# Patient Record
Sex: Female | Born: 1960 | Race: Black or African American | Hispanic: No | Marital: Single | Smoking: Current every day smoker
Health system: Southern US, Community
[De-identification: ages and names within clinical notes are randomized; demographics above are authoritative.]

## PROBLEM LIST (undated history)

## (undated) DIAGNOSIS — D649 Anemia, unspecified: Secondary | ICD-10-CM

## (undated) DIAGNOSIS — G8929 Other chronic pain: Secondary | ICD-10-CM

## (undated) DIAGNOSIS — F32A Depression, unspecified: Secondary | ICD-10-CM

## (undated) DIAGNOSIS — M25519 Pain in unspecified shoulder: Secondary | ICD-10-CM

## (undated) DIAGNOSIS — F419 Anxiety disorder, unspecified: Secondary | ICD-10-CM

## (undated) DIAGNOSIS — F329 Major depressive disorder, single episode, unspecified: Secondary | ICD-10-CM

## (undated) DIAGNOSIS — I10 Essential (primary) hypertension: Secondary | ICD-10-CM

## (undated) DIAGNOSIS — M549 Dorsalgia, unspecified: Secondary | ICD-10-CM

## (undated) DIAGNOSIS — E785 Hyperlipidemia, unspecified: Secondary | ICD-10-CM

## (undated) HISTORY — DX: Anxiety disorder, unspecified: F41.9

## (undated) HISTORY — DX: Pain in unspecified shoulder: M25.519

## (undated) HISTORY — DX: Essential (primary) hypertension: I10

## (undated) HISTORY — DX: Hyperlipidemia, unspecified: E78.5

## (undated) HISTORY — DX: Major depressive disorder, single episode, unspecified: F32.9

## (undated) HISTORY — DX: Other chronic pain: G89.29

## (undated) HISTORY — DX: Anemia, unspecified: D64.9

## (undated) HISTORY — DX: Depression, unspecified: F32.A

---

## 1989-09-13 HISTORY — PX: TUBAL LIGATION: SHX77

## 1994-09-13 HISTORY — PX: BREAST REDUCTION SURGERY: SHX8

## 1999-02-10 ENCOUNTER — Encounter: Payer: Self-pay | Admitting: Family Medicine

## 1999-02-10 ENCOUNTER — Ambulatory Visit (HOSPITAL_COMMUNITY): Admission: RE | Admit: 1999-02-10 | Discharge: 1999-02-10 | Payer: Self-pay | Admitting: Family Medicine

## 2002-09-13 HISTORY — PX: ROTATOR CUFF REPAIR: SHX139

## 2003-09-14 HISTORY — PX: ROTATOR CUFF REPAIR: SHX139

## 2005-05-12 ENCOUNTER — Encounter: Payer: Self-pay | Admitting: Internal Medicine

## 2005-09-13 HISTORY — PX: ROTATOR CUFF REPAIR: SHX139

## 2009-05-24 ENCOUNTER — Emergency Department (HOSPITAL_COMMUNITY): Admission: EM | Admit: 2009-05-24 | Discharge: 2009-05-25 | Payer: Self-pay | Admitting: Emergency Medicine

## 2009-05-26 ENCOUNTER — Encounter: Payer: Self-pay | Admitting: Internal Medicine

## 2009-05-27 ENCOUNTER — Encounter: Payer: Self-pay | Admitting: Internal Medicine

## 2009-05-27 ENCOUNTER — Observation Stay (HOSPITAL_COMMUNITY): Admission: EM | Admit: 2009-05-27 | Discharge: 2009-05-27 | Payer: Self-pay | Admitting: Emergency Medicine

## 2009-06-11 ENCOUNTER — Ambulatory Visit: Payer: Self-pay | Admitting: Infectious Diseases

## 2009-06-11 ENCOUNTER — Encounter: Payer: Self-pay | Admitting: Internal Medicine

## 2009-06-11 ENCOUNTER — Ambulatory Visit: Payer: Self-pay | Admitting: Internal Medicine

## 2009-06-11 DIAGNOSIS — Z9889 Other specified postprocedural states: Secondary | ICD-10-CM

## 2009-06-11 DIAGNOSIS — E119 Type 2 diabetes mellitus without complications: Secondary | ICD-10-CM | POA: Insufficient documentation

## 2009-06-11 DIAGNOSIS — I10 Essential (primary) hypertension: Secondary | ICD-10-CM | POA: Insufficient documentation

## 2009-06-11 LAB — CONVERTED CEMR LAB

## 2009-06-16 ENCOUNTER — Ambulatory Visit (HOSPITAL_COMMUNITY): Admission: RE | Admit: 2009-06-16 | Discharge: 2009-06-16 | Payer: Self-pay | Admitting: Internal Medicine

## 2009-06-16 LAB — HM MAMMOGRAPHY: HM Mammogram: NEGATIVE

## 2009-06-25 ENCOUNTER — Telehealth (INDEPENDENT_AMBULATORY_CARE_PROVIDER_SITE_OTHER): Payer: Self-pay | Admitting: *Deleted

## 2009-06-25 ENCOUNTER — Ambulatory Visit: Payer: Self-pay | Admitting: Internal Medicine

## 2009-07-01 ENCOUNTER — Ambulatory Visit: Payer: Self-pay | Admitting: Internal Medicine

## 2009-07-01 ENCOUNTER — Encounter: Payer: Self-pay | Admitting: Internal Medicine

## 2009-07-08 LAB — CONVERTED CEMR LAB
AST: 16 units/L (ref 0–37)
Alkaline Phosphatase: 70 units/L (ref 39–117)
BUN: 10 mg/dL (ref 6–23)
Cholesterol: 218 mg/dL — ABNORMAL HIGH (ref 0–200)
Microalb Creat Ratio: 4.1 mg/g (ref 0.0–30.0)
TSH: 0.655 microintl units/mL (ref 0.350–4.5)
Total Protein: 7.3 g/dL (ref 6.0–8.3)

## 2009-07-10 ENCOUNTER — Telehealth: Payer: Self-pay | Admitting: Internal Medicine

## 2009-07-24 ENCOUNTER — Ambulatory Visit: Payer: Self-pay | Admitting: Infectious Disease

## 2009-07-24 DIAGNOSIS — F329 Major depressive disorder, single episode, unspecified: Secondary | ICD-10-CM

## 2009-07-24 LAB — CONVERTED CEMR LAB: Hgb A1c MFr Bld: 6.6 %

## 2009-08-26 ENCOUNTER — Encounter: Payer: Self-pay | Admitting: Internal Medicine

## 2009-08-29 ENCOUNTER — Ambulatory Visit: Payer: Self-pay | Admitting: Infectious Diseases

## 2009-09-30 ENCOUNTER — Ambulatory Visit: Payer: Self-pay | Admitting: Internal Medicine

## 2009-09-30 ENCOUNTER — Encounter: Payer: Self-pay | Admitting: Internal Medicine

## 2009-10-02 ENCOUNTER — Encounter: Payer: Self-pay | Admitting: Internal Medicine

## 2009-10-21 ENCOUNTER — Encounter: Admission: RE | Admit: 2009-10-21 | Discharge: 2009-10-21 | Payer: Self-pay | Admitting: Internal Medicine

## 2009-11-03 ENCOUNTER — Telehealth: Payer: Self-pay | Admitting: Internal Medicine

## 2009-11-10 ENCOUNTER — Encounter: Payer: Self-pay | Admitting: Internal Medicine

## 2009-11-11 ENCOUNTER — Ambulatory Visit: Payer: Self-pay | Admitting: Internal Medicine

## 2009-11-11 LAB — CONVERTED CEMR LAB
Blood Glucose, Fingerstick: 96
Hgb A1c MFr Bld: 6.6 %

## 2009-12-24 ENCOUNTER — Encounter: Payer: Self-pay | Admitting: Internal Medicine

## 2009-12-24 ENCOUNTER — Ambulatory Visit: Payer: Self-pay | Admitting: Internal Medicine

## 2010-01-07 ENCOUNTER — Telehealth: Payer: Self-pay | Admitting: Internal Medicine

## 2010-01-14 ENCOUNTER — Ambulatory Visit: Payer: Self-pay | Admitting: Internal Medicine

## 2010-01-27 ENCOUNTER — Encounter: Payer: Self-pay | Admitting: Internal Medicine

## 2010-02-13 ENCOUNTER — Ambulatory Visit: Payer: Self-pay | Admitting: Internal Medicine

## 2010-02-13 DIAGNOSIS — E785 Hyperlipidemia, unspecified: Secondary | ICD-10-CM | POA: Insufficient documentation

## 2010-03-17 ENCOUNTER — Telehealth: Payer: Self-pay | Admitting: *Deleted

## 2010-03-17 ENCOUNTER — Ambulatory Visit: Payer: Self-pay | Admitting: Internal Medicine

## 2010-03-17 DIAGNOSIS — M25569 Pain in unspecified knee: Secondary | ICD-10-CM

## 2010-03-17 LAB — CONVERTED CEMR LAB
Calcium: 9.3 mg/dL (ref 8.4–10.5)
Cholesterol: 182 mg/dL (ref 0–200)
Creatinine, Ser: 0.65 mg/dL (ref 0.40–1.20)
Glucose, Bld: 159 mg/dL — ABNORMAL HIGH (ref 70–99)
HDL: 35 mg/dL — ABNORMAL LOW (ref >39)
Potassium: 3.8 meq/L (ref 3.5–5.3)
Sodium: 138 meq/L (ref 135–145)
Total CHOL/HDL Ratio: 5.2
Triglycerides: 168 mg/dL — ABNORMAL HIGH (ref <150)

## 2010-04-15 ENCOUNTER — Ambulatory Visit: Payer: Self-pay | Admitting: Internal Medicine

## 2010-04-15 ENCOUNTER — Encounter: Payer: Self-pay | Admitting: Internal Medicine

## 2010-04-20 ENCOUNTER — Telehealth: Payer: Self-pay | Admitting: Licensed Clinical Social Worker

## 2010-04-22 ENCOUNTER — Emergency Department (HOSPITAL_COMMUNITY): Admission: EM | Admit: 2010-04-22 | Discharge: 2010-04-22 | Payer: Self-pay | Admitting: Emergency Medicine

## 2010-04-28 ENCOUNTER — Encounter: Payer: Self-pay | Admitting: Internal Medicine

## 2010-04-30 ENCOUNTER — Emergency Department (HOSPITAL_COMMUNITY): Admission: EM | Admit: 2010-04-30 | Discharge: 2010-04-30 | Payer: Self-pay | Admitting: Emergency Medicine

## 2010-05-20 ENCOUNTER — Telehealth: Payer: Self-pay | Admitting: *Deleted

## 2010-05-21 ENCOUNTER — Encounter: Payer: Self-pay | Admitting: Internal Medicine

## 2010-06-02 ENCOUNTER — Ambulatory Visit: Payer: Self-pay | Admitting: Internal Medicine

## 2010-06-06 ENCOUNTER — Emergency Department (HOSPITAL_COMMUNITY): Admission: EM | Admit: 2010-06-06 | Discharge: 2010-06-06 | Payer: Self-pay | Admitting: Emergency Medicine

## 2010-06-12 ENCOUNTER — Telehealth: Payer: Self-pay | Admitting: *Deleted

## 2010-06-16 ENCOUNTER — Telehealth: Payer: Self-pay | Admitting: Internal Medicine

## 2010-06-18 ENCOUNTER — Encounter: Admission: RE | Admit: 2010-06-18 | Discharge: 2010-06-18 | Payer: Self-pay | Admitting: Unknown Physician Specialty

## 2010-06-18 ENCOUNTER — Encounter: Payer: Self-pay | Admitting: Internal Medicine

## 2010-06-22 ENCOUNTER — Ambulatory Visit: Payer: Self-pay | Admitting: Internal Medicine

## 2010-06-22 ENCOUNTER — Encounter: Payer: Self-pay | Admitting: Internal Medicine

## 2010-06-23 LAB — CONVERTED CEMR LAB
Benzodiazepines.: NEGATIVE
Cocaine Metabolites: NEGATIVE
Creatinine,U: 25.5 mg/dL
Marijuana Metabolite: NEGATIVE
Methadone: NEGATIVE

## 2010-06-30 ENCOUNTER — Encounter: Payer: Self-pay | Admitting: Internal Medicine

## 2010-08-10 ENCOUNTER — Encounter: Payer: Self-pay | Admitting: Internal Medicine

## 2010-08-17 ENCOUNTER — Telehealth: Payer: Self-pay | Admitting: Internal Medicine

## 2010-08-24 ENCOUNTER — Ambulatory Visit: Payer: Self-pay | Admitting: Internal Medicine

## 2010-08-24 LAB — CONVERTED CEMR LAB
ALT: 8 units/L (ref 0–35)
AST: 13 units/L (ref 0–37)
Albumin: 4.2 g/dL (ref 3.5–5.2)
Blood Glucose, Fingerstick: 103
Calcium: 9 mg/dL (ref 8.4–10.5)
Hgb A1c MFr Bld: 7 %

## 2010-09-21 ENCOUNTER — Telehealth: Payer: Self-pay | Admitting: Internal Medicine

## 2010-09-22 ENCOUNTER — Encounter: Payer: Self-pay | Admitting: Internal Medicine

## 2010-10-04 ENCOUNTER — Encounter: Payer: Self-pay | Admitting: Internal Medicine

## 2010-10-08 ENCOUNTER — Encounter: Payer: Self-pay | Admitting: *Deleted

## 2010-10-13 NOTE — Letter (Signed)
Summary: [facility]  [facility]   Imported By: Shon Hough 08/11/2010 11:39:31  _____________________________________________________________________  External Attachment:    Type:   Image     Comment:   External Document

## 2010-10-13 NOTE — Assessment & Plan Note (Signed)
Summary: SHOULDER/SB.   Vital Signs:  Patient profile:   50 year old female Height:      65 inches Temp:     97.6 degrees F oral BP sitting:   135 / 79  (right arm)  Vitals Entered By: Filomena Jungling NT II (June 02, 2010 9:24 AM) CC: NUMBNESS AND TINGLING IN RIGHT HAND  Is Patient Diabetic? Yes Did you bring your meter with you today? No Pain Assessment Patient in pain? yes     Location: SHOULDERS Intensity: 6 Type: aching Onset of pain  Chronic CBG Result 219  Does patient need assistance? Functional Status Self care Ambulation Normal   Primary Care Provider:  Lars Mage MD  CC:  NUMBNESS AND TINGLING IN RIGHT HAND .  History of Present Illness: Ms. 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 It started about one month ago, and she states has been getting increasingly bothersome. Pt is on percocet for pain management. Pt is on 60 tab per month, and states she has to take 3 per day as opposed to 2 per day. Pt is also on neurontin which was not on EMR, however pt states she takes 3 per day and helps the numbness to some degree. Pt also has had hx of steroid injections in the past and states it only helps for a couple of days.  2) Depression - pt was given a prescription for celexa, however she never took it. She states her depression stems from her pain, and does not believe she is actually depressed.     Preventive Screening-Counseling & Management  Alcohol-Tobacco     Alcohol drinks/day: 0     Smoking Status: current     Smoking Cessation Counseling: yes     Packs/Day: 2-3 cigs per day     Year Started: 1990     Pack years: 20  Caffeine-Diet-Exercise     Does Patient Exercise: yes     Type of exercise: walking- dogs  Current Medications (verified): 1)   Lisinopril 10 Mg Tabs (Lisinopril) .... Take 1 Tablet By Mouth Once A Day 2)  Pravachol 40 Mg Tabs (Pravastatin Sodium) .... Take 1 Pill By Mouth Daily. 3)  Metformin Hcl 500 Mg Tabs (Metformin Hcl) .... Take 1 Tablet By Mouth Two Times A Day 4)  Truetrack Test  Strp (Glucose Blood) .... Use To Check Blood Sugar 2 Hours After Meals 5)  Lancets  Misc (Lancets) .... Use To Check Blood Sugars 2 Hours After Meals 6)  Vitamin B-12 500 Mcg Tabs (Cyanocobalamin) .... Take 1 Tablet By Mouth Once A Day 7)  Percocet 7.5-500 Mg Tabs (Oxycodone-Acetaminophen) .... Take 1 Tablet Every 8 Hours As Needed For Pain 8)  Neurontin 100 Mg Caps (Gabapentin) .... Take 1 Tablet By Mouth Three Times A Day  Allergies (verified): 1)  ! Keflex  Past History:  Past Medical History: Last updated: 02/13/2010 Diabetes, well controlled on metformin HTN HLD chronic shoulder pain  Past Surgical History: Last updated: 04/15/2010 rotator cuff repair in 2004, 2005; left 2007  Family History: Last updated: 10-19-09 Mother died of cervical cancer father died of Stroke at age 58.  Social History: Last updated: 06/02/2010 Recently moved to [place] from [place]. 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 Single Current Smoker - 2  cig per day Drug use-no on disability Completed CNA and cosmetology courses after high school  Risk Factors: Alcohol Use: 0 (06/02/2010) Exercise: yes (06/02/2010)  Risk Factors: Smoking Status: current (06/02/2010) Packs/Day: 2-3 cigs per day (06/02/2010)  Social History: Recently moved to [place] from [place]. 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 Single Current Smoker - 2 cig per day Drug use-no on disability Completed CNA and cosmetology courses after high school  Review of Systems      See HPI  Physical Exam  General:  alert and well-developed.   Head:  normocephalic.   Neck:  supple.   Lungs:  normal respiratory effort and normal breath sounds.   Heart:   normal rate, regular rhythm, no murmur, no gallop, and no rub.   Abdomen:  soft, non-tender, and normal bowel sounds.   Msk:  decreased range of motion secondary to pain, cannot lift arms above head  Pulses:  pulses are equal 2+ DP Extremities:  no LE edema noted  Neurologic:  alert & oriented X3, cranial nerves II-XII intact, strength normal in all extremities, and sensation intact to light touch.    Diabetes Management Exam:    Foot Exam (with socks and/or shoes not present):       Sensory-Pinprick/Light touch:          Left medial foot (L-4): normal          Left dorsal foot (L-5): normal          Left lateral foot (S-1): normal          Right medial foot (L-4): normal          Right dorsal foot (L-5): normal          Right lateral foot (S-1): normal       Sensory-Monofilament:          Left foot: normal          Right foot: normal       Inspection:          Left foot: normal          Right foot: normal       Nails:          Left foot: normal          Right foot: normal   Impression & Recommendations:  Problem # 1:  ROTATOR CUFF REPAIR, HX OF (ICD-V45.89) Pt has significant history of bilateral rotator cuff tear s/p repair. Pt is followed by Dr. Ranell Patrick. Per pt, she states that Dr. Ranell Patrick states she needs a major surgery that would require altering bone in shoulder, and pt is not willing to pursue this procedure. She states she would like pain control only, and feels it is not sufficiently controlled and would like referral to pain management clinic. Will refer patient to pain management clinic. With regards to numbness and tingling symptoms, pt reports taking amitriptyline in past without relief, and gabapentin only relieves symptoms to some extent. Will double her dose of gabapentin (renal function is adequate) and start pt on trazodone for sleep aid.  Continue with percocet as well.   Orders: Pain Clinic Referral (Pain)  Problem # 2:  ESSENTIAL HYPERTENSION, BENIGN  (ICD-401.1) Blood pressure is at goal, continue current regimen. Renal function recently checked and at goal.   Her updated medication list for this problem includes:    Lisinopril 10 Mg Tabs (Lisinopril) .Marland Kitchen... Take 1 tablet by mouth once a day  BP today: 135/79 Prior BP: 134/89 (04/15/2010)  Labs Reviewed: K+: 3.8 (03/17/2010) Creat: : 0.65 (03/17/2010)   Chol: 182 (03/17/2010)   HDL: 35 (03/17/2010)   LDL: 113 (03/17/2010)   TG: 168 (03/17/2010)  Problem # 3:  HYPERLIPIDEMIA (ICD-272.4) Improved, continue current regimen.   Her updated medication list for this problem includes:    Pravachol 40 Mg Tabs (Pravastatin sodium) .Marland Kitchen... Take 1 pill by mouth daily.  Labs Reviewed: SGOT: 16 (07/01/2009)   SGPT: 13 (07/01/2009)   HDL:35 (03/17/2010), 35 (07/01/2009)  LDL:113 (03/17/2010), 139 (07/01/2009)  Chol:182 (03/17/2010), 218 (07/01/2009)  Trig:168 (03/17/2010), 220 (07/01/2009)  Problem # 4:  DM (ICD-250.00) No changes with regards to A1C. Continue current regimen. Pt has eye exam next month. Foot exam wnl.   Her updated medication list for this problem includes:    Lisinopril 10 Mg Tabs (Lisinopril) .Marland Kitchen... Take 1 tablet by mouth once a day    Metformin Hcl 500 Mg Tabs (Metformin hcl) .Marland Kitchen... Take 1 tablet by mouth two times a day  Orders: T- Capillary Blood Glucose (81191) T-Hgb A1C (in-house) ([id number])  Labs Reviewed: Creat: 0.65 (03/17/2010)    Reviewed HgBA1c results: 6.9 (02/13/2010)  6.6 (11/11/2009)  Problem # 5:  PREVENTIVE HEALTH CARE (ICD-V70.0) Flu shot GYN referral for PAP.   Problem # 6:  DEPRESSION, MAJOR (ICD-296.20) Pt states she feels more anxious and worried about her level of pain, and does not feel she is depressed. In reviewing her meds, pt has been on amitriptyline, paxil, fluoxetine, and celexa without relief or improvement in symptoms. Recently she does not even take any meds prescribed for depression. I explained to patient that anxiety can be treated  with an anti-depressant such as the ones she has been on in the past. Pt is not interested in anti-depressants currently. Thus will control symptoms, with increased gabapentin dose, percocet, and trazodone for sleep aid.   Complete Medication List: 1)  Lisinopril 10 Mg Tabs (Lisinopril) .... Take 1 tablet by mouth once a day 2)  Pravachol 40 Mg Tabs (Pravastatin sodium) .... Take 1 pill by mouth daily. 3)  Metformin Hcl 500 Mg Tabs (Metformin hcl) .... Take 1 tablet by mouth two times a day 4)  Truetrack Test Strp (Glucose blood) .... Use to check blood sugar 2 hours after meals 5)  Lancets Misc (Lancets) .... Use to check blood sugars 2 hours after meals 6)  Vitamin B-12 500 Mcg Tabs (Cyanocobalamin) .... Take 1 tablet by mouth once a day 7)  Percocet 7.5-500 Mg Tabs (Oxycodone-acetaminophen) .... Take 1 tablet every 8 hours as needed for pain 8)  Gabapentin 300 Mg Caps (Gabapentin) .... Take 1 tablet by mouth two times a day 9)  Trazodone Hcl 50 Mg Tabs (Trazodone hcl) .... Take 1 tab by mouth at bedtime  Other Orders: Gynecologic Referral (Gyn)  Patient Instructions: 1)  Please schedule a follow-up appointment in 3 months. Prescriptions: TRAZODONE HCL 50 MG TABS (TRAZODONE HCL) Take 1 tab by mouth at bedtime  #31 x 0   Entered and Authorized by:   Melida Quitter MD   Signed by:   Melida Quitter MD on 06/02/2010   Method used:   Electronically to        [facility] (retail)       [address]       [place], [place]  [address]       Ph: [phone]       Fax: [phone]   RxID:  [id number] GABAPENTIN 300 MG CAPS (GABAPENTIN) Take 1 tablet by mouth two times a day  #60 x 6   Entered and Authorized by:   Melida Quitter MD   Signed by:   Melida Quitter MD on 06/02/2010   Method used:   Electronically to        [facility] (retail)       [address]       [place], [place]  [address]       Ph: [phone]        Fax: [phone]   RxID:   [id number]   Prevention & Chronic Care Immunizations   Influenza vaccine: Not documented   Influenza vaccine deferral: Not indicated  (12/24/2009)   Influenza vaccine due: 05/14/2010    Tetanus booster: Not documented   Td booster deferral: Not indicated  (06/11/2009)   Tetanus booster due: 04/15/2020    Pneumococcal vaccine: Not documented  Other Screening   Pap smear: Not documented   Pap smear action/deferral: GYN Referral  (06/02/2010)    Mammogram: ASSESSMENT: Negative - BI-RADS 1^MM DIGITAL SCREENING  (06/16/2009)   Mammogram action/deferral: Ordered  (06/11/2009)   Smoking status: current  (06/02/2010)   Smoking cessation counseling: yes  (06/02/2010)  Diabetes Mellitus   HgbA1C: 6.9  (06/02/2010)   Hemoglobin A1C due: 10/16/2010    Eye exam: Not documented   Diabetic eye exam action/deferral: Ophthalmology referral  (04/15/2010)    Foot exam: yes  (06/02/2010)   Foot exam action/deferral: Do today   High risk foot: No  (04/15/2010)   Foot care education: Done  (04/15/2010)    Urine microalbumin/creatinine ratio: 4.1  (07/01/2009)   Urine microalbumin action/deferral: Deferred    Diabetes flowsheet reviewed?: Yes   Progress toward A1C goal: At goal  Lipids   Total Cholesterol: 182  (03/17/2010)   Lipid panel action/deferral: LDL Direct Ordered   LDL: 644  (03/17/2010)   LDL Direct: Not documented   HDL: 35  (03/17/2010)   Triglycerides: 168  (03/17/2010)    SGOT (AST): 16  (07/01/2009)   SGPT (ALT): 13  (07/01/2009)   Alkaline phosphatase: 70  (07/01/2009)   Total bilirubin: 0.3  (07/01/2009)    Lipid flowsheet reviewed?: Yes   Progress toward LDL goal: Improved  Hypertension   Last Blood Pressure: 135 / 79  (06/02/2010)   Serum creatinine: 0.65  (03/17/2010)   BMP action: Deferred   Serum potassium 3.8  (03/17/2010)    Hypertension flowsheet reviewed?: Yes   Progress toward BP goal: At goal  Self-Management  Support :   Personal Goals (by the next clinic visit) :     Personal A1C goal: 7  (06/11/2009)     Personal blood pressure goal: 140/90  (06/25/2009)     Personal LDL goal: 100  (06/11/2009)    Patient will work on the following items until the next clinic visit to reach self-care goals:     Medications and monitoring: take my medicines every day, check my blood sugar, examine my feet every day  (06/02/2010)     Eating: drink diet soda or water instead of juice or soda, eat more vegetables, use fresh or frozen vegetables, eat foods that are low in salt, eat baked foods instead of fried foods, eat fruit for snacks and desserts  (06/02/2010)     Activity: take a 30 minute walk every day  (06/02/2010)    Diabetes self-management support: Education handout,  Resources for patients handout  (06/02/2010)   Diabetes education handout printed   Last diabetes self-management training by diabetes educator: 06/11/2009    Hypertension self-management support: Education handout, Resources for patients handout  (06/02/2010)   Hypertension education handout printed    Lipid self-management support: Education handout, Resources for patients handout  (06/02/2010)     Lipid education handout printed      Resource handout printed.   Nursing Instructions: Give Flu vaccine today Gyn referral for screening Pap (see order)      Laboratory Results   Blood Tests   Date/Time Received: June 02, 2010 10:02 AM  Date/Time Reported: Burke Keels  June 02, 2010 10:02 AM   HGBA1C: 6.9%   (Normal Range: Non-Diabetic - 3-6%   Control Diabetic - 6-8%) CBG Random:: 219mg /dL  Comments: Patient had [employer] Burke Keels  June 02, 2010 10:03 AM      Appended Document: SHOULDER/SB.   Influenza Vaccine    Vaccine Type: Fluvax MCR    Site: right deltoid    Mfr: GlaxoSmithKline    Dose: 0.5 ml    Route: IM    Given by: Chinita Pester RN    Exp. Date: 03/13/2011    Lot #:  [id number]    VIS given: 04/07/10 version given June 02, 2010.  Flu Vaccine Consent Questions    Do you have a history of severe allergic reactions to this vaccine? no    Any prior history of allergic reactions to egg and/or gelatin? no    Do you have a sensitivity to the preservative Thimersol? no    Do you have a past history of Guillan-Barre Syndrome? no    Do you currently have an acute febrile illness? no    Have you ever had a severe reaction to latex? no    Vaccine information given and explained to patient? yes    Are you currently pregnant? no

## 2010-10-13 NOTE — Progress Notes (Signed)
Summary: refill, due 10/8/ hla  Phone Note Refill Request Message from:  Patient on June 12, 2010 11:36 AM  Refills Requested: Medication #1:  PERCOCET 7.5-500 MG TABS take 1 tablet every 8 hours as needed for pain   Dosage confirmed as above?Dosage Confirmed   Brand Name Necessary? No   Supply Requested: 1 month   Last Refilled: 9/8 last visit 9/20, no uds that i see, no pain contract  Initial call taken by: Marin Roberts RN,  June 12, 2010 11:36 AM  Follow-up for Phone Call        Please schedule an appointment with office doctor or me to get pain contract and UDS documented before next refill.  Thanks Follow-up by: Lars Mage MD,  June 15, 2010 12:04 PM

## 2010-10-13 NOTE — Assessment & Plan Note (Signed)
Summary: severe pain bil shoulders/pcp-garg/hla   Vital Signs:  Patient profile:   50 year old female Height:      65 inches Weight:      160.0 pounds BMI:     26.72 Temp:     98.1 degrees F oral Pulse rate:   92 / minute BP sitting:   135 / 88  (right arm)  Vitals Entered By: Filomena Jungling NT II (Jan 14, 2010 3:30 PM) CC: SHOULDER PAIN, Depression Is Patient Diabetic? Yes Did you bring your meter with you today? Yes Pain Assessment Patient in pain? yes     Location: BILATERAL SHOULDER Intensity: 6 Type: aching Onset of pain  2004 Nutritional Status BMI of 25 - 29 = overweight  Have you ever been in a relationship where you felt threatened, hurt or afraid?No   Does patient need assistance? Functional Status Self care Ambulation Normal   Primary Care Provider:  Lars Mage MD  CC:  SHOULDER PAIN and Depression.  History of Present Illness: 50 yo female with PMH outlined below presents to [facility] for regular follow up appointment. Her main concern today is continuous bilateral shoulder pain, 10/10 in severity, and relieved only by percocet. She has tried other meds in the past like Ibuprofen, tramadol, and diclofenac but none were helping. She is scheduled for PT next week and has seen ortho surgeon for the problem. She was told that she needs surgery for this but she is not ready for it yet. She has no other concerns at the time. No recent sicknesses or hospitalizaitons. No episodes of chest pain, SOB, palpitations. No specific abdominal or urinary concerns. No recent changes in appetite, weight, sleep patterns, mood.    Depression History:      The patient denies a depressed mood most of the day and a diminished interest in her usual daily activities.  The patient denies significant weight loss, significant weight gain, insomnia, hypersomnia, psychomotor agitation, psychomotor retardation, fatigue (loss of energy), feelings of worthlessness (guilt), impaired concentration  (indecisiveness), and recurrent thoughts of death or suicide.        The patient denies that she feels like life is not worth living, denies that she wishes that she were dead, and denies that she has thought about ending her life.         Preventive Screening-Counseling & Management  Alcohol-Tobacco     Smoking Status: current     Smoking Cessation Counseling: yes     Packs/Day: 3-4 cigs per day     Year Started: 1990     Pack years: 20  Problems Prior to Update: 1)  Preventive Health Care  (ICD-V70.0) 2)  Depression, Major  (ICD-296.20) 3)  Tobacco Abuse  (ICD-305.1) 4)  Rotator Cuff Repair, Hx of  (ICD-V45.89) 5)  Essential Hypertension, Benign  (ICD-401.1) 6)  Dm  (ICD-250.00)  Medications Prior to Update: 1)  Lisinopril 10 Mg Tabs (Lisinopril) .... Take 1 Tablet By Mouth Once A Day 2)  Pravachol 40 Mg Tabs (Pravastatin Sodium) .... Take 1 Pill By Mouth Daily. 3)  Metformin Hcl 500 Mg Tabs (Metformin Hcl) .... Take 1 Tablet By Mouth Once A Day 4)  Truetrack Test  Strp (Glucose Blood) .... Use To Check Blood Sugar 2 Hours After Meals 5)  Vicodin 5-500 Mg Tabs (Hydrocodone-Acetaminophen) .... Take 1 Tablet By Mouth As Needed For Pain At Bed Time. 6)  Diclofenac Sodium 75 Mg Tbec (Diclofenac Sodium) .... Take 1 Tablet By Mouth Two Times  A Day 7)  Lancets  Misc (Lancets) .... Use To Check Blood Sugars 2 Hours After Meals 8)  Paroxetine Hcl 10 Mg Tabs (Paroxetine Hcl) .... At Bedtime 9)  Gabapentin 300 Mg Caps (Gabapentin) .... Take 1 Tablet By Mouth Three Times A Day 10)  Vitamin B-12 500 Mcg Tabs (Cyanocobalamin) .... Take 1 Tablet By Mouth Once A Day 11)  Percocet 5-325 Mg Tabs (Oxycodone-Acetaminophen) .... Take 1 Tablet Up To 4 Times Per Day As Needed For Pain  Current Medications (verified): 1)  Lisinopril 10 Mg Tabs (Lisinopril) .... Take 1 Tablet By Mouth Once A Day 2)  Pravachol 40 Mg Tabs (Pravastatin Sodium) .... Take 1 Pill By Mouth Daily. 3)  Metformin Hcl 500 Mg Tabs  (Metformin Hcl) .... Take 1 Tablet By Mouth Once A Day 4)  Truetrack Test  Strp (Glucose Blood) .... Use To Check Blood Sugar 2 Hours After Meals 5)  Lancets  Misc (Lancets) .... Use To Check Blood Sugars 2 Hours After Meals 6)  Paroxetine Hcl 10 Mg Tabs (Paroxetine Hcl) .... At Bedtime 7)  Gabapentin 300 Mg Caps (Gabapentin) .... Take 1 Tablet By Mouth Three Times A Day 8)  Vitamin B-12 500 Mcg Tabs (Cyanocobalamin) .... Take 1 Tablet By Mouth Once A Day 9)  Percocet 5-325 Mg Tabs (Oxycodone-Acetaminophen) .... Take 1 Tablet Up To 4 Times Per Day As Needed For Pain  Allergies (verified): 1)  ! Keflex  Past History:  Past Surgical History: Last updated: 06/11/2009 rotator cuff repair in 2004.  Family History: Last updated: 10/27/09 Mother died of cervical cancer father died of Stroke at age 28.  Social History: Last updated: 10-27-09 Recently moved to [place] from [place]. 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 Mother died of cervical cancer father died of Stroke at age 48.  Social History: Reviewed history from 27-Oct-2009 and no changes required. Recently moved to [place] from [place]. 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 Lungs are clear to auscultation, no crackles or wheezes. Heart:  Normal rate and regular rhythm. S1 and S2 normal without gallop, murmur, click, rub or other extra sounds. Msk:  no joint swelling, no joint warmth, no redness over joints, and no joint deformities.  shoulder  pain on palpation.    Impression & Recommendations:  Problem # 1:  TOBACCO ABUSE (ICD-305.1)  Encouraged smoking cessation and discussed different methods for smoking cessation.   Problem # 2:  ROTATOR CUFF REPAIR, HX OF (ICD-V45.89) Pt has chronic shoulder pain and has tried different meds in NSAIDs class, ibuprofen, tramadol, diclofenac with no relief. She has pathology per MRI report, numerous tendinopathies bilaterally and rotator cuff tear. She will need surgery at one point but if she is not ready for it, I agree with medical management. I have discussed this case with Dr. Daiva Eves and we OK'd percocet 7.5 mg tablets #60 tabs per month. Will have pt sign pain contract.   Problem # 3:  ESSENTIAL HYPERTENSION, BENIGN (ICD-401.1) Well controlled, will cont the same regimen and will reassess on her next appointment and will readjust the regimen if indiacted.  Her updated medication list for this problem includes:    Lisinopril 10 Mg Tabs (Lisinopril) .Marland Kitchen... Take 1 tablet by mouth once a day  BP today: 135/88 Prior BP: 111/79 (12/24/2009)  Labs Reviewed: K+: 4.0 (07/01/2009) Creat: : 0.75 (07/01/2009)   Chol: 218 (07/01/2009)   HDL: 35 (07/01/2009)   LDL: 139 (07/01/2009)   TG: 220 (07/01/2009)  Problem # 4:  DM (ICD-250.00) At goal, will cont the same regimen.  Her updated medication list for this problem includes:    Lisinopril 10 Mg Tabs (Lisinopril) .Marland Kitchen... Take 1 tablet by mouth once a day    Metformin Hcl 500 Mg Tabs (Metformin hcl) .Marland Kitchen... Take 1 tablet by mouth once a day  Labs Reviewed: Creat: 0.75 (07/01/2009)    Reviewed HgBA1c results: 6.6 (11/11/2009)  6.6 (07/24/2009)  Complete Medication List: 1)  Lisinopril 10 Mg Tabs (Lisinopril) .... Take 1 tablet by mouth once a day 2)  Pravachol 40 Mg Tabs (Pravastatin sodium) .... Take 1 pill by mouth daily. 3)  Metformin Hcl 500 Mg Tabs (Metformin hcl) .... Take 1 tablet by mouth once a day 4)  Truetrack Test Strp (Glucose  blood) .... Use to check blood sugar 2 hours after meals 5)  Lancets Misc (Lancets) .... Use to check blood sugars 2 hours after meals 6)  Paroxetine Hcl 10 Mg Tabs (Paroxetine hcl) .... At bedtime 7)  Gabapentin 300 Mg Caps (Gabapentin) .... Take 1 tablet by mouth three times a day 8)  Vitamin B-12 500 Mcg Tabs (Cyanocobalamin) .... Take 1 tablet by mouth once a day 9)  Percocet 7.5-500 Mg Tabs (Oxycodone-acetaminophen) .... Take 1 tablet every 8 hours as needed for pain  Patient Instructions: 1)  Please schedule a follow-up appointment in 3 months. 2)  Please check your blood pressure regularly, if it is >170 please call clinic at [phone] Prescriptions: PERCOCET 7.5-500 MG TABS (OXYCODONE-ACETAMINOPHEN) take 1 tablet every 8 hours as needed for pain  #60 x 0   Entered by:   Mliss Sax MD   Authorized by:   Laren Everts MD   Signed by:   Mliss Sax MD on 01/14/2010   Method used:   Print then Give to Patient   RxID:   [id number]

## 2010-10-13 NOTE — Progress Notes (Signed)
Summary: Smoking Cessation  Phone Note Outgoing Call   Summary of Call: 8/8  Left message for patient to call me.    9/9  Called home and person there said she would be home late afternoon.  I will send her a letter in the mail to call me.

## 2010-10-13 NOTE — Letter (Signed)
[facility]   Imported By: Margie Billet 01/15/2010 12:37:08  _____________________________________________________________________  External Attachment:    Type:   Image     Comment:   External Document

## 2010-10-13 NOTE — Consult Note (Signed)
Summary: [facility]  [facility]   Imported By: Florinda Marker 10/02/2009 15:30:26  _____________________________________________________________________  External Attachment:    Type:   Image     Comment:   External Document

## 2010-10-13 NOTE — Assessment & Plan Note (Signed)
Summary: CHECKUP/ TRANSPORTATION /SB.   Vital Signs:  Patient profile:   50 year old female Height:      65 inches (165.10 cm) Weight:      159.8 pounds (72.64 kg) BMI:     26.69 Temp:     97.2 degrees F (36.22 degrees C) oral Pulse rate:   85 / minute BP sitting:   144 / 88  (right arm)  Vitals Entered By: Stanton Kidney Ditzler RN (November 11, 2009 10:14 AM) Is Patient Diabetic? Yes Did you bring your meter with you today? No Pain Assessment Patient in pain? yes     Location: neck and shoulders Intensity: 6 Type: sharp Onset of pain  years Nutritional Status BMI of 25 - 29 = overweight Nutritional Status Detail appetite good CBG Result 96  Have you ever been in a relationship where you felt threatened, hurt or afraid?denies   Does patient need assistance? Functional Status Self care Ambulation Normal Comments Ck-up and refills on meds.   Primary Care Provider:  Lars Mage MD   History of Present Illness: Patient is a 50 year old lady with PMH as described in EMR. She comes in today for regular follow up and medication refill. She says that the pain is still 6/10 in her bilateralshoulder joints. We discussed about the MRI results and she has an appointment to see an orthopedic on Thursday of this week.  Her blood pressure is again high today and she sasy that she usually checks it at home and it is 130/80's range. I checked it manually and it was 148 systolic.  Gynecological referral pending for April 6th.  Not ready for quitting smoking at this time.  No other complaints.  Depression History:      The patient denies a depressed mood most of the day and a diminished interest in her usual daily activities.         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 Take 1 Tablet By Mouth Once A Day 2)  Gabapentin 300 Mg Caps (Gabapentin) .... Take 1 Tablet By Mouth Three Times A Day 3)  Truetrack Test  Strp (Glucose Blood) .... Use To Check Blood Sugar 2 Hours After Meals 4)  Lancets  Misc (Lancets) .... Use To Check Blood Sugars 2 Hours After Meals 5)  Pravachol 40 Mg Tabs (Pravastatin Sodium) .... Take 1 Pill By Mouth Daily. 6)  Metformin Hcl 500 Mg Tabs (Metformin Hcl) .... Take 1 Tablet By Mouth Once A Day 7)  Paroxetine Hcl 10 Mg Tabs (Paroxetine Hcl) .... At Bedtime 8)  Vicodin 5-500 Mg Tabs (Hydrocodone-Acetaminophen) .... Take 1 Tablet By Mouth As Needed For Pain At Bed Time.  Current Medications (verified): 1)  Vitamin B-12 500 Mcg Tabs (Cyanocobalamin) .... Take 1 Tablet By Mouth Once A Day 2)  Gabapentin 300 Mg Caps (Gabapentin) .... Take 1 Tablet By Mouth Three Times A Day 3)  Truetrack Test  Strp (Glucose Blood) .... Use To Check Blood Sugar 2 Hours After Meals 4)  Lancets  Misc (Lancets) .... Use To Check Blood Sugars 2 Hours After Meals 5)  Pravachol 40 Mg Tabs (Pravastatin Sodium) .... Take 1 Pill By Mouth Daily. 6)  Metformin Hcl 500 Mg Tabs (Metformin Hcl) .... Take 1 Tablet By Mouth Once A Day 7)  Paroxetine Hcl 10 Mg Tabs (Paroxetine Hcl) .... At Bedtime 8)  Vicodin 5-500 Mg Tabs (Hydrocodone-Acetaminophen) .... Take 1 Tablet By Mouth As Needed For Pain At Bed Time. 9)  Diclofenac Sodium 75 Mg Tbec (Diclofenac Sodium) .... Take 1 Tablet By Mouth Two Times A Day 10)  Lisinopril 10 Mg Tabs (Lisinopril) .... Take 1 Tablet By Mouth Once A Day  Allergies: 1)  ! Keflex  Past History:  Past Surgical History: Last updated: 06/11/2009 rotator cuff repair  in 2004.  Family History: Last updated: Oct 25, 2009 Mother died of cervical cancer father died of Stroke at age 40.  Social History: Last updated: 2009/10/25 Recently moved to [place] from [place]. 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 Single Current Smoker Drug use-no  Risk Factors: Smoking Status: current (11/11/2009) Packs/Day: 3-4 cigs per day (11/11/2009)  Social History: Packs/Day:  3-4 cigs per day  Review of Systems      See HPI  Physical Exam  Additional Exam:  Gen: AOx3, in no acute distress Eyes: PERRL, EOMI ENT:MMM, No erythema noted in posterior pharynx Neck: No JVD, No LAP Chest: CTAB with  good respiratory effort CVS: regular rhythmic rate, NO M/R/G, S1 S2 normal Abdo: soft,ND, BS+x4, Non tender and No hepatosplenomegaly EXT: No odema noted Neuro: Non focal, gait is normal Skin: no rashes noted.    Impression & Recommendations:  Problem # 1:  ROTATOR CUFF REPAIR, HX OF (ICD-V45.89) Assessment Unchanged Patient has an appointmnet with orthopedic on Thursday. 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 We discussed in detail the pain issue with her and Dr Josem Kaufmann and decided not toprescribe her any more narcotics from next timeonwards and try out all the classes of NSAIDS for pain control. She has already failed Ibuprofen 800 mg. I will start her Diclofenac 75mg  two times a day for pain control and Vicodin 5 at night for break through pain if she is unable to sleep. We will follow up in 2 weeks with orthopedics referal review.  Problem # 2:  ESSENTIAL HYPERTENSION, BENIGN (ICD-401.1) Assessment: Unchanged Will start her on Lisinopril 10 mg once daily and check K and Crt in 2 weeks time. The following medications were removed from the medication list:    Hydrochlorothiazide 25 Mg Tabs (Hydrochlorothiazide) .Marland Kitchen... Take 1/2 tab once a day. Her updated medication list for this problem  includes:    Lisinopril 10 Mg Tabs (Lisinopril) .Marland Kitchen... Take 1 tablet by mouth once a day  Future Orders: T-Basic Metabolic Panel [id number]) ... 11/25/2009  BP today: 144/88 Prior BP: 145/87 (Oct 25, 2009)  Labs Reviewed: K+: 4.0 (07/01/2009) Creat: : 0.75 (07/01/2009)   Chol: 218 (07/01/2009)   HDL: 35 (07/01/2009)   LDL: 139 (07/01/2009)   TG: 220 (07/01/2009)  Problem # 3:  TOBACCO ABUSE (ICD-305.1) Not interested in quitting.  Problem # 4:  DM (ICD-250.00) HBA1c 6.6 today. wellcontrolled on current meds. Her updated medication list for this problem includes:    Metformin Hcl 500 Mg Tabs (Metformin hcl) .Marland Kitchen... Take 1 tablet by mouth once a day    Lisinopril 10  Mg Tabs (Lisinopril) .Marland Kitchen... Take 1 tablet by mouth once a day  Orders: T- Capillary Blood Glucose ([id number]) T-Hgb A1C (in-house) ([id number])  Labs Reviewed: Creat: 0.75 (07/01/2009)    Reviewed HgBA1c results: 6.6 (11/11/2009)  6.6 (07/24/2009)  Problem # 5:  DEPRESSION, MAJOR (ICD-296.20) Patient was advised to start taking Paxil as advised last visit. She already has prescriptions for that.  Complete Medication List: 1)  Vitamin B-12 500 Mcg Tabs (Cyanocobalamin) .... Take 1 tablet by mouth once a day 2)  Gabapentin 300 Mg Caps (Gabapentin) .... Take 1 tablet by mouth three times a day 3)  Truetrack Test Strp (Glucose blood) .... Use to check blood sugar 2 hours after meals 4)  Lancets Misc (Lancets) .... Use to check blood sugars 2 hours after meals 5)  Pravachol 40 Mg Tabs (Pravastatin sodium) .... Take 1 pill by mouth daily. 6)  Metformin Hcl 500 Mg Tabs (Metformin hcl) .... Take 1 tablet by mouth once a day 7)  Paroxetine Hcl 10 Mg Tabs (Paroxetine hcl) .... At bedtime 8)  Vicodin 5-500 Mg Tabs (Hydrocodone-acetaminophen) .... Take 1 tablet by mouth as needed for pain at bed time. 9)  Diclofenac Sodium 75 Mg Tbec (Diclofenac sodium) .... Take 1 tablet by mouth two times a day 10)  Lisinopril 10 Mg Tabs (Lisinopril)  .... Take 1 tablet by mouth once a day  Patient Instructions: 1)  Tobacco is very bad for your health and your loved ones! You Should stop smoking!. 2)  Stop Smoking Tips: Choose a Quit date. Cut down before the Quit date. decide what you will do as a substitute when you feel the urge to smoke(gum,toothpick,exercise). 3)  It is important that you exercise regularly at least 20 minutes 5 times a week. If you develop chest pain, have severe difficulty breathing, or feel very tired , stop exercising immediately and seek medical attention. 4)  You need to lose weight. Consider a lower calorie diet and regular exercise.  5)  You need to have a Pap Smear to prevent cervical cancer. 6)  Check your blood sugars regularly. If your readings are usually above :200 or below 70 you should contact our office. 7)  It is important that your Diabetic A1c level is checked every 3 months. 8)  See your eye doctor yearly to check for diabetic eye damage. 9)  Check your feet each night for sore areas, calluses or signs of infection. 10)  Check your Blood Pressure regularly. If it is above: 130/80 you should make an appointment. Prescriptions: VICODIN 5-500 MG TABS (HYDROCODONE-ACETAMINOPHEN) Take 1 tablet by mouth as needed for pain at bed time.  #20 x 0   Entered and Authorized by:   Lars Mage MD   Signed by:   Lars Mage MD on 11/11/2009   Method used:   Print then Give to Patient   RxID:   [id number] LISINOPRIL 10 MG TABS (LISINOPRIL) Take 1 tablet by mouth once a day  #30 x 11   Entered and Authorized by:   Lars Mage MD   Signed by:   Lars Mage MD on 11/11/2009   Method used:   Electronically to        [facility] (retail)       [address]       [place], [place]  [address]       Ph: [phone]       Fax: [phone]  RxID:   [id number] HYDROCHLOROTHIAZIDE 25 MG TABS (HYDROCHLOROTHIAZIDE) take 1/2 tab once a day.  #30 x 11   Entered and Authorized by:    Lars Mage MD   Signed by:   Lars Mage MD on 11/11/2009   Method used:   Electronically to        [facility] (retail)       [address]       [place], [place]  [address]       Ph: [phone]       Fax: [phone]   RxID:   [id number] DICLOFENAC SODIUM 75 MG TBEC (DICLOFENAC SODIUM) Take 1 tablet by mouth two times a day  #60 x 1   Entered and Authorized by:   Lars Mage MD   Signed by:   Lars Mage MD on 11/11/2009   Method used:   Electronically to        [facility] (retail)       [address]       [place], [place]  [address]       Ph: [phone]       Fax: [phone]   RxID:   [id number] VICODIN 5-500 MG TABS (HYDROCODONE-ACETAMINOPHEN) Take 1 tablet by mouth as needed for pain at bed time.  #20 x 0   Entered and Authorized by:   Lars Mage MD   Signed by:   Lars Mage MD on 11/11/2009   Method used:   Print then Give to Patient   RxID:   [id number] DICLOFENAC SODIUM 75 MG TBEC (DICLOFENAC SODIUM) Take 1 tablet by mouth two times a day  #60 x 1   Entered and Authorized by:   Lars Mage MD   Signed by:   Lars Mage MD on 11/11/2009   Method used:   Electronically to        [facility] (retail)       [address]       [place], [place]  [address]       Ph: [phone]       Fax: [phone]   RxID:   [id number] HYDROCHLOROTHIAZIDE 25 MG TABS (HYDROCHLOROTHIAZIDE) take 1/2 tab once a day.  #30 x 11   Entered and Authorized by:   Lars Mage MD   Signed by:   Lars Mage MD on 11/11/2009   Method used:   Electronically to        [facility] (retail)       [address]       [place], [place]  [address]       Ph: [phone]       Fax: [phone]   RxID:   [id number]  Process Orders Check Orders Results:     [facility]: Check successful Tests Sent for requisitioning (November 11, 2009 11:26  AM):     11/25/2009: [facility] -- T-Basic Metabolic Panel [id number] (signed)    Prevention & Chronic Care Immunizations   Influenza vaccine: Not documented   Influenza vaccine deferral: Deferred  (11/11/2009)    Tetanus booster: Not documented   Td booster deferral: Not indicated  (06/11/2009)    Pneumococcal vaccine: Not documented  Other Screening   Pap smear: Not documented   Pap  smear action/deferral: GYN referral  (09/30/2009)    Mammogram: ASSESSMENT: Negative - BI-RADS 1^MM DIGITAL SCREENING  (06/16/2009)   Mammogram action/deferral: Ordered  (06/11/2009)   Smoking status: current  (11/11/2009)   Smoking cessation counseling: yes  (11/11/2009)  Diabetes Mellitus   HgbA1C: 6.6  (11/11/2009)    Eye exam: Not documented   Diabetic eye exam action/deferral: Ophthalmology referral  (06/11/2009)    Foot exam: Not documented   Foot exam action/deferral: Do today   High risk foot: Not documented   Foot care education: Not documented    Urine microalbumin/creatinine ratio: 4.1  (07/01/2009)   Urine microalbumin action/deferral: Deferred    Diabetes flowsheet reviewed?: Yes   Progress toward A1C goal: At goal  Lipids   Total Cholesterol: 218  (07/01/2009)   Lipid panel action/deferral: Not indicated   LDL: 139  (07/01/2009)   LDL Direct: Not documented   HDL: 35  (07/01/2009)   Triglycerides: 220  (07/01/2009)  Hypertension   Last Blood Pressure: 144 / 88  (11/11/2009)   Serum creatinine: 0.75  (07/01/2009)   BMP action: Deferred   Serum potassium 4.0  (07/01/2009)    Hypertension flowsheet reviewed?: Yes   Progress toward BP goal: Unchanged  Self-Management Support :   Personal Goals (by the next clinic visit) :     Personal A1C goal: 7  (06/11/2009)     Personal blood pressure goal: 140/90  (06/25/2009)     Personal LDL goal: 100  (06/11/2009)    Patient will work on the following items until the next clinic visit to reach  self-care goals:     Medications and monitoring: take my medicines every day, check my blood sugar, check my blood pressure, bring all of my medications to every visit, examine my feet every day  (11/11/2009)     Eating: eat more vegetables, use fresh or frozen vegetables, eat foods that are low in salt, eat baked foods instead of fried foods, eat fruit for snacks and desserts, limit or avoid alcohol  (11/11/2009)     Activity: take a 30 minute walk every day, take the stairs instead of the elevator, park at the far end of the parking lot  (11/11/2009)    Diabetes self-management support: Education handout, Written self-care plan, Resources for patients handout  (11/11/2009)   Diabetes care plan printed   Diabetes education handout printed   Last diabetes self-management training by diabetes educator: 06/11/2009    Hypertension self-management support: Written self-care plan, Education handout, Resources for patients handout  (11/11/2009)   Hypertension self-care plan printed.   Hypertension education handout printed      Resource handout printed.  Laboratory Results   Blood Tests   Date/Time Received: November 11, 2009 10:35 AM Date/Time Reported: Alric Quan  November 11, 2009 10:35 AM   HGBA1C: 6.6%   (Normal Range: Non-Diabetic - 3-6%   Control Diabetic - 6-8%) CBG Random:: 96mg /dL

## 2010-10-13 NOTE — Progress Notes (Signed)
Summary: Refill/gh  Phone Note Refill Request Message from:  Fax from Pharmacy on August 17, 2010 10:41 AM  Refills Requested: Medication #1:  AMITRIPTYLINE HCL 50 MG TABS at bedtime.   Dosage confirmed as above?Dosage Confirmed   Brand Name Necessary? No   Supply Requested: 1 year   Last Refilled: 07/20/2010  Method Requested: Electronic Initial call taken by: Angelina Ok RN,  August 17, 2010 10:47 AM  Follow-up for Phone Call        Rx faxed to pharmacy Follow-up by: Lars Mage MD,  August 17, 2010 12:47 PM    Prescriptions: AMITRIPTYLINE HCL 50 MG TABS (AMITRIPTYLINE HCL) at bedtime  #30 x 12   Entered and Authorized by:   Lars Mage MD   Signed by:   Lars Mage MD on 08/17/2010   Method used:   Electronically to        [facility] (retail)       [address]       [place], [place]  [address]       Ph: [phone]       Fax: [phone]   RxID:   [id number]

## 2010-10-13 NOTE — Assessment & Plan Note (Signed)
Summary: ACUTE-SHOULDER PAIN/CFB/(GARG)   Vital Signs:  Patient profile:   50 year old female Height:      65 inches (165.10 cm) Weight:      160.2 pounds (72.82 kg) BMI:     26.76 Temp:     96.9 degrees F oral Pulse rate:   89 / minute BP sitting:   132 / 92  (right arm)  Vitals Entered By: Chinita Pester RN (February 13, 2010 10:22 AM) CC: Bilateral shoulders pain. Pt. states she does not take Metformin but her CBG's are never high. Is Patient Diabetic? Yes Did you bring your meter with you today? No Pain Assessment Patient in pain? yes     Location: shoulders Intensity: 6 Type: sharp Onset of pain  Chronic Nutritional Status BMI of 25 - 29 = overweight CBG Result 200  Have you ever been in a relationship where you felt threatened, hurt or afraid?No   Does patient need assistance? Functional Status Self care Ambulation Normal   Primary Care Provider:  Lars Mage MD  CC:  Bilateral shoulders pain. Pt. states she does not take Metformin but her CBG's are never high..  History of Present Illness: Kaylee Murphy is a 50 yo woman with PMH as outlined below.  She is here for pain med refill.  She was evaluated here for shoulder pain.  Has h/o rotator cuff problems and prior surgery.  She had an MRI done here and referred to Dr. Ranell Patrick from ortho.  He evaluated pt and recommended non-opiate pain management and PT.  However, this did not work and per Dr. Clabe Seal note, she discussed issue with Dr. Daiva Eves and agreed to start percocet and is doing better.  Otherwise no complaints.   Depression History:      The patient denies a depressed mood most of the day and a diminished interest in her usual daily activities.        Comments:  "The pain causes me to be depressed.".   Preventive Screening-Counseling & Management  Alcohol-Tobacco     Alcohol drinks/day: 0     Smoking Status: current     Smoking Cessation Counseling: yes     Packs/Day: 2-3 cigs per day     Year Started: 1990    Pack years: 20  Caffeine-Diet-Exercise     Does Patient Exercise: yes     Type of exercise: walking- dogs  Current Medications (verified): 1)  Lisinopril 10 Mg Tabs (Lisinopril) .... Take 1 Tablet By Mouth Once A Day 2)  Pravachol 40 Mg Tabs (Pravastatin Sodium) .... Take 1 Pill By Mouth Daily. 3)  Metformin Hcl 500 Mg Tabs (Metformin Hcl) .... Take 1 Tablet By Mouth Once A Day 4)  Truetrack Test  Strp (Glucose Blood) .... Use To Check Blood Sugar 2 Hours After Meals 5)  Lancets  Misc (Lancets) .... Use To Check Blood Sugars 2 Hours After Meals 6)  Vitamin B-12 500 Mcg Tabs (Cyanocobalamin) .... Take 1 Tablet By Mouth Once A Day 7)  Percocet 7.5-500 Mg Tabs (Oxycodone-Acetaminophen) .... Take 1 Tablet Every 8 Hours As Needed For Pain  Allergies (verified): 1)  ! Keflex  Past History:  Past Surgical History: Last updated: 06/11/2009 rotator cuff repair in 2004.  Family History: Last updated: 2009-10-05 Mother died of cervical cancer father died of Stroke at age 64.  Social History: Last updated: 10/05/2009 Recently moved to [place] from [place]. 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 Single Current Smoker Drug use-no  Risk Factors:  Smoking Status: current (02/13/2010) Packs/Day: 2-3 cigs per day (02/13/2010)  Past Medical History: Diabetes, well controlled on metformin HTN HLD chronic shoulder pain  Social History: Packs/Day:  2-3 cigs per day Does Patient Exercise:  yes  Review of Systems      See HPI  Physical Exam  General:  alert, normal appearance, and cooperative to examination.   Eyes:  pupils equal, pupils round, and pupils reactive to light.  anicteric Neck:  supple, no JVD, and no carotid bruits.   Lungs:  normal respiratory effort, no accessory muscle use, normal breath sounds, no crackles, and no wheezes.   Heart:  normal rate, regular rhythm, no murmur, no gallop, no rub, and no JVD.   Abdomen:  soft, non-tender, and normal bowel sounds.     Extremities:  no edema Neurologic:  alert & oriented X3 and gait normal.   Psych:  Oriented X3, memory intact for recent and remote, and normally interactive.     Impression & Recommendations:  Problem # 1:  ESSENTIAL HYPERTENSION, BENIGN (ICD-401.1) not at goal not taking lisinopril but 3-4/week advised on regular use and importance of good BP control in setting of DM  Her updated medication list for this problem includes:    Lisinopril 10 Mg Tabs (Lisinopril) .Marland Kitchen... Take 1 tablet by mouth once a day  BP today: 132/92 Prior BP: 135/88 (01/14/2010)  Labs Reviewed: K+: 4.0 (07/01/2009) Creat: : 0.75 (07/01/2009)   Chol: 218 (07/01/2009)   HDL: 35 (07/01/2009)   LDL: 139 (07/01/2009)   TG: 220 (07/01/2009)  Problem # 2:  DEPRESSION, MAJOR (ICD-296.20) not taking paroxetine due to feeling nervous and unable to sleep was taking at night offerred trial of taking it in the morning vs other medication....reports mood is not too bad and prefers to hold off no SI/HI and agrees to call hotline, clinic or ED if any changes.   Problem # 3:  DM (ICD-250.00)  A1c slightly higher but at goal pt reports elevated sugars after intraarticular steroid injection....may have contributed will recheck in 3 months, if elevated, increase metformin to 1000mg  two times a day. refer for eye exam  Her updated medication list for this problem includes:    Lisinopril 10 Mg Tabs (Lisinopril) .Marland Kitchen... Take 1 tablet by mouth once a day    Metformin Hcl 500 Mg Tabs (Metformin hcl) .Marland Kitchen... Take 1 tablet by mouth once a day  Orders: T- Capillary Blood Glucose (29562) T-Hgb A1C (in-house) (13086VH) Ophthalmology Referral (Ophthalmology) T-Lipoprotein (LDL cholesterol)  [id number])  Labs Reviewed: Creat: 0.75 (07/01/2009)    Reviewed HgBA1c results: 6.9 (02/13/2010)  6.6 (11/11/2009)  Problem # 4:  ROTATOR CUFF REPAIR, HX OF (ICD-V45.89) will refill percocet per prior notes....pain contract in  place  Problem # 5:  HYPERLIPIDEMIA (ICD-272.4)  no at goal will check direct LDL since pt is not fasting if not at goal, will recheck lipid panel while fasting and if elevated, increase pravachol (also confirm with pharmacy prior to)  Her updated medication list for this problem includes:    Pravachol 40 Mg Tabs (Pravastatin sodium) .Marland Kitchen... Take 1 pill by mouth daily.  Labs Reviewed: SGOT: 16 (07/01/2009)   SGPT: 13 (07/01/2009)   HDL:35 (07/01/2009)  LDL:139 (07/01/2009)  Chol:218 (07/01/2009)  Trig:220 (07/01/2009)  Orders: T-Lipoprotein (LDL cholesterol)  ([id number]) T-Comprehensive Metabolic Panel ([id number])  Complete Medication List: 1)  Lisinopril 10 Mg Tabs (Lisinopril) .... Take 1 tablet by mouth once a day 2)  Pravachol 40 Mg Tabs (Pravastatin sodium) .... Take  1 pill by mouth daily. 3)  Metformin Hcl 500 Mg Tabs (Metformin hcl) .... Take 1 tablet by mouth once a day 4)  Truetrack Test Strp (Glucose blood) .... Use to check blood sugar 2 hours after meals 5)  Lancets Misc (Lancets) .... Use to check blood sugars 2 hours after meals 6)  Vitamin B-12 500 Mcg Tabs (Cyanocobalamin) .... Take 1 tablet by mouth once a day 7)  Percocet 7.5-500 Mg Tabs (Oxycodone-acetaminophen) .... Take 1 tablet every 8 hours as needed for pain  Patient Instructions: 1)  Please schedule a follow-up appointment in 3 months. 2)  make sure to take lisinopril everyday 3)  continue mediations below. 4)  have given refills as discussed 5)  remember if there is any change in your mood or thoughts of hurting yourself to call clinic, hotline or go to ED. 6)  If you have any problems before your next visit, call clinic.  Prescriptions: PERCOCET 7.5-500 MG TABS (OXYCODONE-ACETAMINOPHEN) take 1 tablet every 8 hours as needed for pain  #60 x 0   Entered and Authorized by:   Mariea Stable MD   Signed by:   Mariea Stable MD on 02/13/2010   Method used:   Print then Give to Patient   RxID:    [id number] METFORMIN HCL 500 MG TABS (METFORMIN HCL) Take 1 tablet by mouth once a day  #30 x 3   Entered and Authorized by:   Mariea Stable MD   Signed by:   Mariea Stable MD on 02/13/2010   Method used:   Print then Give to Patient   RxID:   [id number]   Prevention & Chronic Care Immunizations   Influenza vaccine: Not documented   Influenza vaccine deferral: Not indicated  (12/24/2009)    Tetanus booster: Not documented   Td booster deferral: Not indicated  (06/11/2009)    Pneumococcal vaccine: Not documented  Other Screening   Pap smear: Not documented   Pap smear action/deferral: Deferred-3 yr interval  (12/24/2009)    Mammogram: ASSESSMENT: Negative - BI-RADS 1^MM DIGITAL SCREENING  (06/16/2009)   Mammogram action/deferral: Ordered  (06/11/2009)   Smoking status: current  (02/13/2010)   Smoking cessation counseling: yes  (02/13/2010)  Diabetes Mellitus   HgbA1C: 6.9  (02/13/2010)    Eye exam: Not documented   Diabetic eye exam action/deferral: Ophthalmology referral  (02/13/2010)    Foot exam: Not documented   Foot exam action/deferral: Do today   High risk foot: Not documented   Foot care education: Not documented    Urine microalbumin/creatinine ratio: 4.1  (07/01/2009)   Urine microalbumin action/deferral: Deferred    Diabetes flowsheet reviewed?: Yes   Progress toward A1C goal: At goal  Lipids   Total Cholesterol: 218  (07/01/2009)   Lipid panel action/deferral: LDL Direct Ordered   LDL: 595  (07/01/2009)   LDL Direct: Not documented   HDL: 35  (07/01/2009)   Triglycerides: 220  (07/01/2009)    SGOT (AST): 16  (07/01/2009)   SGPT (ALT): 13  (07/01/2009) CMP ordered    Alkaline phosphatase: 70  (07/01/2009)   Total bilirubin: 0.3  (07/01/2009)  Hypertension   Last Blood Pressure: 132 / 92  (02/13/2010)   Serum creatinine: 0.75  (07/01/2009)   BMP action: Deferred   Serum potassium 4.0  (07/01/2009) CMP ordered      Hypertension flowsheet reviewed?: Yes   Progress toward BP goal: Unchanged   Hypertension comments: pt reports she does not take her lisinopril every day....... 4/week  Self-Management Support :   Personal Goals (by the next clinic visit) :     Personal A1C goal: 7  (06/11/2009)     Personal blood pressure goal: 140/90  (06/25/2009)     Personal LDL goal: 100  (06/11/2009)    Patient will work on the following items until the next clinic visit to reach self-care goals:     Medications and monitoring: take my medicines every day, bring all of my medications to every visit, examine my feet every day  (02/13/2010)     Eating: eat more vegetables, use fresh or frozen vegetables, eat foods that are low in salt, eat baked foods instead of fried foods, eat fruit for snacks and desserts  (02/13/2010)     Activity: take a 30 minute walk every day, take the stairs instead of the elevator, park at the far end of the parking lot  (12/24/2009)    Diabetes self-management support: Written self-care plan  (02/13/2010)   Diabetes care plan printed   Last diabetes self-management training by diabetes educator: 06/11/2009    Hypertension self-management support: Written self-care plan  (02/13/2010)   Hypertension self-care plan printed.    Lipid self-management support: Written self-care plan  (02/13/2010)   Lipid self-care plan printed.   Nursing Instructions: Refer for screening diabetic eye exam (see order)   Process Orders Check Orders Results:     [facility]: Check successful Order queued for requisitioning for [facility]: February 13, 2010 11:02 AM  Tests Sent for requisitioning (February 13, 2010 11:02 AM):     02/13/2010: [facility] -- T-Lipoprotein (LDL cholesterol)  [phone] (signed)     02/13/2010: [facility] -- T-Comprehensive Metabolic Panel [phone] (signed)    Laboratory Results   Blood Tests   Date/Time Received: February 13, 2010 10:38 AM  Date/Time Reported: Kaylee Murphy  February 13, 2010 10:38 AM   HGBA1C: 6.9%   (Normal Range: Non-Diabetic - 3-6%   Control Diabetic - 6-8%) CBG Random:: 200mg /dL     Appended Document: ACUTE-SHOULDER PAIN/CFB/(GARG)    Diabetic Foot Exam Last Podiatry Exam Date: 02/13/2010  Foot Inspection Is there a history of a foot ulcer?              No Is there a foot ulcer now?              No Can the patient see the bottom of their feet?          Yes Are the shoes appropriate in style and fit?          Yes Is there swelling or an abnormal foot shape?          No Are the toenails long?                No Are the toenails thick?                No Are the toenails ingrown?              No Is there heavy callous build-up?              No  Diabetic Foot Care Education Patient educated on appropriate care of diabetic feet.  Pulse Check          Right Foot          Left Foot Dorsalis Pedis:        normal  normal Comments: Corns and callouses noted on both feet.   10-g (5.07) Semmes-Weinstein Monofilament Test Performed by: Chinita Pester RN          Right Foot          Left Foot Visual Inspection     normal         normal Test Control      normal         normal Site 1         normal         normal Site 2         normal         normal Site 3         normal         normal Site 4         normal         normal Site 5         normal         normal Site 6         normal         normal Site 7         normal         normal Site 8         normal         normal Site 9         normal         normal   Prevention & Chronic Care Immunizations   Influenza vaccine: Not documented   Influenza vaccine deferral: Not indicated  (12/24/2009)    Tetanus booster: Not documented   Td booster deferral: Not indicated  (06/11/2009)    Pneumococcal vaccine: Not documented  Other Screening   Pap smear: Not documented   Pap smear action/deferral: Deferred-3 yr interval   (12/24/2009)    Mammogram: ASSESSMENT: Negative - BI-RADS 1^MM DIGITAL SCREENING  (06/16/2009)   Mammogram action/deferral: Ordered  (06/11/2009)   Smoking status: current  (02/13/2010)   Smoking cessation counseling: yes  (02/13/2010)  Diabetes Mellitus   HgbA1C: 6.9  (02/13/2010)    Eye exam: Not documented   Diabetic eye exam action/deferral: Ophthalmology referral  (02/13/2010)    Foot exam: Not documented   Foot exam action/deferral: Do today   High risk foot: Not documented   Foot care education: Done  (02/13/2010)    Urine microalbumin/creatinine ratio: 4.1  (07/01/2009)   Urine microalbumin action/deferral: Deferred  Lipids   Total Cholesterol: 218  (07/01/2009)   Lipid panel action/deferral: LDL Direct Ordered   LDL: 098  (07/01/2009)   LDL Direct: Not documented   HDL: 35  (07/01/2009)   Triglycerides: 220  (07/01/2009)    SGOT (AST): 16  (07/01/2009)   SGPT (ALT): 13  (07/01/2009)   Alkaline phosphatase: 70  (07/01/2009)   Total bilirubin: 0.3  (07/01/2009)  Hypertension   Last Blood Pressure: 132 / 92  (02/13/2010)   Serum creatinine: 0.75  (07/01/2009)   BMP action: Deferred   Serum potassium 4.0  (07/01/2009)  Self-Management Support :   Personal Goals (by the next clinic visit) :     Personal A1C goal: 7  (06/11/2009)     Personal blood pressure goal: 140/90  (06/25/2009)     Personal LDL goal: 100  (06/11/2009)    Patient will work on the following items until the next clinic visit to reach self-care goals:     Medications and monitoring: take my  medicines every day, bring all of my medications to every visit, examine my feet every day  (02/13/2010)     Eating: eat more vegetables, use fresh or frozen vegetables, eat foods that are low in salt, eat baked foods instead of fried foods, eat fruit for snacks and desserts  (02/13/2010)     Activity: take a 30 minute walk every day, take the stairs instead of the elevator, park at the far end of the  parking lot  (12/24/2009)    Diabetes self-management support: Written self-care plan  (02/13/2010)   Diabetes care plan printed   Last diabetes self-management training by diabetes educator: 06/11/2009    Hypertension self-management support: Written self-care plan  (02/13/2010)   Hypertension self-care plan printed.    Lipid self-management support: Written self-care plan  (02/13/2010)   Lipid self-care plan printed.   Nursing Instructions: Diabetic foot exam today

## 2010-10-13 NOTE — Progress Notes (Signed)
Summary: med rfill/gp  Phone Note Refill Request Message from:  Patient on November 03, 2009 2:06 PM  Refills Requested: Medication #1:  PRAVACHOL 40 MG TABS take 1 pill by mouth daily.   Dosage confirmed as above?Dosage Confirmed   Supply Requested: 3 months  Medication #2:  VICODIN 5-500 MG TABS Take 1 tablet by mouth as needed for pain at bed time..   Dosage confirmed as above?Dosage Confirmed   Supply Requested: 1 month   Last Refilled: 10/01/2009 Last CMP/Lipid 07/01/09.  Missed her appt. this morning; stated she thought it was 2:30 this afternoon.   Rescheduled 11/11/09 w/Dr. Eben Burow   Method Requested: Telephone to Pharmacy Initial call taken by: Chinita Pester RN,  November 03, 2009 2:11 PM  Follow-up for Phone Call        Indication for long term vicodin is unclear at this point.  Ms. Lansky to see Dr. Eben Burow on 11/11/2009.  Will give enough vicodin to get her to the first of March. Follow-up by: Doneen Poisson MD,  November 03, 2009 3:09 PM  Additional Follow-up for Phone Call Additional follow up Details #1::        Rx called to pharmacy - [facility]. Pt. was called and made awared to keep her appt. Additional Follow-up by: Chinita Pester RN,  November 03, 2009 5:03 PM    Prescriptions: VICODIN 5-500 MG TABS (HYDROCODONE-ACETAMINOPHEN) Take 1 tablet by mouth as needed for pain at bed time.  #10 x 0   Entered and Authorized by:   Doneen Poisson MD   Signed by:   Doneen Poisson MD on 11/03/2009   Method used:   Telephoned to ...       [facility]       [address]       [place], [place]  [address]       Ph: [phone]       Fax: [phone]   RxID:   [id number] PRAVACHOL 40 MG TABS (PRAVASTATIN SODIUM) take 1 pill by mouth daily.  #30 x 3   Entered and Authorized by:   Doneen Poisson MD   Signed by:   Doneen Poisson MD on 11/03/2009   Method used:   Electronically to        [facility] (retail)       [address]       [place], [place]  [address]       Ph: [phone]       Fax: [phone]   RxID:   [id number]

## 2010-10-13 NOTE — Letter (Signed)
[facility]   Imported By: Shon Hough 06/01/2010 15:33:49  _____________________________________________________________________  External Attachment:    Type:   Image     Comment:   External Document

## 2010-10-13 NOTE — Consult Note (Signed)
Summary: [facility]  [facility]ERRED [facility]-[place]   Imported By: Louretta Parma 07/07/2010 16:50:02  _____________________________________________________________________  External Attachment:    Type:   Image     Comment:   External Document

## 2010-10-13 NOTE — Assessment & Plan Note (Signed)
Summary: EST-1 MONTH RECHECK PER [facility]   Vital Signs:  Patient profile:   50 year old female Height:      65 inches Weight:      154.1 pounds BMI:     25.74 Temp:     98.0 degrees F oral Pulse rate:   83 / minute BP sitting:   145 / 87  (right arm)  Vitals Entered By: Filomena Jungling NT II (September 30, 2009 3:17 PM) CC: foloow-up visit, Depression Is Patient Diabetic? Yes Did you bring your meter with you today? No Pain Assessment Patient in pain? yes     Location: head, neck and shoulder Type: aching Onset of pain  Chronic Nutritional Status BMI of 25 - 29 = overweight  Does patient need assistance? Functional Status Self care Ambulation Normal    Primary Care Provider:  Lars Mage MD  CC:  foloow-up visit and Depression.  History of Present Illness: 50 yo patient comes in today with c/o b/l shoulder pain 10/10 at its worst, agg by movement and relieved by pain meds only. She also c/o headache which started this morning and present over the forehead. It is not reproducible on pressing and aggravated by nothing. The antidepressent started last month by Dr Sunnie Nielsen has not helped her with her depression and she requests something which could help her sleep. No other complaints at this time.  Depression History:      The patient denies a depressed mood most of the day and a diminished interest in her usual daily activities.         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 Take 1 Tablet By Mouth Once A Day 2)  Gabapentin 300  Mg Caps (Gabapentin) .... Take 1 Tablet By Mouth Three Times A Day 3)  Fluoxetine Hcl 20 Mg Caps (Fluoxetine Hcl) .... Take 1 Tablet By Mouth Daily. 4)  Tramadol Hcl 50 Mg Tabs (Tramadol Hcl) .... Take 1 Tablet By Mouth Every 8 Hour For Pain As Needed. 5)  Truetrack Test  Strp (Glucose Blood) .... Use To Check Blood Sugar 2 Hours After Meals 6)  Lancets  Misc (Lancets) .... Use To Check Blood Sugars 2 Hours After Meals 7)  Pravachol 40 Mg Tabs (Pravastatin Sodium) .... Take 1 Pill By Mouth Daily.  Current Medications (verified): 1)  Vitamin B-12 500 Mcg Tabs (Cyanocobalamin) .... Take 1 Tablet By Mouth Once A Day 2)  Gabapentin 300 Mg Caps (Gabapentin) .... Take 1 Tablet By Mouth Three Times A Day 3)  Truetrack Test  Strp (Glucose Blood) .... Use To Check Blood Sugar 2 Hours After Meals 4)  Lancets  Misc (Lancets) .... Use To Check Blood Sugars 2 Hours After Meals 5)  Pravachol 40 Mg Tabs (Pravastatin Sodium) .... Take 1 Pill By Mouth Daily. 6)  Metformin Hcl 500 Mg Tabs (Metformin Hcl) .... Take 1 Tablet By Mouth Once A Day 7)  Paroxetine Hcl 10 Mg Tabs (Paroxetine Hcl) .... At Bedtime 8)  Vicodin 5-500  Mg Tabs (Hydrocodone-Acetaminophen) .... Take 1 Tablet By Mouth As Needed For Pain At Bed Time.  Allergies (verified): 1)  ! Keflex  Past History:  Past Surgical History: Last updated: 06/11/2009 rotator cuff repair in 2004.  Family History: Last updated: 10-25-09 Mother died of cervical cancer father died of Stroke at age 51.  Social History: Last updated: 25-Oct-2009 Recently moved to [place] from [place]. 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 Single Current Smoker Drug use-no  Risk Factors: Smoking Status: current (Oct 25, 2009) Packs/Day: 0.5 (10/25/2009)  Family History: Mother died of cervical cancer father died of Stroke at age 66.  Social History: Recently moved to [place] from [place]. 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 Single Current Smoker Drug  use-no  Review of Systems      See HPI  Physical Exam  Additional Exam:  Gen: AOx3, in no acute distress Eyes: PERRL, EOMI ENT:MMM, No erythema noted in posterior pharynx Neck: No JVD, No LAP Chest: CTAB with  good respiratory effort CVS: regular rhythmic rate, NO M/R/G, S1 S2 normal Abdo: soft,ND, BS+x4, Non tender and No hepatosplenomegaly EXT: unable to lift both arms beyond 90degree, unable to take the arm back b/l, flexion and extension in sholuder joint against resistence was painful. Neuro: Non focal, gait is normal Skin: no rashes noted.    Impression & Recommendations:  Problem # 1:  ROTATOR CUFF REPAIR, HX OF (ICD-V45.89) She bought her previous records from [place] today which were s/o 2 operation done in right shoulder and 1 done in rt shoulder for rotator cuff tear. I view of the clinical exam findings c/w secere pain against resistence it seems that her rotator cuff tear needs attenton by a specialist. We will do an MRI for evaluaton of her b/l shoulder joint. I will also prescribe her some Vicodin to help her with the pain while she starts the process of referrel and gets an MRI for evaluaton.  In view of chronic narcotic use in the past and CNA being at high risk for clinica suspicion of drug abuse, I obtained and reviewed pain contract with her, including doing a UDS. We will also check background by running her name in [facility] prescription list. Review after 1 month. Orders: MRI (MRI) Orthopedic Surgeon Referral (Ortho Surgeon)  Problem # 2:  DEPRESSION, MAJOR (ICD-296.20) Started her on paxil as it has somnolence as a side effect in 15-20% which is highly desirable in this patient. No SI/HI at thsi time.  Problem # 3:  PREVENTIVE HEALTH CARE (ICD-V70.0) Reviewed  prevention screening protocols and immunization status. Orders: Gynecologic Referral (Gyn)  Problem # 4:  ESSENTIAL HYPERTENSION, BENIGN (ICD-401.1) Still mild hypertension is present. I assume that lack  of sleep, pain, anxiety and continued smoking is contributing to high BP at this time. Will likely increse the dose of her current meds if this persists. Discussed smoking cessaton and diet modification. BP today: 145/87 Prior BP: 149/85 (08/29/2009)  Labs Reviewed: K+: 4.0 (07/01/2009) Creat: : 0.75 (07/01/2009)   Chol: 218 (07/01/2009)   HDL: 35 (07/01/2009)   LDL: 139 (07/01/2009)   TG: 220 (07/01/2009)  Problem # 5:  DM (ICD-250.00) Added metformin today for maintenance of the current Hba1c as she meets the criteria for DMtpe 2. Will obtain LFT's during next visist, Optha referrel pending, foot exam done today, urine microalbumin/crt ratio well under 30. BP managemnt stressed upon. Her updated medication list for this problem includes:    Metformin Hcl 500 Mg Tabs (Metformin hcl) .Marland Kitchen... Take 1  tablet by mouth once a day  Problem # 6:  TOBACCO ABUSE (ICD-305.1) discussed smoking cessation and not ready at this time.  Complete Medication List: 1)  Vitamin B-12 500 Mcg Tabs (Cyanocobalamin) .... Take 1 tablet by mouth once a day 2)  Gabapentin 300 Mg Caps (Gabapentin) .... Take 1 tablet by mouth three times a day 3)  Truetrack Test Strp (Glucose blood) .... Use to check blood sugar 2 hours after meals 4)  Lancets Misc (Lancets) .... Use to check blood sugars 2 hours after meals 5)  Pravachol 40 Mg Tabs (Pravastatin sodium) .... Take 1 pill by mouth daily. 6)  Metformin Hcl 500 Mg Tabs (Metformin hcl) .... Take 1 tablet by mouth once a day 7)  Paroxetine Hcl 10 Mg Tabs (Paroxetine hcl) .... At bedtime 8)  Vicodin 5-500 Mg Tabs (Hydrocodone-acetaminophen) .... Take 1 tablet by mouth as needed for pain at bed time.  Patient Instructions: 1)  Please schedule a follow-up appointment in 1 month. 2)  Tobacco is very bad for your health and your loved ones! You Should stop smoking!. 3)  Stop Smoking Tips: Choose a Quit date. Cut down before the Quit date. decide what you will do as a substitute  when you feel the urge to smoke(gum,toothpick,exercise). 4)  It is important that you exercise regularly at least 20 minutes 5 times a week. If you develop chest pain, have severe difficulty breathing, or feel very tired , stop exercising immediately and seek medical attention. 5)  You need to lose weight. Consider a lower calorie diet and regular exercise.  6)  You need to have a Pap Smear to prevent cervical cancer. 7)  Check your blood sugars regularly. If your readings are usually above : 200 or below 70 you should contact our office. 8)  It is important that your Diabetic A1c level is checked every 3 months. 9)  See your eye doctor yearly to check for diabetic eye damage. 10)  Check your feet each night for sore areas, calluses or signs of infection. 11)  Check your Blood Pressure regularly. If it is above: 140/90 you should make an appointment. Prescriptions: PAROXETINE HCL 10 MG TABS (PAROXETINE HCL) at bedtime  #30 x 0   Entered and Authorized by:   Lars Mage MD   Signed by:   Lars Mage MD on 09/30/2009   Method used:   Electronically to        [facility] [address] (retail)       [phone]       [place], [place]  [address]       Ph: [phone]       Fax: [phone]   RxID:   [id number] METFORMIN HCL 500 MG TABS (METFORMIN HCL) Take 1 tablet by mouth once a day  #30 x 1   Entered and Authorized by:   Lars Mage MD   Signed by:   Lars Mage MD on 09/30/2009   Method used:   Electronically to        [facility] [address] [place] (retail)       [phone]       [place], [place]  [address]       Ph: [phone]       Fax: [phone]   RxID:   [id number] VICODIN 5-500 MG TABS (HYDROCODONE-ACETAMINOPHEN) Take 1 tablet by mouth as needed for pain  at bed time.  #60 x 0   Entered and Authorized by:   Lars Mage MD   Signed by:   Lars Mage MD on 09/30/2009   Method used:   Handwritten   RxID:    [id number] PAROXETINE HCL 10 MG TABS (PAROXETINE HCL) at bedtime  #30 x 0   Entered and Authorized by:   Lars Mage MD   Signed by:   Lars Mage MD on 09/30/2009   Method used:   Print then Give to Patient   RxID:   [id number] METFORMIN HCL 500 MG TABS (METFORMIN HCL) Take 1 tablet by mouth once a day  #30 x 1   Entered and Authorized by:   Lars Mage MD   Signed by:   Lars Mage MD on 09/30/2009   Method used:   Print then Give to Patient   RxID:   [id number]   Prevention & Chronic Care Immunizations   Influenza vaccine: Not documented   Influenza vaccine deferral: Deferred  (09/30/2009)    Tetanus booster: Not documented   Td booster deferral: Not indicated  (06/11/2009)    Pneumococcal vaccine: Not documented  Other Screening   Pap smear: Not documented   Pap smear action/deferral: GYN referral  (09/30/2009)    Mammogram: ASSESSMENT: Negative - BI-RADS 1^MM DIGITAL SCREENING  (06/16/2009)   Mammogram action/deferral: Ordered  (06/11/2009)   Smoking status: current  (09/30/2009)   Smoking cessation counseling: yes  (09/30/2009)  Diabetes Mellitus   HgbA1C: 6.6  (07/24/2009)    Eye exam: Not documented   Diabetic eye exam action/deferral: Ophthalmology referral  (06/11/2009)    Foot exam: Not documented   Foot exam action/deferral: Do today   High risk foot: Not documented   Foot care education: Not documented    Urine microalbumin/creatinine ratio: 4.1  (07/01/2009)   Urine microalbumin action/deferral: Deferred    Diabetes flowsheet reviewed?: Yes   Progress toward A1C goal: At goal  Lipids   Total Cholesterol: 218  (07/01/2009)   Lipid panel action/deferral: Not indicated   LDL: 139  (07/01/2009)   LDL Direct: Not documented   HDL: 35  (07/01/2009)   Triglycerides: 220  (07/01/2009)  Hypertension   Last Blood Pressure: 145 / 87  (09/30/2009)   Serum creatinine: 0.75  (07/01/2009)   BMP action: Deferred   Serum potassium 4.0   (07/01/2009)    Hypertension flowsheet reviewed?: Yes   Progress toward BP goal: At goal  Self-Management Support :   Personal Goals (by the next clinic visit) :     Personal A1C goal: 7  (06/11/2009)     Personal blood pressure goal: 140/90  (06/25/2009)     Personal LDL goal: 100  (06/11/2009)    Patient will work on the following items until the next clinic visit to reach self-care goals:     Medications and monitoring: take my medicines every day  (09/30/2009)     Eating: eat more vegetables, use fresh or frozen vegetables, eat foods that are low in salt, eat baked foods instead of fried foods, eat fruit for snacks and desserts  (09/30/2009)    Diabetes self-management support: Education handout  (09/30/2009)   Diabetes education handout printed   Last diabetes self-management training by diabetes educator: 06/11/2009    Hypertension self-management support: Education handout  (09/30/2009)   Hypertension education handout printed   Nursing Instructions: Diabetic foot exam today

## 2010-10-13 NOTE — Letter (Signed)
Summary: Winifred Olive  [facility]   Imported By: Margie Billet 12/25/2009 14:00:40  _____________________________________________________________________  External Attachment:    Type:   Image     Comment:   External Document

## 2010-10-13 NOTE — Medication Information (Signed)
Summary: CONTROLLED MEDICATION TREATMENT AGREEMENT  CONTROLLED MEDICATION TREATMENT AGREEMENT   Imported By: Margie Billet 06/25/2010 14:19:58  _____________________________________________________________________  External Attachment:    Type:   Image     Comment:   External Document

## 2010-10-13 NOTE — Progress Notes (Signed)
Summary: refill/ hla  Phone Note Refill Request Message from:  Patient on January 07, 2010 11:37 AM  Refills Requested: Medication #1:  PERCOCET 5-325 MG TABS take 1 tablet up to 4 times per day as needed for pain.   Dosage confirmed as above?Dosage Confirmed   Last Refilled: 4/13 Initial call taken by: Marin Roberts RN,  January 07, 2010 11:37 AM  Follow-up for Phone Call        Rx denied because during her previous visit with me and Dr Josem Kaufmann we decided to try out all the classes of NASIDS before prescribing any narcotics. Please refer to My note from March 1st 2011. Please schedule a OPC visit if there is any concern regarding this. Follow-up by: Lars Mage MD,  January 08, 2010 10:47 PM  Additional Follow-up for Phone Call Additional follow up Details #1::        spoke w/ pt, she has rehab tomorrow and will come for appt after rehab Additional Follow-up by: Marin Roberts RN,  Jan 13, 2010 4:24 PM    Additional Follow-up for Phone Call Additional follow up Details #2::    please see my note [place] Follow-up by: Mliss Sax MD,  Jan 14, 2010 3:50 PM   Appended Document: refill/ hla Thank you for seeing the patient. Ankit

## 2010-10-13 NOTE — Miscellaneous (Signed)
Summary: [facility]: Diabetes Testing Supplies  [facility]: Diabetes Testing Supp[facility]   Imported By: Florinda Marker 11/14/2009 16:00:54  _____________________________________________________________________  External Attachment:    Type:   Image     Comment:   External Document

## 2010-10-13 NOTE — Progress Notes (Signed)
  Phone Note Outgoing Call   Call placed by: Theotis Barrio NT II,  March 17, 2010 2:24 PM Call placed to: Patient Details for Reason: APPT WITH [facility] GYN Summary of Call: CALLED AND SPOKE WITH SISTER CAROL Shockley/ GAVE HER THE APPT INFORMATION, ALSO TO LET PATIENT KNOW REMINDER APPT CARD MAILED OUT TO HER/ Lela Sturdivant NT II  March 17, 2010 2:26 PM

## 2010-10-13 NOTE — Assessment & Plan Note (Signed)
Summary: ACUTE-SHOULDER PAIN/CFB   Vital Signs:  Patient profile:   50 year old female Height:      65 inches (165.10 cm) Weight:      158.0 pounds (71.82 kg) BMI:     26.39 Temp:     98.0 degrees F (36.67 degrees C) oral Pulse rate:   107 / minute BP sitting:   111 / 79  (left arm)  Vitals Entered By: Stanton Kidney Ditzler RN (December 24, 2009 3:01 PM) Is Patient Diabetic? Yes Did you bring your meter with you today? No Pain Assessment Patient in pain? yes     Location: both shoulders Intensity: 5 Type: sharp Onset of pain  long time Nutritional Status BMI of 25 - 29 = overweight Nutritional Status Detail appetite good  Have you ever been in a relationship where you felt threatened, hurt or afraid?denies   Does patient need assistance? Functional Status Self care Ambulation Normal Comments CBG done this AM - 109. FU - no help with cortisone injection - what to do next. Does not want surgery.   Primary Care Provider:  Lars Mage MD   History of Present Illness: 50 yo female with PMH outlined below presents to [facility] for regular follow up appointment. She has no major concerns at the time except her chronic bilateral shoulder pain. She has seen orthopedic doctor for this same problem and they gave her steroid shot. This has not helped her and she is not sure what to do at this point. No recent sicknesses or hospitalizaitons. No episodes of chest pain, SOB, palpitations. No specific abdominal or urinary concerns. No recent changes in appetite, weight, sleep patterns, mood.    Depression History:      The patient denies a depressed mood most of the day and a diminished interest in her usual daily activities.  The patient denies significant weight loss, significant weight gain, insomnia, hypersomnia, psychomotor agitation, psychomotor retardation, fatigue (loss of energy), feelings of worthlessness (guilt), impaired concentration (indecisiveness), and recurrent thoughts of death or  suicide.        The patient denies that she feels like life is not worth living, denies that she wishes that she were dead, and denies that she has thought about ending her life.         Preventive Screening-Counseling & Management  Alcohol-Tobacco     Smoking Status: current     Smoking Cessation Counseling: yes     Packs/Day: 3-4 cigs per day     Year Started: 1990     Pack years: 20  Problems Prior to Update: 1)  Preventive Health Care  (ICD-V70.0) 2)  Depression, Major  (ICD-296.20) 3)  Tobacco Abuse  (ICD-305.1) 4)  Rotator Cuff Repair, Hx of  (ICD-V45.89) 5)  Essential Hypertension, Benign  (ICD-401.1) 6)  Dm  (ICD-250.00)  Medications Prior to Update: 1)  Lisinopril 10 Mg Tabs (Lisinopril) .... Take 1 Tablet By Mouth Once A Day 2)  Pravachol 40 Mg Tabs (Pravastatin Sodium) .... Take 1 Pill By Mouth Daily. 3)  Metformin Hcl 500 Mg Tabs (Metformin Hcl) .... Take 1 Tablet By Mouth Once A Day 4)  Paroxetine Hcl 10 Mg Tabs (Paroxetine Hcl) .... At Bedtime 5)  Vicodin 5-500 Mg Tabs (Hydrocodone-Acetaminophen) .... Take 1 Tablet By Mouth As Needed For Pain At Bed Time. 6)  Diclofenac Sodium 75 Mg Tbec (Diclofenac Sodium) .... Take 1 Tablet By Mouth Two Times A Day 7)  Gabapentin 300 Mg Caps (Gabapentin) .... Take 1 Tablet  By Mouth Three Times A Day 8)  Vitamin B-12 500 Mcg Tabs (Cyanocobalamin) .... Take 1 Tablet By Mouth Once A Day 9)  Truetrack Test  Strp (Glucose Blood) .... Use To Check Blood Sugar 2 Hours After Meals 10)  Lancets  Misc (Lancets) .... Use To Check Blood Sugars 2 Hours After Meals  Current Medications (verified): 1)  Lisinopril 10 Mg Tabs (Lisinopril) .... Take 1 Tablet By Mouth Once A Day 2)  Pravachol 40 Mg Tabs (Pravastatin Sodium) .... Take 1 Pill By Mouth Daily. 3)  Metformin Hcl 500 Mg Tabs (Metformin Hcl) .... Take 1 Tablet By Mouth Once A Day 4)  Paroxetine Hcl 10 Mg Tabs (Paroxetine Hcl) .... At Bedtime 5)  Vicodin 5-500 Mg Tabs  (Hydrocodone-Acetaminophen) .... Take 1 Tablet By Mouth As Needed For Pain At Bed Time. 6)  Diclofenac Sodium 75 Mg Tbec (Diclofenac Sodium) .... Take 1 Tablet By Mouth Two Times A Day 7)  Gabapentin 300 Mg Caps (Gabapentin) .... Take 1 Tablet By Mouth Three Times A Day 8)  Vitamin B-12 500 Mcg Tabs (Cyanocobalamin) .... Take 1 Tablet By Mouth Once A Day 9)  Truetrack Test  Strp (Glucose Blood) .... Use To Check Blood Sugar 2 Hours After Meals 10)  Lancets  Misc (Lancets) .... Use To Check Blood Sugars 2 Hours After Meals  Allergies: 1)  ! Keflex  Past History:  Past Surgical History: Last updated: 06/11/2009 rotator cuff repair in 2004.  Family History: Last updated: 27-Oct-2009 Mother died of cervical cancer father died of Stroke at age 29.  Social History: Last updated: 10/27/2009 Recently moved to [place] from [place]. 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 Single Current Smoker Drug use-no  Risk Factors: Smoking Status: current (12/24/2009) Packs/Day: 3-4 cigs per day (12/24/2009)  Family History: Reviewed history from 10/27/2009 and no changes required. Mother died of cervical cancer father died of Stroke at age 32.  Social History: Reviewed history from 10-27-09 and no changes required. Recently moved to [place] from [place]. 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 Single Current Smoker Drug use-no  Review of Systems       per HPI   Physical Exam  General:  Well-developed,well-nourished,in no acute distress; alert,appropriate and cooperative throughout examination Lungs:  Normal respiratory effort, chest expands symmetrically. Lungs are clear to auscultation, no crackles or wheezes. Heart:  Normal rate and regular rhythm. S1 and S2 normal without gallop, murmur, click, rub or other extra sounds. Abdomen:  Bowel sounds positive,abdomen soft and non-tender without masses, organomegaly or hernias noted.   Shoulder/Elbow Exam  Shoulder Exam:     Right:    Inspection:  Normal       Location:  right bicipital groove    Stability:  stable    Tenderness:  right bicipital groove    Swelling:  no    Erythema:  no    Left:    Inspection:  Normal       Location:  right infrascapular    Tenderness:  right infrascapular    Swelling:  no    Erythema:  no   Impression & Recommendations:  Problem # 1:  TOBACCO ABUSE (ICD-305.1)  Encouraged smoking cessation and discussed different methods for smoking cessation.   Problem # 2:  ESSENTIAL HYPERTENSION, BENIGN (ICD-401.1) At goal, will continue the same regimen.  Her updated medication list for this problem includes:    Lisinopril 10 Mg Tabs (Lisinopril) .Marland Kitchen... Take 1 tablet by mouth once a day  BP today: 111/79  Prior BP: 144/88 (11/11/2009)  Labs Reviewed: K+: 4.0 (07/01/2009) Creat: : 0.75 (07/01/2009)   Chol: 218 (07/01/2009)   HDL: 35 (07/01/2009)   LDL: 139 (07/01/2009)   TG: 220 (07/01/2009)  Problem # 3:  ROTATOR CUFF REPAIR, HX OF (ICD-V45.89)  Unsure if PT will help but we can try this since it appears as everything else has been tried and th einjections she has been getting by ortho doc have not been helping.   Orders: Physical Therapy Referral (PT)  Problem # 4:  DM (ICD-250.00) Stable, will continue the same regimen.  Her updated medication list for this problem includes:    Lisinopril 10 Mg Tabs (Lisinopril) .Marland Kitchen... Take 1 tablet by mouth once a day    Metformin Hcl 500 Mg Tabs (Metformin hcl) .Marland Kitchen... Take 1 tablet by mouth once a day  Labs Reviewed: Creat: 0.75 (07/01/2009)    Reviewed HgBA1c results: 6.6 (11/11/2009)  6.6 (07/24/2009)  Complete Medication List: 1)  Lisinopril 10 Mg Tabs (Lisinopril) .... Take 1 tablet by mouth once a day 2)  Pravachol 40 Mg Tabs (Pravastatin sodium) .... Take 1 pill by mouth daily. 3)  Metformin Hcl 500 Mg Tabs (Metformin hcl) .... Take 1 tablet by mouth once a day 4)  Paroxetine Hcl 10 Mg Tabs (Paroxetine hcl) .... At  bedtime 5)  Vicodin 5-500 Mg Tabs (Hydrocodone-acetaminophen) .... Take 1 tablet by mouth as needed for pain at bed time. 6)  Diclofenac Sodium 75 Mg Tbec (Diclofenac sodium) .... Take 1 tablet by mouth two times a day 7)  Gabapentin 300 Mg Caps (Gabapentin) .... Take 1 tablet by mouth three times a day 8)  Vitamin B-12 500 Mcg Tabs (Cyanocobalamin) .... Take 1 tablet by mouth once a day 9)  Truetrack Test Strp (Glucose blood) .... Use to check blood sugar 2 hours after meals 10)  Lancets Misc (Lancets) .... Use to check blood sugars 2 hours after meals 11)  Percocet 5-325 Mg Tabs (Oxycodone-acetaminophen) .... Take 1 tablet up to 4 times per day as needed for pain  Patient Instructions: 1)  Please schedule a follow-up appointment in 3 months. 2)  Please check your blood pressure regularly, if it is >170 please call clinic at [phone] 3)  Cigarette smoking is estimated to be responsible for over five million premature deaths worldwide, making it the leading preventable cause of death. The most important causes of smoking-related mortality include atherosclerotic cardiovascular disease, lung cancer, and chronic obstructive pulmonary disease (COPD).  4)  Please check your sugar levels regularly and remember to bring the meter with you to the next clinic appointment, if the sugars are > 350 or < 60 please call us at [phone] Prescriptions: PERCOCET 5-325 MG TABS (OXYCODONE-ACETAMINOPHEN) take 1 tablet up to 4 times per day as needed for pain  #60 x 0   Entered and Authorized by:   Mliss Sax MD   Signed by:   Mliss Sax MD on 12/24/2009   Method used:   Print then Give to Patient   RxID:   [id number]    Prevention & Chronic Care Immunizations   Influenza vaccine: Not documented   Influenza vaccine deferral: Not indicated  (12/24/2009)    Tetanus booster: Not documented   Td booster deferral: Not indicated  (06/11/2009)    Pneumococcal vaccine: Not documented  Other Screening    Pap smear: Not documented   Pap smear action/deferral: Deferred-3 yr interval  (12/24/2009)    Mammogram: ASSESSMENT: Negative - BI-RADS 1^MM DIGITAL  SCREENING  (06/16/2009)   Mammogram action/deferral: Ordered  (06/11/2009)   Smoking status: current  (12/24/2009)   Smoking cessation counseling: yes  (12/24/2009)  Diabetes Mellitus   HgbA1C: 6.6  (11/11/2009)    Eye exam: Not documented   Diabetic eye exam action/deferral: Deferred  (12/24/2009)    Foot exam: Not documented   Foot exam action/deferral: Do today   High risk foot: Not documented   Foot care education: Not documented    Urine microalbumin/creatinine ratio: 4.1  (07/01/2009)   Urine microalbumin action/deferral: Deferred    Diabetes flowsheet reviewed?: Yes   Progress toward A1C goal: Unchanged  Lipids   Total Cholesterol: 218  (07/01/2009)   Lipid panel action/deferral: Not indicated   LDL: 139  (07/01/2009)   LDL Direct: Not documented   HDL: 35  (07/01/2009)   Triglycerides: 220  (07/01/2009)  Hypertension   Last Blood Pressure: 111 / 79  (12/24/2009)   Serum creatinine: 0.75  (07/01/2009)   BMP action: Deferred   Serum potassium 4.0  (07/01/2009)    Hypertension flowsheet reviewed?: Yes   Progress toward BP goal: At goal  Self-Management Support :   Personal Goals (by the next clinic visit) :     Personal A1C goal: 7  (06/11/2009)     Personal blood pressure goal: 140/90  (06/25/2009)     Personal LDL goal: 100  (06/11/2009)    Patient will work on the following items until the next clinic visit to reach self-care goals:     Medications and monitoring: take my medicines every day, check my blood sugar, check my blood pressure, bring all of my medications to every visit, weigh myself weekly, examine my feet every day  (12/24/2009)     Eating: eat more vegetables, use fresh or frozen vegetables, eat foods that are low in salt, eat baked foods instead of fried foods, eat fruit for snacks and desserts   (12/24/2009)     Activity: take a 30 minute walk every day, take the stairs instead of the elevator, park at the far end of the parking lot  (12/24/2009)    Diabetes self-management support: Written self-care plan, Education handout, Resources for patients handout  (12/24/2009)   Diabetes care plan printed   Diabetes education handout printed   Last diabetes self-management training by diabetes educator: 06/11/2009    Hypertension self-management support: Written self-care plan, Education handout, Resources for patients handout  (12/24/2009)   Hypertension self-care plan printed.   Hypertension education handout printed      Resource handout printed.   Nursing Instructions: Diabetic foot exam today

## 2010-10-13 NOTE — Assessment & Plan Note (Signed)
Summary: pain contract per dr garg/ pcp-garg/hla   Vital Signs:  Patient profile:   50 year old female Height:      65 inches (165.10 cm) Weight:      160.9 pounds (73.14 kg) BMI:     26.87 Temp:     98.0 degrees F (36.67 degrees C) oral Pulse rate:   85 / minute BP sitting:   140 / 88  (left arm)  Vitals Entered By: Cynda Familia Duncan Dull) (June 22, 2010 1:32 PM) CC: discuss pain meds, Depression Is Patient Diabetic? Yes Pain Assessment Patient in pain? no      Nutritional Status BMI of 25 - 29 = overweight  Have you ever been in a relationship where you felt threatened, hurt or afraid?No   Does patient need assistance? Functional Status Self care Ambulation Normal   Primary Care Provider:  Lars Mage MD  CC:  discuss pain meds and Depression.  History of Present Illness: [name] is here today for a follow up appointment and to sign pain contract.  1. B/L rotator cuff tear: Has been present since "years". 4/10 at its worse. Pain in shoulder is worse with movement and is associated with tingling and numbness in her right hand. She is unable to sleep due to painsome times and is asking for some thing to help for nerve pain. Pain contract today.  2. Diabetes: HBa1c 6.9 recently. Optha exam on 18th.   3. BP is well controlled.  4. Depression: "I have been depressed all my life and can deal with it". She was prescribed SSRI's which she did not start as she thinks that she does not need help. Having trouble sleeping these days. Wants something to calm her down.   She denies any new sicknesses or hospitalizations, no chest pain episodes, no fevers, no chills, no abdominal or urinary concerns. No recent changes in appetite, weight.  Depression History:      The patient is having a depressed mood most of the day but denies diminished interest in her usual daily activities.        The patient denies that she wishes that she were dead and denies that she has thought about  ending her life.         Preventive Screening-Counseling & Management  Alcohol-Tobacco     Alcohol drinks/day: 0     Smoking Status: current     Smoking Cessation Counseling: yes     Packs/Day: 2-3 cigs per day     Year Started: 1990     Pack years: 20  Problems Prior to Update: 1)  Knee Pain, Bilateral  (ICD-719.46) 2)  Hyperlipidemia  (ICD-272.4) 3)  Preventive Health Care  (ICD-V70.0) 4)  Depression, Major  (ICD-296.20) 5)  Tobacco Abuse  (ICD-305.1) 6)  Rotator Cuff Repair, Hx of  (ICD-V45.89) 7)  Essential Hypertension, Benign  (ICD-401.1) 8)  Dm  (ICD-250.00)  Medications Prior to Update: 1)  Lisinopril 10 Mg Tabs (Lisinopril) .... Take 1 Tablet By Mouth Once A Day 2)  Pravachol 40 Mg Tabs (Pravastatin Sodium) .... Take 1 Pill By Mouth Daily. 3)  Metformin Hcl 500 Mg Tabs (Metformin Hcl) .... Take 1 Tablet By Mouth Two Times A Day 4)  Truetrack Test  Strp (Glucose Blood) .... Use To Check Blood Sugar 2 Hours After Meals 5)  Lancets  Misc (Lancets) .... Use To Check Blood Sugars 2 Hours After Meals 6)  Vitamin B-12 500 Mcg Tabs (Cyanocobalamin) .... Take 1 Tablet By Mouth  Once A Day 7)  Percocet 7.5-500 Mg Tabs (Oxycodone-Acetaminophen) .... Take 1 Tablet Every 8 Hours As Needed For Pain 8)  Gabapentin 300 Mg Caps (Gabapentin) .... Take 1 Tablet By Mouth Two Times A Day 9)  Trazodone Hcl 50 Mg Tabs (Trazodone Hcl) .... Take 1 Tab By Mouth At Bedtime  Current Medications (verified): 1)  Lisinopril 10 Mg Tabs (Lisinopril) .... Take 1 Tablet By Mouth Once A Day 2)  Pravachol 40 Mg Tabs (Pravastatin Sodium) .... Take 1 Pill By Mouth Daily. 3)  Metformin Hcl 500 Mg Tabs (Metformin Hcl) .... Take 1 Tablet By Mouth Two Times A Day 4)  Truetrack Test  Strp (Glucose Blood) .... Use To Check Blood Sugar 2 Hours After Meals 5)  Lancets  Misc (Lancets) .... Use To Check Blood Sugars 2 Hours After Meals 6)  Vitamin B-12 500 Mcg Tabs (Cyanocobalamin) .... Take 1 Tablet By Mouth Once A  Day 7)  Percocet 7.5-500 Mg Tabs (Oxycodone-Acetaminophen) .... Take 1 Tablet Every 8 Hours As Needed For Pain 8)  Gabapentin 300 Mg Caps (Gabapentin) .... Take 1 Tablet By Mouth Three Times A Day 9)  Amitriptyline Hcl 50 Mg Tabs (Amitriptyline Hcl) .... At Bedtime  Allergies: 1)  ! Keflex  Directives: 1)  Full Code   Past History:  Past Medical History: Last updated: 02/13/2010 Diabetes, well controlled on metformin HTN HLD chronic shoulder pain  Past Surgical History: Last updated: 04/15/2010 rotator cuff repair in 2004, 2005; left 2007  Family History: Last updated: 10-25-09 Mother died of cervical cancer father died of Stroke at age 26.  Social History: Last updated: 06/02/2010 Recently moved to [place] from [place]. 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 Single Current Smoker - 2 cig per day Drug use-no on disability Completed CNA and cosmetology courses after high school  Risk Factors: Alcohol Use: 0 (06/22/2010) Exercise: yes (06/02/2010)  Risk Factors: Smoking Status: current (06/22/2010) Packs/Day: 2-3 cigs per day (06/22/2010)  Family History: Reviewed history from 10/25/2009 and no changes required. Mother died of cervical cancer father died of Stroke at age 51.  Social History: Reviewed history from 06/02/2010 and no changes required. Recently moved to [place] from [place]. 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 Single Current Smoker - 2 cig per day Drug use-no on disability Completed CNA and cosmetology courses after high school  Review of Systems      See HPI  Physical Exam  Additional Exam:  Gen: AOx3, in no acute distress Eyes: PERRL, EOMI ENT:MMM, No erythema noted in posterior pharynx Neck: No JVD, No LAP Chest: CTAB with  good respiratory effort CVS: regular rhythmic rate, NO M/R/G, S1 S2 normal Abdo: soft,ND, BS+x4, Non tender and No hepatosplenomegaly EXT: No odema noted Neuro: Non focal, gait is normal Skin: no  rashes noted.  Shoulder exam: no motor weakness noted, no sensory loss noted, patient could take both her arms to the lower back and was able to take it atleast 90degrees without pain. TTP at shoulder joint bilaterally.   Impression & Recommendations:  Problem # 1:  ROTATOR CUFF REPAIR, HX OF (ICD-V45.89) Assessment Unchanged Denies PT and says that she does exercise herself. Denies local steroid injection as it didnt help in the past. Continue on percocet and add amitryptaline for night pain. Go up on gabapentin 300 three times a day.  UDS and medication refill today.  Orders: T-Drug Screen-Urine, (single) [phone])  Problem # 2:  DM (ICD-250.00) Assessment: Comment Only Last Hba1c 6.9. Will leave her on  metformin 500 two times a day for now. Her updated medication list for this problem includes:    Lisinopril 10 Mg Tabs (Lisinopril) .Marland Kitchen... Take 1 tablet by mouth once a day    Metformin Hcl 500 Mg Tabs (Metformin hcl) .Marland Kitchen... Take 1 tablet by mouth two times a day  Problem # 3:  DEPRESSION, MAJOR (ICD-296.20) Assessment: Comment Only Denies SI and HI at this time. Is not interested in any help. Wants something for anxiety but not SSRI's as she is specifically looking for something quick when she has anxiety. I explained about the the benefits and mechanism of SSRI's and she says that she doesnt have depression and does not want those meds. Discussion ended at this point. To consider SSRI's if this comes up in future. She may benefit from referral to pysch, will discuss in future.  Problem # 4:  TOBACCO ABUSE (ICD-305.1) Assessment: Comment Only Patient was counseled on smoking cessation strategies including medications and behavior modification options. Does not want to quit and says that she smokes about 2-3 cigs a day and sometimes does not smoke at all.  Problem # 5:  ESSENTIAL HYPERTENSION, BENIGN (ICD-401.1) Assessment: Comment Only Well controlled. Will not add  anything at this time. Her updated medication list for this problem includes:    Lisinopril 10 Mg Tabs (Lisinopril) .Marland Kitchen... Take 1 tablet by mouth once a day  BP today: 140/88 Prior BP: 135/79 (06/02/2010)  Labs Reviewed: K+: 3.8 (03/17/2010) Creat: : 0.65 (03/17/2010)   Chol: 182 (03/17/2010)   HDL: 35 (03/17/2010)   LDL: 113 (03/17/2010)   TG: 168 (03/17/2010)  Problem # 6:  PREVENTIVE HEALTH CARE (ICD-V70.0) Assessment: Comment Only Pap smear with GYN next week. Mammogram due and is aware, wants to schedule it herself.  Complete Medication List: 1)  Lisinopril 10 Mg Tabs (Lisinopril) .... Take 1 tablet by mouth once a day 2)  Pravachol 40 Mg Tabs (Pravastatin sodium) .... Take 1 pill by mouth daily. 3)  Metformin Hcl 500 Mg Tabs (Metformin hcl) .... Take 1 tablet by mouth two times a day 4)  Truetrack Test Strp (Glucose blood) .... Use to check blood sugar 2 hours after meals 5)  Lancets Misc (Lancets) .... Use to check blood sugars 2 hours after meals 6)  Vitamin B-12 500 Mcg Tabs (Cyanocobalamin) .... Take 1 tablet by mouth once a day 7)  Percocet 7.5-500 Mg Tabs (Oxycodone-acetaminophen) .... Take 1 tablet every 8 hours as needed for pain 8)  Gabapentin 300 Mg Caps (Gabapentin) .... Take 1 tablet by mouth three times a day 9)  Amitriptyline Hcl 50 Mg Tabs (Amitriptyline hcl) .... At bedtime  Patient Instructions: 1)  Please schedule a follow-up appointment in 2 months. 2)  Please schedule a follow-up appointment as needed. 3)  Tobacco is very bad for your health and your loved ones! You Should stop smoking!. 4)  Stop Smoking Tips: Choose a Quit date. Cut down before the Quit date. decide what you will do as a substitute when you feel the urge to smoke(gum,toothpick,exercise). 5)  It is important that you exercise regularly at least 20 minutes 5 times a week. If you develop chest pain, have severe difficulty breathing, or feel very tired , stop exercising immediately and seek  medical attention. 6)  You need to lose weight. Consider a lower calorie diet and regular exercise.  Prescriptions: AMITRIPTYLINE HCL 50 MG TABS (AMITRIPTYLINE HCL) at bedtime  #30 x 1   Entered and Authorized by:  Lars Mage MD   Signed by:   Lars Mage MD on 06/22/2010   Method used:   Print then Give to Patient   RxID:   [id number] GABAPENTIN 300 MG CAPS (GABAPENTIN) Take 1 tablet by mouth three times a day  #90 x 1   Entered and Authorized by:   Lars Mage MD   Signed by:   Lars Mage MD on 06/22/2010   Method used:   Print then Give to Patient   RxID:   [id number] PERCOCET 7.5-500 MG TABS (OXYCODONE-ACETAMINOPHEN) take 1 tablet every 8 hours as needed for pain  #60 x 0   Entered and Authorized by:   Lars Mage MD   Signed by:   Lars Mage MD on 06/22/2010   Method used:   Print then Give to Patient   RxID:   [id number] PERCOCET 7.5-500 MG TABS (OXYCODONE-ACETAMINOPHEN) take 1 tablet every 8 hours as needed for pain  #60 x 0   Entered and Authorized by:   Lars Mage MD   Signed by:   Lars Mage MD on 06/22/2010   Method used:   Print then Give to Patient   RxID:   [id number] AMITRIPTYLINE HCL 50 MG TABS (AMITRIPTYLINE HCL) at bedtime  #30 x 1   Entered and Authorized by:   Lars Mage MD   Signed by:   Lars Mage MD on 06/22/2010   Method used:   Print then Give to Patient   RxID:   [id number] GABAPENTIN 300 MG CAPS (GABAPENTIN) Take 1 tablet by mouth three times a day  #90 x 1   Entered and Authorized by:   Lars Mage MD   Signed by:   Lars Mage MD on 06/22/2010   Method used:   Print then Give to Patient   RxID:   [id number]  Process Orders Check Orders Results:     [facility]: Check successful Tests Sent for requisitioning (June 23, 2010 3:44 PM):     06/22/2010: [facility] -- T-Drug Screen-Urine, (single) [phone] (signed)     Prevention & Chronic Care Immunizations   Influenza vaccine:  Fluvax MCR  (06/02/2010)   Influenza vaccine deferral: Not indicated  (12/24/2009)   Influenza vaccine due: 05/14/2010    Tetanus booster: Not documented   Td booster deferral: Not indicated  (06/11/2009)   Tetanus booster due: 04/15/2020    Pneumococcal vaccine: Not documented  Other Screening   Pap smear: Not documented   Pap smear action/deferral: Deferred  (06/22/2010)    Mammogram: ASSESSMENT: Negative - BI-RADS 1^MM DIGITAL SCREENING  (06/16/2009)   Mammogram action/deferral: Deferred  (06/22/2010)   Smoking status: current  (06/22/2010)   Smoking cessation counseling: yes  (06/22/2010)  Diabetes Mellitus   HgbA1C: 6.9  (06/02/2010)   Hemoglobin A1C due: 10/16/2010    Eye exam: Not documented   Diabetic eye exam action/deferral: Ophthalmology referral  (04/15/2010)    Foot exam: yes  (06/02/2010)   Foot exam action/deferral: Do today   High risk foot: No  (04/15/2010)   Foot care education: Done  (04/15/2010)    Urine microalbumin/creatinine ratio: 4.1  (07/01/2009)   Urine microalbumin action/deferral: Deferred    Diabetes flowsheet reviewed?: Yes   Progress toward A1C goal: At goal  Lipids   Total Cholesterol: 182  (03/17/2010)   Lipid panel action/deferral: LDL Direct Ordered   LDL: 258  (03/17/2010)   LDL Direct: Not documented   HDL: 35  (03/17/2010)   Triglycerides: 168  (03/17/2010)  SGOT (AST): 16  (07/01/2009)   SGPT (ALT): 13  (07/01/2009)   Alkaline phosphatase: 70  (07/01/2009)   Total bilirubin: 0.3  (07/01/2009)    Lipid flowsheet reviewed?: Yes   Progress toward LDL goal: Improved  Hypertension   Last Blood Pressure: 140 / 88  (06/22/2010)   Serum creatinine: 0.65  (03/17/2010)   BMP action: Deferred   Serum potassium 3.8  (03/17/2010)    Hypertension flowsheet reviewed?: Yes   Progress toward BP goal: At goal  Self-Management Support :   Personal Goals (by the next clinic visit) :     Personal A1C goal: 7  (06/11/2009)      Personal blood pressure goal: 140/90  (06/25/2009)     Personal LDL goal: 100  (06/11/2009)    Diabetes self-management support: Written self-care plan  (06/22/2010)   Diabetes care plan printed   Last diabetes self-management training by diabetes educator: 06/11/2009    Hypertension self-management support: Written self-care plan  (06/22/2010)   Hypertension self-care plan printed.    Lipid self-management support: Written self-care plan  (06/22/2010)   Lipid self-care plan printed.   Nursing Instructions: Schedule screening mammogram (see order)    Process Orders Check Orders Results:     [facility]: Check successful Tests Sent for requisitioning (June 23, 2010 3:44 PM):     06/22/2010: [facility] -- T-Drug Screen-Urine, (single) [[id number]] (signed)

## 2010-10-13 NOTE — Letter (Signed)
Summary: DIABETES SUPPLIES  DIABETES SUPPLIES   Imported By: Margie Billet 01/27/2010 14:50:24  _____________________________________________________________________  External Attachment:    Type:   Image     Comment:   External Document

## 2010-10-13 NOTE — Assessment & Plan Note (Signed)
Summary: ACUTE-REQUESTING PAIN MEDICATIONS/CFB([facility])/CFB   Vital Signs:  Patient profile:   50 year old female Height:      65 inches Weight:      160.8 pounds BMI:     26.86 Temp:     97.1 degrees F oral Pulse rate:   84 / minute BP sitting:   134 / 89  (right arm)  Vitals Entered By: Filomena Jungling NT II (April 15, 2010 10:27 AM) CC: NEED REFILLS, Depression Is Patient Diabetic? Yes Did you bring your meter with you today? No Pain Assessment Patient in pain? yes     Location: SHOULDERS, LEGS Intensity: 4 Type: aching Onset of pain  Chronic  Does patient need assistance? Functional Status Self care Ambulation Normal   Diabetic Foot Exam Last Podiatry Exam Date: 02/13/2010 Foot Inspection Is there a history of a foot ulcer?              No Is there a foot ulcer now?              No Is there swelling or an abnormal foot shape?          No Are the toenails long?                No Are the toenails thick?                No Are the toenails ingrown?              No Is there heavy callous build-up?              No Is there pain in the calf muscle (Intermittent claudication) when walking?    NoIs there a claw toe deformity?              No Is there elevated skin temperature?            No Is there limited ankle dorsiflexion?            No Is there foot or ankle muscle weakness?            No  Diabetic Foot Care Education Patient educated on appropriate care of diabetic feet.  Pulse Check          Right Foot          Left Foot Posterior Tibial:        normal            normal Dorsalis Pedis:        normal            normal  High Risk Feet? No   10-g (5.07) Semmes-Weinstein Monofilament Test           Right Foot          Left Foot Visual Inspection               Test Control      normal         normal Site 1         normal         normal Site 2         normal         normal Site 3         normal         normal Site 4         normal         normal Site 5  normal         normal Site 6         normal         normal Site 7         normal         normal Site 8         normal         normal Site 9         normal         normal Site 10         normal         normal  Impression      normal         normal  Legend:  Site 1 = Plantar aspect of first toe (center of pad) Site 2 = Plantar aspect of third toe (center of pad) Site 3 = Plantar aspect of fifth toe (center of pad) Site 4 = Plantar aspect of first metatarsal head Site 5 = Plantar aspect of third metatarsal head Site 6 = Plantar aspect of fifth metatarsal head Site 7 = Plantar aspect of medial midfoot Site 8 = Plantar aspect of lateral midfoot Site 9 = Plantar aspect of heel Site 10 = dorsal aspect of foot between the base of the first and second toes   Result is Abnormal if patient was unable to perceive the monofilament at site indicated.    Primary Care Provider:  Lars Mage MD  CC:  NEED REFILLS and Depression.  History of Present Illness: 1. RF on Percocet for left shoulder pain. 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 Bonnetta Barry rris on both shoulders: Right:2004.2005; Left: 2007. Patient refused a f/u surgery on left shoulder. Underwent PT for several years. Pain is charp and chronic, today is 4/10 in intensity; and gets "above 10 when wakes up in am."  Depression History:      The patient denies a depressed mood most of the day and a diminished interest in her usual daily activities.         Preventive Screening-Counseling & Management  Alcohol-Tobacco     Alcohol drinks/day: 0     Smoking Status: current     Smoking Cessation Counseling: yes     Packs/Day: 2-3 cigs per day     Year Started: 1990     Pack years: 70  Caffeine-Diet-Exercise     Does Patient Exercise: yes     Type of exercise: walking- dogs  Problems Prior to Update: 1)  Knee Pain, Bilateral  (ICD-719.46) 2)  Hyperlipidemia  (ICD-272.4) 3)  Preventive Health Care   (ICD-V70.0) 4)  Depression, Major  (ICD-296.20) 5)  Tobacco Abuse  (ICD-305.1) 6)  Rotator Cuff Repair, Hx of  (ICD-V45.89) 7)  Essential Hypertension, Benign  (ICD-401.1) 8)  Dm  (ICD-250.00)  Current Problems (verified): 1)  Knee Pain, Bilateral  (ICD-719.46) 2)  Hyperlipidemia  (ICD-272.4) 3)  Preventive Health Care  (ICD-V70.0) 4)  Depression, Major  (ICD-296.20) 5)  Tobacco Abuse  (ICD-305.1) 6)  Rotator Cuff Repair, Hx of  (ICD-V45.89) 7)  Essential Hypertension, Benign  (ICD-401.1) 8)  Dm  (ICD-250.00)  Medications Prior to Update: 1)  Lisinopril 10 Mg Tabs (Lisinopril) .... Take 1 Tablet By Mouth Once A Day 2)  Pravachol 40 Mg Tabs (Pravastatin Sodium) .... Take 1 Pill By Mouth Daily. 3)  Metformin Hcl 500 Mg Tabs (Metformin Hcl) .... Take 1 Tablet By Mouth Two  Times A Day 4)  Truetrack Test  Strp (Glucose Blood) .... Use To Check Blood Sugar 2 Hours After Meals 5)  Lancets  Misc (Lancets) .... Use To Check Blood Sugars 2 Hours After Meals 6)  Vitamin B-12 500 Mcg Tabs (Cyanocobalamin) .... Take 1 Tablet By Mouth Once A Day 7)  Percocet 7.5-500 Mg Tabs (Oxycodone-Acetaminophen) .... Take 1 Tablet Every 8 Hours As Needed For Pain  Current Medications (verified): 1)  Lisinopril 10 Mg Tabs (Lisinopril) .... Take 1 Tablet By Mouth Once A Day 2)  Pravachol 40 Mg Tabs (Pravastatin Sodium) .... Take 1 Pill By Mouth Daily. 3)  Metformin Hcl 500 Mg Tabs (Metformin Hcl) .... Take 1 Tablet By Mouth Two Times A Day 4)  Truetrack Test  Strp (Glucose Blood) .... Use To Check Blood Sugar 2 Hours After Meals 5)  Lancets  Misc (Lancets) .... Use To Check Blood Sugars 2 Hours After Meals 6)  Vitamin B-12 500 Mcg Tabs (Cyanocobalamin) .... Take 1 Tablet By Mouth Once A Day 7)  Percocet 7.5-500 Mg Tabs (Oxycodone-Acetaminophen) .... Take 1 Tablet Every 8 Hours As Needed For Pain  Allergies (verified): 1)  ! Keflex  Directives (verified): 1)  Full Code   Past History:  Past Medical  History: Last updated: 02/13/2010 Diabetes, well controlled on metformin HTN HLD chronic shoulder pain  Family History: Last updated: 2009/10/20 Mother died of cervical cancer father died of Stroke at age 30.  Social History: Last updated: 04/15/2010 Recently moved to [place] from [place]. 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 Single Current Smoker Drug use-no on disability Completed CNA and cosmetology vourses after high school  Risk Factors: Alcohol Use: 0 (04/15/2010) Exercise: yes (04/15/2010)  Risk Factors: Smoking Status: current (04/15/2010) Packs/Day: 2-3 cigs per day (04/15/2010)  Past Surgical History: rotator cuff repair in 2004, 2005; left 2007  Family History: Reviewed history from 10-20-09 and no changes required. Mother died of cervical cancer father died of Stroke at age 26.  Social History: Reviewed history from 2009/10/20 and no changes required. Recently moved to [place] from [place]. 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 Single Current Smoker Drug use-no on disability Completed CNA and cosmetology vourses after high school  Review of Systems       per HPI  Physical Exam  General:  alert, normal appearance, and cooperative to examination.   Head:  normocephalic and atraumatic.   Eyes:  pupils equal, pupils round, and pupils reactive to light.  anicteric Ears:  no external deformities.  no external deformities.   Nose:  External nasal examination shows no deformity or inflammation. Nasal mucosa are pink and moist without lesions or exudates. Mouth:  Oral mucosa and oropharynx without lesions or exudates.  Teeth in good repair. Neck:  supple, no JVD, and no carotid bruits.   Chest Wall:  No deformities, masses, or tenderness noted. Lungs:  Normal respiratory effort, chest expands symmetrically. Lungs are clear to auscultation, no crackles or wheezes. Heart:  Normal rate and regular rhythm. S1 and S2 normal without gallop, murmur,  click, rub or other extra sounds. Abdomen:  Bowel sounds positive,abdomen soft and non-tender without masses, organomegaly or hernias noted. Msk:  No deformity or scoliosis noted of thoracic or lumbar spine.  Generalized TTP to both shouders, worse at the infraspinatus muscle on the left. Pulses:  R and L carotid,radial,femoral,dorsalis pedis and posterior tibial pulses are full and equal bilaterally Extremities:  no edema Neurologic:  No cranial nerve deficits noted. Station and gait are  normal. Plantar reflexes are down-going bilaterally. DTRs are symmetrical throughout. Sensory, motor and coordinative functions appear intact. Skin:  Intact without suspicious lesions or rashes Cervical Nodes:  No lymphadenopathy noted Axillary Nodes:  No palpable lymphadenopathy Psych:  Cognition and judgment appear intact. Alert and cooperative with normal attention span and concentration. No apparent delusions, illusions, hallucinations  Diabetes Management Exam:    Foot Exam (with socks and/or shoes not present):       Sensory-Pinprick/Light touch:          Left medial foot (L-4): normal          Left dorsal foot (L-5): normal          Left lateral foot (S-1): normal          Right medial foot (L-4): normal          Right dorsal foot (L-5): normal          Right lateral foot (S-1): normal       Sensory-Monofilament:          Left foot: normal          Right foot: normal       Inspection:          Left foot: normal          Right foot: normal       Nails:          Left foot: normal          Right foot: normal   Impression & Recommendations:  Problem # 1:  PREVENTIVE HEALTH CARE (ICD-V70.0) will come back for a WWE; TDAP today; mamogram is due in 06/2010.  Problem # 2:  TOBACCO ABUSE (ICD-305.1)  Encouraged smoking cessation and discussed different methods for smoking cessation.   Orders: Social Work Referral (Social )  Problem # 3:  ROTATOR CUFF REPAIR, HX OF (ICD-V45.89) Patient is on  chronic Tx with a percocet. Discussed with the patient risk of addiction and sedation.Patient verbalized understanidng.  Refuses to go bck to PT.   Problem # 4:  DEPRESSION, MAJOR (ICD-296.20) No SI/HI or mania. Will start on Celexa and follow up in 4-6 weeks. Side-effects reviewed with the patient.  Problem # 5:  ESSENTIAL HYPERTENSION, BENIGN (ICD-401.1)  Improved control. Her updated medication list for this problem includes:    Lisinopril 10 Mg Tabs (Lisinopril) .Marland Kitchen... Take 1 tablet by mouth once a day  BP today: 134/89 Prior BP: 158/92 (03/17/2010)  Labs Reviewed: K+: 3.8 (03/17/2010) Creat: : 0.65 (03/17/2010)   Chol: 182 (03/17/2010)   HDL: 35 (03/17/2010)   LDL: 113 (03/17/2010)   TG: 168 (03/17/2010)  Her updated medication list for this problem includes:    Lisinopril 10 Mg Tabs (Lisinopril) .Marland Kitchen... Take 1 tablet by mouth once a day  Complete Medication List: 1)  Lisinopril 10 Mg Tabs (Lisinopril) .... Take 1 tablet by mouth once a day 2)  Pravachol 40 Mg Tabs (Pravastatin sodium) .... Take 1 pill by mouth daily. 3)  Metformin Hcl 500 Mg Tabs (Metformin hcl) .... Take 1 tablet by mouth two times a day 4)  Truetrack Test Strp (Glucose blood) .... Use to check blood sugar 2 hours after meals 5)  Lancets Misc (Lancets) .... Use to check blood sugars 2 hours after meals 6)  Vitamin B-12 500 Mcg Tabs (Cyanocobalamin) .... Take 1 tablet by mouth once a day 7)  Percocet 7.5-500 Mg Tabs (Oxycodone-acetaminophen) .... Take 1 tablet every 8 hours as needed for pain  8)  Citalopram Hydrobromide 10 Mg Tabs (Citalopram hydrobromide) .... Take 1 tablet by mouth once a day  Patient Instructions: 1)  Please, start taking Celexa as prescribed. 2)  Call with any concerns. 3)  Please, follow up in 4-6 weeks or sooner if needed. 4)  Please, make a separte appointment with me for a PAP. Prescriptions: CITALOPRAM HYDROBROMIDE 10 MG TABS (CITALOPRAM HYDROBROMIDE) Take 1 tablet by mouth once a day   #30 x 6   Entered and Authorized by:   Deatra Robinson MD   Signed by:   Deatra Robinson MD on 04/15/2010   Method used:   Print then Give to Patient   RxID:   [id number] PERCOCET 7.5-500 MG TABS (OXYCODONE-ACETAMINOPHEN) take 1 tablet every 8 hours as needed for pain  #60 x 0   Entered and Authorized by:   Deatra Robinson MD   Signed by:   Deatra Robinson MD on 04/15/2010   Method used:   Print then Give to Patient   RxID:   [id number]     Prevention & Chronic Care Immunizations   Influenza vaccine: Not documented   Influenza vaccine deferral: Not indicated  (12/24/2009)   Influenza vaccine due: 05/14/2010    Tetanus booster: Not documented   Td booster deferral: Not indicated  (06/11/2009)   Tetanus booster due: 04/15/2020    Pneumococcal vaccine: Not documented  Other Screening   Pap smear: Not documented   Pap smear action/deferral: Ordered  (04/15/2010)    Mammogram: ASSESSMENT: Negative - BI-RADS 1^MM DIGITAL SCREENING  (06/16/2009)   Mammogram action/deferral: Ordered  (06/11/2009)   Smoking status: current  (04/15/2010)   Smoking cessation counseling: yes  (04/15/2010)  Diabetes Mellitus   HgbA1C: 6.9  (02/13/2010)   Hemoglobin A1C due: 10/16/2010    Eye exam: Not documented   Diabetic eye exam action/deferral: Ophthalmology referral  (04/15/2010)    Foot exam: yes  (04/15/2010)   Foot exam action/deferral: Do today   High risk foot: No  (04/15/2010)   Foot care education: Done  (04/15/2010)    Urine microalbumin/creatinine ratio: 4.1  (07/01/2009)   Urine microalbumin action/deferral: Deferred  Lipids   Total Cholesterol: 182  (03/17/2010)   Lipid panel action/deferral: LDL Direct Ordered   LDL: 841  (03/17/2010)   LDL Direct: Not documented   HDL: 35  (03/17/2010)   Triglycerides: 168  (03/17/2010)    SGOT (AST): 16  (07/01/2009)   SGPT (ALT): 13  (07/01/2009)   Alkaline phosphatase: 70  (07/01/2009)   Total bilirubin: 0.3   (07/01/2009)  Hypertension   Last Blood Pressure: 134 / 89  (04/15/2010)   Serum creatinine: 0.65  (03/17/2010)   BMP action: Deferred   Serum potassium 3.8  (03/17/2010)  Self-Management Support :   Personal Goals (by the next clinic visit) :     Personal A1C goal: 7  (06/11/2009)     Personal blood pressure goal: 140/90  (06/25/2009)     Personal LDL goal: 100  (06/11/2009)    Diabetes self-management support: Written self-care plan  (02/13/2010)   Last diabetes self-management training by diabetes educator: 06/11/2009    Hypertension self-management support: Written self-care plan  (02/13/2010)    Lipid self-management support: Written self-care plan  (02/13/2010)    Nursing Instructions: Give tetanus booster today Pap smear today Refer for screening diabetic eye exam (see order) Diabetic foot exam today

## 2010-10-13 NOTE — Assessment & Plan Note (Signed)
Summary: est-ck/fu/meds/cfb   Vital Signs:  Patient profile:   50 year old female Height:      65 inches (165.10 cm) Weight:      162.1 pounds (73.68 kg) BMI:     27.07 Temp:     97.0 degrees F (36.11 degrees C) oral Pulse rate:   94 / minute BP sitting:   158 / 92  (right arm) Cuff size:   regular  Vitals Entered By: Theotis Barrio NT II (March 17, 2010 10:19 AM) CC: ROUTINE OFFICE VISIT /  BILATERAL LEG / ARM PAIN, Is Patient Diabetic? Yes Did you bring your meter with you today? No Pain Assessment Patient in pain? yes     Location: LEGS/ARMS Intensity:      4  /  5  Have you ever been in a relationship where you felt threatened, hurt or afraid?No   Does patient need assistance? Functional Status Self care Ambulation Normal Comments BIALTERAL LAG AND ARM PAIN   Primary Care Provider:  Lars Mage MD  CC:  ROUTINE OFFICE VISIT /  BILATERAL LEG / ARM PAIN and .  History of Present Illness: patient is 50 yo women with PMH as described in EMR is here today for a follow up appointment.  Her Hba1c was rising from 6.6 last year to 6.9 last visit. She has started taking her metformin now but still on 500mg  daily. I asked her to change the dose to 500 two times a day and then eventually to 1000mg  two times a day.  Blood pressure is elevated today but she did not take her BP meds this morning and also repeat BP was 148/94. No changes inmeds required.  She complains of shoulder pain which is still 4-5/10 releived with pain meds. Shje stopped doing PT as she says that doesnt help much. She needs a refill of her meds.  She also complains of Pain in her knees b/i 4-5/10 which is aggravted by walking and relieved with rest and pain meds. She was told 3 years ago that she has osteoarthritis and she tried injections which did not help alot.   Preventive ehalth care is uptodate and i will make another appointment with her gyn today as she did not keep the LAST APPOINTMENT.  No othet  compalints today.    Depression History:      The patient denies a depressed mood most of the day and a diminished interest in her usual daily activities.         Preventive Screening-Counseling & Management  Alcohol-Tobacco     Alcohol drinks/day: 0     Smoking Status: current     Smoking Cessation Counseling: yes     Packs/Day: 2-3 cigs per day     Year Started: 1990     Pack years: 40  Caffeine-Diet-Exercise     Does Patient Exercise: yes     Type of exercise: walking- dogs  Problems Prior to Update: 1)  Hyperlipidemia  (ICD-272.4) 2)  Preventive Health Care  (ICD-V70.0) 3)  Depression, Major  (ICD-296.20) 4)  Tobacco Abuse  (ICD-305.1) 5)  Rotator Cuff Repair, Hx of  (ICD-V45.89) 6)  Essential Hypertension, Benign  (ICD-401.1) 7)  Dm  (ICD-250.00)  Medications Prior to Update: 1)  Lisinopril 10 Mg Tabs (Lisinopril) .... Take 1 Tablet By Mouth Once A Day 2)  Pravachol 40 Mg Tabs (Pravastatin Sodium) .... Take 1 Pill By Mouth Daily. 3)  Metformin Hcl 500 Mg Tabs (Metformin Hcl) .... Take 1  Tablet By Mouth Once A Day 4)  Truetrack Test  Strp (Glucose Blood) .... Use To Check Blood Sugar 2 Hours After Meals 5)  Lancets  Misc (Lancets) .... Use To Check Blood Sugars 2 Hours After Meals 6)  Vitamin B-12 500 Mcg Tabs (Cyanocobalamin) .... Take 1 Tablet By Mouth Once A Day 7)  Percocet 7.5-500 Mg Tabs (Oxycodone-Acetaminophen) .... Take 1 Tablet Every 8 Hours As Needed For Pain  Current Medications (verified): 1)  Lisinopril 10 Mg Tabs (Lisinopril) .... Take 1 Tablet By Mouth Once A Day 2)  Pravachol 40 Mg Tabs (Pravastatin Sodium) .... Take 1 Pill By Mouth Daily. 3)  Metformin Hcl 500 Mg Tabs (Metformin Hcl) .... Take 1 Tablet By Mouth Two Times A Day 4)  Truetrack Test  Strp (Glucose Blood) .... Use To Check Blood Sugar 2 Hours After Meals 5)  Lancets  Misc (Lancets) .... Use To Check Blood Sugars 2 Hours After Meals 6)  Vitamin B-12 500 Mcg Tabs (Cyanocobalamin) .... Take  1 Tablet By Mouth Once A Day 7)  Percocet 7.5-500 Mg Tabs (Oxycodone-Acetaminophen) .... Take 1 Tablet Every 8 Hours As Needed For Pain  Allergies (verified): 1)  ! Keflex  Past History:  Past Medical History: Last updated: 02/13/2010 Diabetes, well controlled on metformin HTN HLD chronic shoulder pain  Past Surgical History: Last updated: 06/11/2009 rotator cuff repair in 2004.  Family History: Last updated: 2009-10-03 Mother died of cervical cancer father died of Stroke at age 42.  Social History: Last updated: 10-03-2009 Recently moved to [place] from [place]. 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 Single Current Smoker Drug use-no  Risk Factors: Alcohol Use: 0 (03/17/2010) Exercise: yes (03/17/2010)  Risk Factors: Smoking Status: current (03/17/2010) Packs/Day: 2-3 cigs per day (03/17/2010)  Family History: Reviewed history from 2009/10/03 and no changes required. Mother died of cervical cancer father died of Stroke at age 13.  Social History: Reviewed history from 10/03/2009 and no changes required. Recently moved to [place] from [place]. 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 Single Current Smoker Drug use-no  Review of Systems      See HPI  Physical Exam  Additional Exam:  Gen: AOx3, in no acute distress Eyes: PERRL, EOMI ENT:MMM, No erythema noted in posterior pharynx Neck: No JVD, No LAP Chest: CTAB with  good respiratory effort CVS: regular rhythmic rate, NO M/R/G, S1 S2 normal Abdo: soft,ND, BS+x4, Non tender and No hepatosplenomegaly EXT: No odema noted Neuro: Non focal, gait is normal Skin: no rashes noted.    Impression & Recommendations:  Problem # 1:  HYPERLIPIDEMIA (ICD-272.4) Assessment Comment Only Will check FLP today. She has been taking 40mg  pravastatin and her goal would be <100 if not <70 as she is Diabetic with strong family history of atherosclerotic disease. Her updated medication list for this problem includes:     Pravachol 40 Mg Tabs (Pravastatin sodium) .Marland Kitchen... Take 1 pill by mouth daily.  Orders: T-Lipid Profile ([id number])  Problem # 2:  PREVENTIVE HEALTH CARE (ICD-V70.0) Assessment: Comment Only Gyn referral for a PAP smear and routine gyn exam. Orders: Gynecologic Referral (Gyn)  Problem # 3:  ESSENTIAL HYPERTENSION, BENIGN (ICD-401.1) Assessment: Deteriorated Patient did not take het BP meds today. The recheck BP by me was 148/94. I will not restart any new meds. Discussed weight loss, exercise and diet. Her updated medication list for this problem includes:    Lisinopril 10 Mg Tabs (Lisinopril) .Marland Kitchen... Take 1 tablet by mouth once a day  Orders: T-Basic Metabolic  Panel [id number])  BP today: 158/92 Prior BP: 132/92 (02/13/2010)  Labs Reviewed: K+: 4.0 (07/01/2009) Creat: : 0.75 (07/01/2009)   Chol: 218 (07/01/2009)   HDL: 35 (07/01/2009)   LDL: 139 (07/01/2009)   TG: 220 (07/01/2009)  Problem # 4:  DM (ICD-250.00) Assessment: Deteriorated Will work her way upto 1000mg  two times a day of metformin as discusse din HPI. Optha exam is uptodate. Has seen Jamison Neighbor in the past. Will refer again in next visit. She is snot using her meter as she should be doing and i encoutaged to use it and bring it every time to the clinic.  Her updated medication list for this problem includes:    Lisinopril 10 Mg Tabs (Lisinopril) .Marland Kitchen... Take 1 tablet by mouth once a day    Metformin Hcl 500 Mg Tabs (Metformin hcl) .Marland Kitchen... Take 1 tablet by mouth two times a day  Orders: T-Basic Metabolic Panel [id number]) T-Lipid Profile [phone])  Labs Reviewed: Creat: 0.75 (07/01/2009)    Reviewed HgBA1c results: 6.9 (02/13/2010)  6.6 (11/11/2009)  Problem # 5:  ROTATOR CUFF REPAIR, HX OF (ICD-V45.89) Assessment: Unchanged Continue pain meds and pT as recommended by the orthopedic doctor Dr Ranell Patrick. The non narcotic meds have failed and we will continue her on percocet for now.  Problem # 6:  TOBACCO  ABUSE (ICD-305.1) Assessment: Comment Only Not interested in quitting at this time. Encouraged smoking cessation and discussed different methods for smoking cessation.   Problem # 7:  KNEE PAIN, BILATERAL (ICD-719.46) Assessment: New  I discussed exercises which can make the muscles around the knee joint stronger to stabilize the joint. The pain meds that she is on will cover her for the knee pain as well. We discussed about knee injections which she denied at this time. Her updated medication list for this problem includes:    Percocet 7.5-500 Mg Tabs (Oxycodone-acetaminophen) .Marland Kitchen... Take 1 tablet every 8 hours as needed for pain  Discussed strengthening exercises, use of ice or heat, and medications.   Complete Medication List: 1)  Lisinopril 10 Mg Tabs (Lisinopril) .... Take 1 tablet by mouth once a day 2)  Pravachol 40 Mg Tabs (Pravastatin sodium) .... Take 1 pill by mouth daily. 3)  Metformin Hcl 500 Mg Tabs (Metformin hcl) .... Take 1 tablet by mouth two times a day 4)  Truetrack Test Strp (Glucose blood) .... Use to check blood sugar 2 hours after meals 5)  Lancets Misc (Lancets) .... Use to check blood sugars 2 hours after meals 6)  Vitamin B-12 500 Mcg Tabs (Cyanocobalamin) .... Take 1 tablet by mouth once a day 7)  Percocet 7.5-500 Mg Tabs (Oxycodone-acetaminophen) .... Take 1 tablet every 8 hours as needed for pain  Patient Instructions: 1)  Please schedule a follow-up appointment in 6 months. 2)  Tobacco is very bad for your health and your loved ones! You Should stop smoking!. 3)  Stop Smoking Tips: Choose a Quit date. Cut down before the Quit date. decide what you will do as a substitute when you feel the urge to smoke(gum,toothpick,exercise). 4)  It is important that you exercise regularly at least 20 minutes 5 times a week. If you develop chest pain, have severe difficulty breathing, or feel very tired , stop exercising immediately and seek medical attention. 5)  You need  to lose weight. Consider a lower calorie diet and regular exercise.  6)  Check your blood sugars regularly. If your readings are usually above : or below 70 you should  contact our office. 7)  It is important that your Diabetic A1c level is checked every 3 months. 8)  See your eye doctor yearly to check for diabetic eye damage. 9)  Check your Blood Pressure regularly. If it is above: you should make an appointment. 10)  Limit your Sodium (Salt). 11)  Your metformin has been incresed to 500 mg twice daily and you should start with 1000mg  twice daily if are able to tolerate after a week. Prescriptions: PERCOCET 7.5-500 MG TABS (OXYCODONE-ACETAMINOPHEN) take 1 tablet every 8 hours as needed for pain  #60 x 0   Entered and Authorized by:   Lars Mage MD   Signed by:   Lars Mage MD on 03/17/2010   Method used:   Print then Give to Patient   RxID:   [id number] METFORMIN HCL 500 MG TABS (METFORMIN HCL) Take 1 tablet by mouth two times a day  #60 x 0   Entered and Authorized by:   Lars Mage MD   Signed by:   Lars Mage MD on 03/17/2010   Method used:   Print then Give to Patient   RxID:   [id number]  Process Orders Check Orders Results:     [facility]: Check successful Tests Sent for requisitioning (March 17, 2010 10:36 AM):     03/17/2010: [facility] -- T-Basic Metabolic Panel [id number] (signed)     03/17/2010: [facility] -- T-Lipid Profile [id number] (signed)

## 2010-10-13 NOTE — Medication Information (Signed)
Summary: [facility] [employer]  [facility] [employer]   Imported By: Florinda Marker 10/02/2009 15:29:25  _____________________________________________________________________  External Attachment:    Type:   Image     Comment:   External Document

## 2010-10-13 NOTE — Medication Information (Signed)
Summary: PERCOCET  PERCOCET   Imported By: Margie Billet 05/26/2010 11:41:32  _____________________________________________________________________  External Attachment:    Type:   Image     Comment:   External Document

## 2010-10-13 NOTE — Progress Notes (Signed)
Summary: refill/ hla  Phone Note Refill Request Message from:  Patient on May 20, 2010 4:17 PM  Refills Requested: Medication #1:  PERCOCET 7.5-500 MG TABS take 1 tablet every 8 hours as needed for pain   Dosage confirmed as above?Dosage Confirmed   Brand Name Necessary? No   Supply Requested: 1 month   Last Refilled: 8/3 Initial call taken by: Marin Roberts RN,  May 20, 2010 4:17 PM  Follow-up for Phone Call        Rx written and will be put in teh Rx room basket. Thanks. Follow-up by: Lars Mage MD,  May 20, 2010 5:27 PM  Additional Follow-up for Phone Call Additional follow up Details #1::        Prescription reprinted and signed - nurse to complete.  Please notify Dr. Eben Burow to destroy the handwritten prscription. Additional Follow-up by: Margarito Liner MD,  May 21, 2010 11:21 AM    Additional Follow-up for Phone Call Additional follow up Details #2::    Prescription given to pt. Angelina Ok RN  May 21, 2010 11:26 AM.    Prescriptions: PERCOCET 7.5-500 MG TABS (OXYCODONE-ACETAMINOPHEN) take 1 tablet every 8 hours as needed for pain  #60 x 0   Entered and Authorized by:   Margarito Liner MD   Signed by:   Margarito Liner MD on 05/21/2010   Method used:   Reprint   RxID:   [id number] PERCOCET 7.5-500 MG TABS (OXYCODONE-ACETAMINOPHEN) take 1 tablet every 8 hours as needed for pain  #60 x 0   Entered and Authorized by:   Lars Mage MD   Signed by:   Lars Mage MD on 05/20/2010   Method used:   Handwritten   RxID:   [id number]  Unable to locate prescription in the Med Room Prescription box as stated per Dr. Eben Burow.  Dr. Eben Burow is off today.  Dr. Meredith Pel was asked to reprint the prescription for the patient. Angelina Ok RN  May 21, 2010 11:08 AM

## 2010-10-13 NOTE — Miscellaneous (Signed)
Summary: Medication Contract  Medication Contract   Imported By: Florinda Marker 11/03/2009 14:57:23  _____________________________________________________________________  External Attachment:    Type:   Image     Comment:   External Document

## 2010-10-13 NOTE — Progress Notes (Signed)
Summary: Refill/gh  Phone Note Refill Request   Refills Requested: Medication #1:  PERCOCET 7.5-500 MG TABS take 1 tablet every 8 hours as needed for pain   Dosage confirmed as above?Dosage Confirmed   Brand Name Necessary? No   Supply Requested: 1 month  Method Requested: Electronic Initial call taken by: Angelina Ok RN,  June 16, 2010 3:07 PM  Follow-up for Phone Call        Refill on next appointment day with [facility] and pain contract.  Thanks Follow-up by: Lars Mage MD,  June 17, 2010 1:52 PM  Additional Follow-up for Phone Call Additional follow up Details #1::        I talked Dr. Eben Burow; pt. cannot receive Rx until Monday at her appt. w/[facility] and pain contract. Explained to pt.; she agreed. Additional Follow-up by: Chinita Pester RN,  June 17, 2010 3:37 PM    Additional Follow-up for Phone Call Additional follow up Details #2::    Noted  thanks Follow-up by: Lars Mage MD,  June 18, 2010 7:55 AM  Prescriptions: PERCOCET 7.5-500 MG TABS (OXYCODONE-ACETAMINOPHEN) take 1 tablet every 8 hours as needed for pain  #60 x 0   Entered and Authorized by:   Lars Mage MD   Signed by:   Lars Mage MD on 06/19/2010   Method used:   Handwritten   RxID:   [id number]

## 2010-10-13 NOTE — Letter (Signed)
Summary: [employer]  [employer]   Imported By: Shon Hough 06/02/2010 08:48:46  _____________________________________________________________________  External Attachment:    Type:   Image     Comment:   External Document

## 2010-10-13 NOTE — Consult Note (Signed)
Summary: [facility] OFF[facility]  [facility] OFFICE   Imported By: Louretta Parma 08/11/2010 16:44:38  _____________________________________________________________________  External Attachment:    Type:   Image     Comment:   External Document

## 2010-10-15 NOTE — Medication Information (Signed)
Summary: PERCOCET  PERCOCET   Imported By: Margie Billet 09/29/2010 11:23:33  _____________________________________________________________________  External Attachment:    Type:   Image     Comment:   External Document

## 2010-10-15 NOTE — Assessment & Plan Note (Signed)
Summary: EST-3 MONTH F/U VISIT/CH   Vital Signs:  Patient profile:   50 year old female Height:      65 inches (165.10 cm) Weight:      161.6 pounds (73.45 kg) BMI:     26.99 Temp:     98.6 degrees F (37.00 degrees C) oral Pulse rate:   95 / minute BP sitting:   134 / 90  (left arm) Cuff size:   large  Vitals Entered By: Cynda Familia Duncan Dull) (August 24, 2010 2:59 PM) CC: routine f/u, depressed-sister died ago Is Patient Diabetic? Yes Did you bring your meter with you today? Yes Pain Assessment Patient in pain? yes      Nutritional Status BMI of > 30 = obese CBG Result 103  Have you ever been in a relationship where you felt threatened, hurt or afraid?No   Does patient need assistance? Functional Status Self care Ambulation Normal   Primary Care Provider:  Lars Mage MD  CC:  routine f/u and depressed-sister died ago.  History of Present Illness: Ms Galvan is here today for a follow up appointment. Her sister recently passed away and she was under a lot of stress lately.  1. B/L rotator cuff tear: Has been present since "years". 4/10 at its worse. Pain in shoulder is worse with movement and is associated with tingling and numbness in her right hand. She is unable to sleep due to painsome times and is asking for some thing to help for nerve pain. Pain contract done last visit. Hasnt used alot of pain meds since last seen and has refilled the percocet last in Oct.  2. Diabetes: HBa1c 6.9 recently. Optha exam to be rescheduled today..   3. BP is well controlled.  4. Depression:Denies any suicidal ideation and does not need any help today.   She denies any new sicknesses or hospitalizations, no chest pain episodes, no fevers, no chills, no abdominal or urinary concerns. No recent changes in appetite, weight.  Depression History:      The patient is having a depressed mood most of the day but denies diminished interest in her usual daily activities.    Comments:  SISTER DIED LAST MONTH.   Preventive Screening-Counseling & Management  Alcohol-Tobacco     Alcohol drinks/day: 0     Smoking Status: current     Smoking Cessation Counseling: yes     Packs/Day: 2-3 cigs per day     Year Started: 1990     Pack years: 20  Problems Prior to Update: 1)  Knee Pain, Bilateral  (ICD-719.46) 2)  Hyperlipidemia  (ICD-272.4) 3)  Preventive Health Care  (ICD-V70.0) 4)  Depression, Major  (ICD-296.20) 5)  Tobacco Abuse  (ICD-305.1) 6)  Rotator Cuff Repair, Hx of  (ICD-V45.89) 7)  Essential Hypertension, Benign  (ICD-401.1) 8)  Dm  (ICD-250.00)  Medications Prior to Update: 1)  Lisinopril 10 Mg Tabs (Lisinopril) .... Take 1 Tablet By Mouth Once A Day 2)  Pravachol 40 Mg Tabs (Pravastatin Sodium) .... Take 1 Pill By Mouth Daily. 3)  Metformin Hcl 500 Mg Tabs (Metformin Hcl) .... Take 1 Tablet By Mouth Two Times A Day 4)  Truetrack Test  Strp (Glucose Blood) .... Use To Check Blood Sugar 2 Hours After Meals 5)  Lancets  Misc (Lancets) .... Use To Check Blood Sugars 2 Hours After Meals 6)  Vitamin B-12 500 Mcg Tabs (Cyanocobalamin) .... Take 1 Tablet By Mouth Once A Day 7)  Percocet 7.5-500 Mg  Tabs (Oxycodone-Acetaminophen) .... Take 1 Tablet Every 8 Hours As Needed For Pain 8)  Gabapentin 300 Mg Caps (Gabapentin) .... Take 1 Tablet By Mouth Three Times A Day 9)  Amitriptyline Hcl 50 Mg Tabs (Amitriptyline Hcl) .... At Bedtime  Current Medications (verified): 1)  Lisinopril 10 Mg Tabs (Lisinopril) .... Take 1 Tablet By Mouth Once A Day 2)  Pravachol 40 Mg Tabs (Pravastatin Sodium) .... Take 1 Pill By Mouth Daily. 3)  Metformin Hcl 500 Mg Tabs (Metformin Hcl) .... Take 1 Tablet By Mouth Two Times A Day 4)  Truetrack Test  Strp (Glucose Blood) .... Use To Check Blood Sugar 2 Hours After Meals 5)  Lancets  Misc (Lancets) .... Use To Check Blood Sugars 2 Hours After Meals 6)  Vitamin B-12 500 Mcg Tabs (Cyanocobalamin) .... Take 1 Tablet By Mouth Once  A Day 7)  Percocet 7.5-500 Mg Tabs (Oxycodone-Acetaminophen) .... Take 1 Tablet Every 8 Hours As Needed For Pain 8)  Gabapentin 300 Mg Caps (Gabapentin) .... Take 1 Tablet By Mouth Three Times A Day 9)  Amitriptyline Hcl 50 Mg Tabs (Amitriptyline Hcl) .... At Bedtime  Allergies: 1)  ! Keflex  Directives: 1)  Full Code   Past History:  Past Medical History: Last updated: 02/13/2010 Diabetes, well controlled on metformin HTN HLD chronic shoulder pain  Past Surgical History: Last updated: 04/15/2010 rotator cuff repair in 2004, 2005; left 2007  Family History: Last updated: 10-25-09 Mother died of cervical cancer father died of Stroke at age 27.  Social History: Last updated: 06/02/2010 Recently moved to [place] from [place]. 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 Single Current Smoker - 2 cig per day Drug use-no on disability Completed CNA and cosmetology courses after high school  Risk Factors: Alcohol Use: 0 (08/24/2010) Exercise: yes (06/02/2010)  Risk Factors: Smoking Status: current (08/24/2010) Packs/Day: 2-3 cigs per day (08/24/2010)  Family History: Reviewed history from 2009/10/25 and no changes required. Mother died of cervical cancer father died of Stroke at age 32.  Social History: Reviewed history from 06/02/2010 and no changes required. Recently moved to [place] from [place]. 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 Single Current Smoker - 2 cig per day Drug use-no on disability Completed CNA and cosmetology courses after high school  Review of Systems      See HPI  Physical Exam  Additional Exam:  Gen: AOx3, in no acute distress Eyes: PERRL, EOMI ENT:MMM, No erythema noted in posterior pharynx Neck: No JVD, No LAP Chest: CTAB with  good respiratory effort CVS: regular rhythmic rate, NO M/R/G, S1 S2 normal Abdo: soft,ND, BS+x4, Non tender and No hepatosplenomegaly EXT: No odema noted Neuro: Non focal, gait is normal Skin: no  rashes noted.    Impression & Recommendations:  Problem # 1:  ESSENTIAL HYPERTENSION, BENIGN (ICD-401.1) Assessment Unchanged She has been marginal every visit. We may need to go up on her lisinopril or add HCTZ the next visit if this remains elevated.  Her updated medication list for this problem includes:    Lisinopril 10 Mg Tabs (Lisinopril) .Marland Kitchen... Take 1 tablet by mouth once a day  BP today: 134/90 Prior BP: 140/88 (06/22/2010)  Labs Reviewed: K+: 3.8 (03/17/2010) Creat: : 0.65 (03/17/2010)   Chol: 182 (03/17/2010)   HDL: 35 (03/17/2010)   LDL: 113 (03/17/2010)   TG: 168 (03/17/2010)  Problem # 2:  TOBACCO ABUSE (ICD-305.1) Assessment: Unchanged Patient was counseled on smoking cessation strategies including medications and behavior modification options.   Problem # 3:  DEPRESSION, MAJOR (ICD-296.20) Assessment: Improved seems to be in better mood today. No SI or HI.  Problem # 4:  HYPERLIPIDEMIA (ICD-272.4) Assessment: Unchanged Will check Cmet today to check lfts and K and Crt as she is on lisinopril.  Her updated medication list for this problem includes:    Pravachol 40 Mg Tabs (Pravastatin sodium) .Marland Kitchen... Take 1 pill by mouth daily.  Orders: T-CMP with Estimated GFR ([address])  Labs Reviewed: SGOT: 16 (07/01/2009)   SGPT: 13 (07/01/2009)   HDL:35 (03/17/2010), 35 (07/01/2009)  LDL:113 (03/17/2010), 139 (07/01/2009)  Chol:182 (03/17/2010), 218 (07/01/2009)  Trig:168 (03/17/2010), 220 (07/01/2009)  Problem # 5:  DM (ICD-250.00) HBa1c 7 today. Will go up on metformin on the next visit to a mamx dose of 1000 two times a day. Optha referral done today.  Her updated medication list for this problem includes:    Lisinopril 10 Mg Tabs (Lisinopril) .Marland Kitchen... Take 1 tablet by mouth once a day    Metformin Hcl 500 Mg Tabs (Metformin hcl) .Marland Kitchen... Take 1 tablet by mouth two times a day  Orders: T- Capillary Blood Glucose (02725) T-Hgb A1C (in-house) (36644IH) Ophthalmology  Referral (Ophthalmology)  Labs Reviewed: Creat: 0.65 (03/17/2010)    Reviewed HgBA1c results: 7.0 (08/24/2010)  6.9 (06/02/2010)  Problem # 6:  Screening Cervical Cancer (ICD-V76.2) Assessment: Comment Only refused pap smear today as she was in a hurry.  Problem # 7:  PREVENTIVE HEALTH CARE (ICD-V70.0) Assessment: Comment Only Reviewed and she was referred for appropriate screening exams.  Complete Medication List: 1)  Lisinopril 10 Mg Tabs (Lisinopril) .... Take 1 tablet by mouth once a day 2)  Pravachol 40 Mg Tabs (Pravastatin sodium) .... Take 1 pill by mouth daily. 3)  Metformin Hcl 500 Mg Tabs (Metformin hcl) .... Take 1 tablet by mouth two times a day 4)  Truetrack Test Strp (Glucose blood) .... Use to check blood sugar 2 hours after meals 5)  Lancets Misc (Lancets) .... Use to check blood sugars 2 hours after meals 6)  Vitamin B-12 500 Mcg Tabs (Cyanocobalamin) .... Take 1 tablet by mouth once a day 7)  Percocet 7.5-500 Mg Tabs (Oxycodone-acetaminophen) .... Take 1 tablet every 8 hours as needed for pain 8)  Gabapentin 300 Mg Caps (Gabapentin) .... Take 1 tablet by mouth three times a day 9)  Amitriptyline Hcl 50 Mg Tabs (Amitriptyline hcl) .... At bedtime  Patient Instructions: 1)  Please schedule a follow-up appointment in 6 months. 2)  Tobacco is very bad for your health and your loved ones! You Should stop smoking!. 3)  Stop Smoking Tips: Choose a Quit date. Cut down before the Quit date. decide what you will do as a substitute when you feel the urge to smoke(gum,toothpick,exercise). 4)  You need to lose weight. Consider a lower calorie diet and regular exercise.  5)  Schedule your mammogram. 6)  You need to have a Pap Smear to prevent cervical cancer. Prescriptions: AMITRIPTYLINE HCL 50 MG TABS (AMITRIPTYLINE HCL) at bedtime  #30 x 12   Entered and Authorized by:   Lars Mage MD   Signed by:   Lars Mage MD on 08/24/2010   Method used:   Print then Give to Patient    RxID:   [id number] GABAPENTIN 300 MG CAPS (GABAPENTIN) Take 1 tablet by mouth three times a day  #90 x 1   Entered and Authorized by:   Lars Mage MD   Signed by:   Lars Mage MD on 08/24/2010   Method  used:   Print then Give to Patient   RxID:   [id number] METFORMIN HCL 500 MG TABS (METFORMIN HCL) Take 1 tablet by mouth two times a day  #60 x 11   Entered and Authorized by:   Lars Mage MD   Signed by:   Lars Mage MD on 08/24/2010   Method used:   Print then Give to Patient   RxID:   [id number] PRAVACHOL 40 MG TABS (PRAVASTATIN SODIUM) take 1 pill by mouth daily.  #30 x 11   Entered and Authorized by:   Lars Mage MD   Signed by:   Lars Mage MD on 08/24/2010   Method used:   Print then Give to Patient   RxID:   [id number] LISINOPRIL 10 MG TABS (LISINOPRIL) Take 1 tablet by mouth once a day  #30 x 11   Entered and Authorized by:   Lars Mage MD   Signed by:   Lars Mage MD on 08/24/2010   Method used:   Print then Give to Patient   RxID:   [id number]    Orders Added: 1)  T- Capillary Blood Glucose [82948] 2)  T-Hgb A1C (in-house) [33295JO] 3)  Ophthalmology Referral [Ophthalmology] 4)  T-CMP with Estimated GFR [[id number]] 5)  Est. Patient Level IV [84166]    Prevention & Chronic Care Immunizations   Influenza vaccine: Fluvax MCR  (06/02/2010)   Influenza vaccine deferral: Not indicated  (12/24/2009)   Influenza vaccine due: 05/14/2010    Tetanus booster: Not documented   Td booster deferral: Not indicated  (06/11/2009)   Tetanus booster due: 04/15/2020    Pneumococcal vaccine: Not documented  Other Screening   Pap smear: Not documented   Pap smear action/deferral: Refused  (08/24/2010)    Mammogram: ASSESSMENT: Negative - BI-RADS 1^MM DIGITAL SCREENING  (06/16/2009)   Mammogram action/deferral: Deferred  (08/24/2010)   Smoking status: current  (08/24/2010)   Smoking cessation counseling: yes  (08/24/2010)  Diabetes Mellitus    HgbA1C: 7.0  (08/24/2010)   Hemoglobin A1C due: 10/16/2010    Eye exam: Not documented   Diabetic eye exam action/deferral: Ophthalmology referral  (08/24/2010)    Foot exam: yes  (06/02/2010)   Foot exam action/deferral: Do today   High risk foot: No  (04/15/2010)   Foot care education: Done  (04/15/2010)    Urine microalbumin/creatinine ratio: 4.1  (07/01/2009)   Urine microalbumin action/deferral: Deferred    Diabetes flowsheet reviewed?: Yes   Progress toward A1C goal: At goal  Lipids   Total Cholesterol: 182  (03/17/2010)   Lipid panel action/deferral: LDL Direct Ordered   LDL: 063  (03/17/2010)   LDL Direct: Not documented   HDL: 35  (03/17/2010)   Triglycerides: 168  (03/17/2010)    SGOT (AST): 16  (07/01/2009)   SGPT (ALT): 13  (07/01/2009)   Alkaline phosphatase: 70  (07/01/2009)   Total bilirubin: 0.3  (07/01/2009)    Lipid flowsheet reviewed?: Yes   Progress toward LDL goal: Unchanged  Hypertension   Last Blood Pressure: 134 / 90  (08/24/2010)   Serum creatinine: 0.65  (03/17/2010)   BMP action: Deferred   Serum potassium 3.8  (03/17/2010)    Hypertension flowsheet reviewed?: Yes   Progress toward BP goal: At goal  Self-Management Support :   Personal Goals (by the next clinic visit) :     Personal A1C goal: 7  (06/11/2009)     Personal blood pressure goal: 140/90  (06/25/2009)     Personal LDL goal: 100  (  06/11/2009)    Patient will work on the following items until the next clinic visit to reach self-care goals:     Medications and monitoring: take my medicines every day, examine my feet every day  (08/24/2010)     Eating: eat baked foods instead of fried foods, eat fruit for snacks and desserts  (08/24/2010)     Activity: take a 30 minute walk every day  (06/02/2010)    Diabetes self-management support: Written self-care plan  (08/24/2010)   Diabetes care plan printed   Last diabetes self-management training by diabetes educator: 06/11/2009     Hypertension self-management support: Written self-care plan  (08/24/2010)   Hypertension self-care plan printed.    Lipid self-management support: Written self-care plan  (08/24/2010)   Lipid self-care plan printed.   Nursing Instructions: Give tetanus booster today Refer for screening diabetic eye exam (see order)    Process Orders Check Orders Results:     [facility]: Check successful Tests Sent for requisitioning (August 24, 2010 5:56 PM):     08/24/2010: [facility] -- T-CMP with Estimated GFR [[id number]] (signed)    Laboratory Results   Blood Tests   Date/Time Received: August 24, 2010 3:38 PM Date/Time Reported: Alric Quan  August 24, 2010 3:38 PM  HGBA1C: 7.0%   (Normal Range: Non-Diabetic - 3-6%   Control Diabetic - 6-8%) CBG Random:: 103mg /dL

## 2010-10-15 NOTE — Progress Notes (Signed)
Summary: med refill/gp  Phone Note Refill Request Message from:  Fax from Pharmacy on September 21, 2010 2:22 PM  Refills Requested: Medication #1:  PERCOCET 7.5-500 MG TABS take 1 tablet every 8 hours as needed for pain   Dosage confirmed as above?Dosage Confirmed   Brand Name Necessary? No   Supply Requested: 1 month   Last Refilled: 08/24/2010 Last appt. 08/24/10.   Method Requested: Pick up at Office Initial call taken by: Chinita Pester RN,  September 21, 2010 2:22 PM  Follow-up for Phone Call        Rx written and kept in Rx Room. Follow-up by: Lars Mage MD,  September 22, 2010 3:50 PM  Additional Follow-up for Phone Call Additional follow up Details #1::        attempted to call pt, ph disconnected Additional Follow-up by: Marin Roberts RN,  September 23, 2010 11:22 AM    Prescriptions: PERCOCET 7.5-500 MG TABS (OXYCODONE-ACETAMINOPHEN) take 1 tablet every 8 hours as needed for pain  #60 x 0   Entered and Authorized by:   Lars Mage MD   Signed by:   Lars Mage MD on 09/22/2010   Method used:   Handwritten   RxID:   [id number]

## 2010-10-15 NOTE — Letter (Signed)
Summary: Eye Appt//kg  [facility]  [address]   [place], [place] [address]   Phone: [phone]  Fax: [phone]    10/08/2010  Kaylee Murphy [address] [place], [place]  [address]  Dear [name], I was unable to contact you by phone at the number listed on your . Your doctor has requested that you get an annual eye exam.  The following appointment has been made for you:    Referral Appointment Information       Day/Date: March 20th, 2012   Time: 2:30pm   Place/MD: [facility] (Dr Dione Booze)   Address: [address] [place]   Phone: [phone] If you are unable to keep this appointment, seeing another MD for eye care, or have already had your eyes checked within the last 12 months, then please call to cancel/reschedule this appointment.  Please contact me directly if you have any questions about this appointment.  Thank you.  Sincerely,     Cynda Familia (AAMA)

## 2010-10-19 ENCOUNTER — Encounter: Payer: Self-pay | Admitting: Internal Medicine

## 2010-10-20 ENCOUNTER — Other Ambulatory Visit: Payer: Self-pay | Admitting: *Deleted

## 2010-10-20 NOTE — Telephone Encounter (Signed)
Last refill 09/22/2010 Pt would like to pick up today and have Rx postdated.

## 2010-10-21 ENCOUNTER — Encounter: Payer: Self-pay | Admitting: Internal Medicine

## 2010-10-23 ENCOUNTER — Ambulatory Visit (INDEPENDENT_AMBULATORY_CARE_PROVIDER_SITE_OTHER): Payer: Medicare Other | Admitting: Internal Medicine

## 2010-10-23 ENCOUNTER — Other Ambulatory Visit: Payer: Self-pay | Admitting: Internal Medicine

## 2010-10-23 ENCOUNTER — Encounter: Payer: Self-pay | Admitting: Internal Medicine

## 2010-10-23 DIAGNOSIS — I10 Essential (primary) hypertension: Secondary | ICD-10-CM

## 2010-10-23 DIAGNOSIS — E119 Type 2 diabetes mellitus without complications: Secondary | ICD-10-CM

## 2010-10-23 DIAGNOSIS — N390 Urinary tract infection, site not specified: Secondary | ICD-10-CM

## 2010-10-23 DIAGNOSIS — E785 Hyperlipidemia, unspecified: Secondary | ICD-10-CM

## 2010-10-23 MED ORDER — SULFAMETHOXAZOLE-TRIMETHOPRIM 800-160 MG PO TABS: 1.0000 | ORAL_TABLET | Freq: Two times a day (BID) | ORAL | Status: AC

## 2010-10-23 NOTE — Assessment & Plan Note (Signed)
At goal, will continue the same medication regimen. Pt was advised to continue to monitor her BP regularly and to call us if her numbers are higher > 150/100.

## 2010-10-23 NOTE — Assessment & Plan Note (Signed)
Symptoms consistent with UTI, will check UA and urine culture and will treat with bactrim for 3 days. Pt was advised to maintain proper hygiene and to call us if her symptoms get worse or do not get better in 1-2 weeks.

## 2010-10-23 NOTE — Assessment & Plan Note (Signed)
Pt refused to get FLP checked today but has agreed to have it checked on her next appointment. For now no changes in the regimen will be made. Pt will also need LFT checked to ensure that they are within normal limits.

## 2010-10-23 NOTE — Progress Notes (Signed)
  Subjective:    Patient ID: Kaylee Murphy, female    DOB: 02-Dec-1960, 50 y.o.   MRN: [id number]  HPI Pt is 50 yo female with past medical history outlined below who presents to [facility] with main concern of urinary frequency , urgency, hematuria, and dysuria that started approximately 1 week ago and is not getting significantly better. She has had similar episode in the past, approximately 1 year ago and was told she has UTI. She was treated with antibiotics and has recovered. She denies any fevers, chills, no additional abdominal concerns, no blood in stool. She denies any additional systemic concerns, reports compliance with her blood pressure and diabetic medications as well as with recommended diet. In terms of her shoulder pain secondary to rotator cuff tear, she continues to take pain medication which helps with pain control.   Review of Systems  Constitutional: Negative.   HENT: Negative.   Respiratory: Negative.   Cardiovascular: Negative.   Gastrointestinal: Negative.   Genitourinary: Positive for dysuria, frequency and hematuria. Negative for decreased urine volume, vaginal bleeding, vaginal discharge and vaginal pain.       Objective:   Physical Exam  Constitutional: She appears well-developed and well-nourished. No distress.  HENT:  Head: Normocephalic and atraumatic.  Right Ear: External ear normal.  Left Ear: External ear normal.  Nose: Nose normal.  Mouth/Throat: Oropharynx is clear and moist. No oropharyngeal exudate.  Cardiovascular: Normal rate, regular rhythm, normal heart sounds and intact distal pulses.  Exam reveals no gallop and no friction rub.   No murmur heard. Pulmonary/Chest: Effort normal and breath sounds normal. No respiratory distress. She has no wheezes. She has no rales. She exhibits no tenderness.  Abdominal: Soft. Bowel sounds are normal. She exhibits no distension and no mass. There is no tenderness. There is no rebound and no guarding.    Musculoskeletal: Normal range of motion. She exhibits no edema.  Skin: She is not diaphoretic.          Assessment & Plan:

## 2010-10-23 NOTE — Patient Instructions (Signed)
Please schedule follow up appointment in 3 months

## 2010-10-23 NOTE — Assessment & Plan Note (Signed)
Good control, will continue the same medication regimen. Her A1C is due next month.

## 2010-10-24 LAB — URINALYSIS
Leukocytes, UA: NEGATIVE
Protein, ur: NEGATIVE mg/dL
Specific Gravity, Urine: 1.027 (ref 1.005–1.030)
Urobilinogen, UA: 1 mg/dL (ref 0.0–1.0)
pH: 6 (ref 5.0–8.0)

## 2010-10-25 LAB — URINE CULTURE: Colony Count: NO GROWTH

## 2010-10-26 MED ORDER — OXYCODONE-ACETAMINOPHEN 7.5-500 MG PO TABS: 1.0000 | ORAL_TABLET | Freq: Three times a day (TID) | ORAL | Status: DC | PRN

## 2010-10-26 MED ORDER — LISINOPRIL 10 MG PO TABS: 10.0000 mg | ORAL_TABLET | Freq: Every day | ORAL | Status: DC

## 2010-10-26 NOTE — Telephone Encounter (Signed)
Rx ready, pt informed 

## 2010-10-26 NOTE — Telephone Encounter (Signed)
Rx kept in the Rx room.

## 2010-11-24 LAB — GLUCOSE, CAPILLARY: Glucose-Capillary: 103 mg/dL — ABNORMAL HIGH (ref 70–99)

## 2010-11-26 ENCOUNTER — Other Ambulatory Visit: Payer: Self-pay | Admitting: Internal Medicine

## 2010-11-26 ENCOUNTER — Encounter: Payer: Self-pay | Admitting: Physician Assistant

## 2010-11-26 LAB — URINALYSIS, ROUTINE W REFLEX MICROSCOPIC
Glucose, UA: NEGATIVE mg/dL
Protein, ur: 30 mg/dL — AB
Urobilinogen, UA: 1 mg/dL (ref 0.0–1.0)

## 2010-11-26 LAB — CBC
MCHC: 32.8 g/dL (ref 30.0–36.0)
MCV: 84 fL (ref 78.0–100.0)
Platelets: 367 10*3/uL (ref 150–400)
RBC: 3.86 MIL/uL — ABNORMAL LOW (ref 3.87–5.11)
RDW: 15.9 % — ABNORMAL HIGH (ref 11.5–15.5)

## 2010-11-26 LAB — COMPREHENSIVE METABOLIC PANEL
AST: 17 U/L (ref 0–37)
Albumin: 3.7 g/dL (ref 3.5–5.2)
Alkaline Phosphatase: 75 U/L (ref 39–117)
BUN: 13 mg/dL (ref 6–23)
Chloride: 107 mEq/L (ref 96–112)
Creatinine, Ser: 0.78 mg/dL (ref 0.4–1.2)
GFR calc Af Amer: >60 mL/min (ref >60)
Potassium: 3.6 mEq/L (ref 3.5–5.1)
Total Protein: 7.7 g/dL (ref 6.0–8.3)

## 2010-11-26 LAB — GLUCOSE, CAPILLARY: Glucose-Capillary: 219 mg/dL — ABNORMAL HIGH (ref 70–99)

## 2010-11-26 LAB — DIFFERENTIAL
Eosinophils Relative: 2 % (ref 0–5)
Lymphocytes Relative: 28 % (ref 12–46)
Monocytes Absolute: 0.8 10*3/uL (ref 0.1–1.0)
Monocytes Relative: 5 % (ref 3–12)
Neutro Abs: 9 10*3/uL — ABNORMAL HIGH (ref 1.7–7.7)

## 2010-11-26 LAB — URINE MICROSCOPIC-ADD ON

## 2010-11-27 LAB — GLUCOSE, CAPILLARY: Glucose-Capillary: 117 mg/dL — ABNORMAL HIGH (ref 70–99)

## 2010-11-30 ENCOUNTER — Other Ambulatory Visit: Payer: Self-pay | Admitting: *Deleted

## 2010-11-30 LAB — GLUCOSE, CAPILLARY: Glucose-Capillary: 200 mg/dL — ABNORMAL HIGH (ref 70–99)

## 2010-12-02 ENCOUNTER — Other Ambulatory Visit (INDEPENDENT_AMBULATORY_CARE_PROVIDER_SITE_OTHER): Payer: Medicare Other | Admitting: *Deleted

## 2010-12-02 DIAGNOSIS — Z9889 Other specified postprocedural states: Secondary | ICD-10-CM

## 2010-12-02 MED ORDER — OXYCODONE-ACETAMINOPHEN 7.5-500 MG PO TABS: 1.0000 | ORAL_TABLET | Freq: Three times a day (TID) | ORAL | Status: DC | PRN

## 2010-12-02 NOTE — Telephone Encounter (Signed)
Pt called requesting refill.  Said that this was the dosing of the medication.

## 2010-12-04 MED ORDER — TRAMADOL HCL 50 MG PO TABS: ORAL_TABLET | ORAL | Status: DC

## 2010-12-07 LAB — GLUCOSE, CAPILLARY: Glucose-Capillary: 96 mg/dL (ref 70–99)

## 2010-12-08 ENCOUNTER — Emergency Department (HOSPITAL_COMMUNITY)
Admission: EM | Admit: 2010-12-08 | Discharge: 2010-12-09 | Disposition: A | Discharge status: Home / Self Care | Payer: Medicare Other | Attending: Emergency Medicine | Admitting: Emergency Medicine

## 2010-12-08 ENCOUNTER — Emergency Department (HOSPITAL_COMMUNITY): Payer: Medicare Other

## 2010-12-08 DIAGNOSIS — F3289 Other specified depressive episodes: Secondary | ICD-10-CM | POA: Insufficient documentation

## 2010-12-08 DIAGNOSIS — E119 Type 2 diabetes mellitus without complications: Secondary | ICD-10-CM | POA: Insufficient documentation

## 2010-12-08 DIAGNOSIS — M79609 Pain in unspecified limb: Secondary | ICD-10-CM | POA: Insufficient documentation

## 2010-12-08 DIAGNOSIS — F329 Major depressive disorder, single episode, unspecified: Secondary | ICD-10-CM | POA: Insufficient documentation

## 2010-12-08 DIAGNOSIS — M549 Dorsalgia, unspecified: Secondary | ICD-10-CM | POA: Insufficient documentation

## 2010-12-08 DIAGNOSIS — Z79899 Other long term (current) drug therapy: Secondary | ICD-10-CM | POA: Insufficient documentation

## 2010-12-08 DIAGNOSIS — R110 Nausea: Secondary | ICD-10-CM | POA: Insufficient documentation

## 2010-12-08 DIAGNOSIS — G8929 Other chronic pain: Secondary | ICD-10-CM | POA: Insufficient documentation

## 2010-12-08 LAB — URINALYSIS, ROUTINE W REFLEX MICROSCOPIC
Bilirubin Urine: NEGATIVE
Glucose, UA: NEGATIVE mg/dL
Hgb urine dipstick: NEGATIVE
Ketones, ur: NEGATIVE mg/dL
Protein, ur: NEGATIVE mg/dL

## 2010-12-08 LAB — BASIC METABOLIC PANEL
Calcium: 9 mg/dL (ref 8.4–10.5)
Chloride: 105 mEq/L (ref 96–112)
Creatinine, Ser: 0.69 mg/dL (ref 0.4–1.2)
GFR calc Af Amer: >60 mL/min (ref >60)

## 2010-12-08 LAB — DIFFERENTIAL
Basophils Relative: 1 % (ref 0–1)
Lymphocytes Relative: 36 % (ref 12–46)
Monocytes Relative: 6 % (ref 3–12)
Neutro Abs: 7.4 10*3/uL (ref 1.7–7.7)
Neutrophils Relative %: 54 % (ref 43–77)

## 2010-12-08 LAB — POCT CARDIAC MARKERS
Myoglobin, poc: 55.5 ng/mL (ref 12–200)
Myoglobin, poc: 70.8 ng/mL (ref 12–200)
Troponin i, poc: <0.05 ng/mL (ref 0.00–0.09)

## 2010-12-08 LAB — POCT I-STAT, CHEM 8
BUN: 18 mg/dL (ref 6–23)
Calcium, Ion: 1.33 mmol/L — ABNORMAL HIGH (ref 1.12–1.32)
Creatinine, Ser: 1.1 mg/dL (ref 0.4–1.2)
Glucose, Bld: 142 mg/dL — ABNORMAL HIGH (ref 70–99)
Sodium: 139 mEq/L (ref 135–145)
TCO2: 27 mmol/L (ref 0–100)

## 2010-12-08 LAB — CBC
Hemoglobin: 10.5 g/dL — ABNORMAL LOW (ref 12.0–15.0)
MCH: 26.2 pg (ref 26.0–34.0)
RBC: 4.01 MIL/uL (ref 3.87–5.11)

## 2010-12-09 ENCOUNTER — Encounter: Payer: Self-pay | Admitting: Internal Medicine

## 2010-12-09 NOTE — Progress Notes (Unsigned)
Patient was seen in the ED for atypical chest pain and arm pain

## 2010-12-09 NOTE — Progress Notes (Signed)
Patient was evaluated in the ED by ED physician for left arm pain which was atypical for acs but concern for angianl equivalent given her risk factor. ekg and CE unrmarkable. Patient refused admission so discharged from the ED. Will try to schedule her in the opc in 1 week and possible outpatient cardiology follow up for stress test

## 2010-12-15 ENCOUNTER — Ambulatory Visit: Payer: Medicare Other | Admitting: Internal Medicine

## 2010-12-16 LAB — GLUCOSE, CAPILLARY: Glucose-Capillary: 77 mg/dL (ref 70–99)

## 2010-12-18 LAB — FERRITIN: Ferritin: 23 ng/mL (ref 10–291)

## 2010-12-18 LAB — CBC
HCT: 30.9 % — ABNORMAL LOW (ref 36.0–46.0)
HCT: 33.3 % — ABNORMAL LOW (ref 36.0–46.0)
MCHC: 32.4 g/dL (ref 30.0–36.0)
MCV: 82.7 fL (ref 78.0–100.0)
MCV: 82.8 fL (ref 78.0–100.0)
Platelets: 275 10*3/uL (ref 150–400)
RBC: 3.74 MIL/uL — ABNORMAL LOW (ref 3.87–5.11)
RDW: 17.3 % — ABNORMAL HIGH (ref 11.5–15.5)

## 2010-12-18 LAB — POCT CARDIAC MARKERS: Troponin i, poc: <0.05 ng/mL (ref 0.00–0.09)

## 2010-12-18 LAB — POCT PREGNANCY, URINE: Preg Test, Ur: NEGATIVE

## 2010-12-18 LAB — GC/CHLAMYDIA PROBE AMP, GENITAL
Chlamydia, DNA Probe: NEGATIVE
GC Probe Amp, Genital: NEGATIVE

## 2010-12-18 LAB — IRON AND TIBC
Iron: 52 ug/dL (ref 42–135)
Saturation Ratios: 14 % — ABNORMAL LOW (ref 20–55)
UIBC: 310 ug/dL

## 2010-12-18 LAB — CARDIAC PANEL(CRET KIN+CKTOT+MB+TROPI): Relative Index: INVALID (ref 0.0–2.5)

## 2010-12-18 LAB — COMPREHENSIVE METABOLIC PANEL
Albumin: 3.5 g/dL (ref 3.5–5.2)
BUN: 12 mg/dL (ref 6–23)
Chloride: 109 mEq/L (ref 96–112)
Creatinine, Ser: 0.74 mg/dL (ref 0.4–1.2)
Glucose, Bld: 134 mg/dL — ABNORMAL HIGH (ref 70–99)
Total Bilirubin: 0.2 mg/dL — ABNORMAL LOW (ref 0.3–1.2)

## 2010-12-18 LAB — WET PREP, GENITAL
Trich, Wet Prep: NONE SEEN
WBC, Wet Prep HPF POC: NONE SEEN

## 2010-12-18 LAB — URINALYSIS, ROUTINE W REFLEX MICROSCOPIC
Ketones, ur: 15 mg/dL — AB
Leukocytes, UA: NEGATIVE
Nitrite: NEGATIVE
Nitrite: NEGATIVE
Protein, ur: 30 mg/dL — AB
Protein, ur: NEGATIVE mg/dL
Urobilinogen, UA: 0.2 mg/dL (ref 0.0–1.0)
pH: 6.5 (ref 5.0–8.0)

## 2010-12-18 LAB — URINE CULTURE

## 2010-12-18 LAB — DIFFERENTIAL
Eosinophils Relative: 2 % (ref 0–5)
Lymphocytes Relative: 31 % (ref 12–46)
Lymphs Abs: 4.1 10*3/uL — ABNORMAL HIGH (ref 0.7–4.0)

## 2010-12-18 LAB — LIPID PANEL
LDL Cholesterol: 36 mg/dL (ref 0–99)
Total CHOL/HDL Ratio: 3.4 RATIO
Triglycerides: 321 mg/dL — ABNORMAL HIGH (ref <150)
VLDL: 64 mg/dL — ABNORMAL HIGH (ref 0–40)

## 2010-12-18 LAB — CK TOTAL AND CKMB (NOT AT ARMC)
CK, MB: 4.5 ng/mL — ABNORMAL HIGH (ref 0.3–4.0)
Total CK: 82 U/L (ref 7–177)

## 2010-12-18 LAB — D-DIMER, QUANTITATIVE: D-Dimer, Quant: 0.42 ug/mL-FEU (ref 0.00–0.48)

## 2010-12-18 LAB — BASIC METABOLIC PANEL
BUN: 12 mg/dL (ref 6–23)
CO2: 23 mEq/L (ref 19–32)
Chloride: 110 mEq/L (ref 96–112)
Creatinine, Ser: 0.66 mg/dL (ref 0.4–1.2)
GFR calc Af Amer: >60 mL/min (ref >60)

## 2010-12-18 LAB — RETICULOCYTES: Retic Ct Pct: 0.7 % (ref 0.4–3.1)

## 2010-12-18 LAB — HEMOGLOBIN A1C
Hgb A1c MFr Bld: 6.6 % — ABNORMAL HIGH (ref 4.6–6.1)
Mean Plasma Glucose: 143 mg/dL

## 2010-12-18 LAB — URINE MICROSCOPIC-ADD ON

## 2010-12-21 ENCOUNTER — Emergency Department (HOSPITAL_COMMUNITY)
Admission: EM | Admit: 2010-12-21 | Discharge: 2010-12-22 | Disposition: A | Discharge status: Home / Self Care | Payer: Medicare Other | Attending: Emergency Medicine | Admitting: Emergency Medicine

## 2010-12-21 ENCOUNTER — Emergency Department (HOSPITAL_COMMUNITY): Payer: Medicare Other

## 2010-12-21 DIAGNOSIS — R350 Frequency of micturition: Secondary | ICD-10-CM | POA: Insufficient documentation

## 2010-12-21 DIAGNOSIS — E119 Type 2 diabetes mellitus without complications: Secondary | ICD-10-CM | POA: Insufficient documentation

## 2010-12-21 DIAGNOSIS — R079 Chest pain, unspecified: Secondary | ICD-10-CM | POA: Insufficient documentation

## 2010-12-21 DIAGNOSIS — R109 Unspecified abdominal pain: Secondary | ICD-10-CM | POA: Insufficient documentation

## 2010-12-21 DIAGNOSIS — R112 Nausea with vomiting, unspecified: Secondary | ICD-10-CM | POA: Insufficient documentation

## 2010-12-21 DIAGNOSIS — M79609 Pain in unspecified limb: Secondary | ICD-10-CM | POA: Insufficient documentation

## 2010-12-21 LAB — DIFFERENTIAL
Basophils Absolute: 0.1 10*3/uL (ref 0.0–0.1)
Basophils Relative: 1 % (ref 0–1)
Eosinophils Absolute: 0.5 10*3/uL (ref 0.0–0.7)
Eosinophils Relative: 4 % (ref 0–5)
Lymphocytes Relative: 31 % (ref 12–46)
Lymphs Abs: 4.5 10*3/uL — ABNORMAL HIGH (ref 0.7–4.0)
Monocytes Absolute: 0.8 10*3/uL (ref 0.1–1.0)
Monocytes Relative: 6 % (ref 3–12)
Neutro Abs: 8.6 10*3/uL — ABNORMAL HIGH (ref 1.7–7.7)
Neutrophils Relative %: 59 % (ref 43–77)

## 2010-12-21 LAB — POCT CARDIAC MARKERS
CKMB, poc: 1.2 ng/mL (ref 1.0–8.0)
Myoglobin, poc: 26.2 ng/mL (ref 12–200)
Myoglobin, poc: 27 ng/mL (ref 12–200)
Troponin i, poc: <0.05 ng/mL (ref 0.00–0.09)
Troponin i, poc: <0.05 ng/mL (ref 0.00–0.09)

## 2010-12-21 LAB — BASIC METABOLIC PANEL
BUN: 11 mg/dL (ref 6–23)
Chloride: 107 mEq/L (ref 96–112)
Creatinine, Ser: 0.84 mg/dL (ref 0.4–1.2)
GFR calc Af Amer: >60 mL/min (ref >60)
GFR calc non Af Amer: >60 mL/min (ref >60)
Potassium: 3.8 mEq/L (ref 3.5–5.1)

## 2010-12-21 LAB — CBC
HCT: 32.1 % — ABNORMAL LOW (ref 36.0–46.0)
Hemoglobin: 10 g/dL — ABNORMAL LOW (ref 12.0–15.0)
MCH: 25.4 pg — ABNORMAL LOW (ref 26.0–34.0)
MCHC: 31.2 g/dL (ref 30.0–36.0)
MCV: 81.5 fL (ref 78.0–100.0)
Platelets: 402 10*3/uL — ABNORMAL HIGH (ref 150–400)
RBC: 3.94 MIL/uL (ref 3.87–5.11)
RDW: 15 % (ref 11.5–15.5)
WBC: 14.5 10*3/uL — ABNORMAL HIGH (ref 4.0–10.5)

## 2010-12-21 LAB — URINALYSIS, ROUTINE W REFLEX MICROSCOPIC
Bilirubin Urine: NEGATIVE
Glucose, UA: NEGATIVE mg/dL
Hgb urine dipstick: NEGATIVE
Ketones, ur: NEGATIVE mg/dL
Nitrite: NEGATIVE
Protein, ur: NEGATIVE mg/dL
Specific Gravity, Urine: 1.014 (ref 1.005–1.030)
Urobilinogen, UA: 0.2 mg/dL (ref 0.0–1.0)
pH: 6.5 (ref 5.0–8.0)

## 2010-12-28 ENCOUNTER — Other Ambulatory Visit (INDEPENDENT_AMBULATORY_CARE_PROVIDER_SITE_OTHER): Payer: Medicare Other | Admitting: *Deleted

## 2010-12-28 DIAGNOSIS — E785 Hyperlipidemia, unspecified: Secondary | ICD-10-CM

## 2010-12-29 MED ORDER — OXYCODONE-ACETAMINOPHEN 7.5-500 MG PO TABS: 1.0000 | ORAL_TABLET | Freq: Three times a day (TID) | ORAL | Status: DC | PRN

## 2010-12-29 MED ORDER — TRAMADOL HCL 50 MG PO TABS: ORAL_TABLET | ORAL | Status: DC

## 2011-01-17 ENCOUNTER — Inpatient Hospital Stay (INDEPENDENT_AMBULATORY_CARE_PROVIDER_SITE_OTHER)
Admission: RE | Admit: 2011-01-17 | Discharge: 2011-01-17 | Disposition: A | Discharge status: Home / Self Care | Payer: Medicare Other | Source: Ambulatory Visit | Attending: Family Medicine | Admitting: Family Medicine

## 2011-01-17 ENCOUNTER — Emergency Department (HOSPITAL_COMMUNITY)
Admission: EM | Admit: 2011-01-17 | Discharge: 2011-01-17 | Disposition: A | Discharge status: Home / Self Care | Payer: Medicare Other

## 2011-01-17 DIAGNOSIS — R109 Unspecified abdominal pain: Secondary | ICD-10-CM

## 2011-01-17 LAB — POCT URINALYSIS DIP (DEVICE)
Glucose, UA: 100 mg/dL — AB
Hgb urine dipstick: NEGATIVE
Nitrite: NEGATIVE
Protein, ur: NEGATIVE mg/dL
Specific Gravity, Urine: >=1.03 (ref 1.005–1.030)
Urobilinogen, UA: 0.2 mg/dL (ref 0.0–1.0)

## 2011-02-11 ENCOUNTER — Other Ambulatory Visit: Payer: Self-pay | Admitting: Internal Medicine

## 2011-02-22 ENCOUNTER — Encounter: Payer: Self-pay | Admitting: Internal Medicine

## 2011-03-13 ENCOUNTER — Other Ambulatory Visit: Payer: Self-pay | Admitting: Internal Medicine

## 2011-03-15 ENCOUNTER — Other Ambulatory Visit: Payer: Self-pay | Admitting: Internal Medicine

## 2011-04-06 ENCOUNTER — Inpatient Hospital Stay (INDEPENDENT_AMBULATORY_CARE_PROVIDER_SITE_OTHER)
Admission: RE | Admit: 2011-04-06 | Discharge: 2011-04-06 | Disposition: A | Discharge status: Home / Self Care | Payer: Medicare Other | Source: Ambulatory Visit | Attending: Family Medicine | Admitting: Family Medicine

## 2011-04-06 DIAGNOSIS — IMO0002 Reserved for concepts with insufficient information to code with codable children: Secondary | ICD-10-CM

## 2011-04-21 ENCOUNTER — Other Ambulatory Visit: Payer: Self-pay | Admitting: Internal Medicine

## 2011-05-07 ENCOUNTER — Inpatient Hospital Stay (INDEPENDENT_AMBULATORY_CARE_PROVIDER_SITE_OTHER)
Admission: RE | Admit: 2011-05-07 | Discharge: 2011-05-07 | Disposition: A | Discharge status: Home / Self Care | Payer: Medicare Other | Source: Ambulatory Visit | Attending: Family Medicine | Admitting: Family Medicine

## 2011-05-07 ENCOUNTER — Other Ambulatory Visit: Payer: Self-pay | Admitting: Internal Medicine

## 2011-05-07 DIAGNOSIS — M25519 Pain in unspecified shoulder: Secondary | ICD-10-CM

## 2011-06-21 ENCOUNTER — Other Ambulatory Visit: Payer: Self-pay | Admitting: Internal Medicine

## 2011-06-29 ENCOUNTER — Other Ambulatory Visit: Payer: Self-pay | Admitting: Internal Medicine

## 2011-06-29 DIAGNOSIS — Z1231 Encounter for screening mammogram for malignant neoplasm of breast: Secondary | ICD-10-CM

## 2011-07-16 ENCOUNTER — Ambulatory Visit: Payer: Medicare Other

## 2011-07-20 ENCOUNTER — Other Ambulatory Visit: Payer: Self-pay | Admitting: Internal Medicine

## 2011-08-09 ENCOUNTER — Encounter (HOSPITAL_COMMUNITY): Payer: Self-pay

## 2011-08-09 ENCOUNTER — Emergency Department (INDEPENDENT_AMBULATORY_CARE_PROVIDER_SITE_OTHER)
Admission: EM | Admit: 2011-08-09 | Discharge: 2011-08-09 | Disposition: A | Discharge status: Home / Self Care | Payer: Medicare Other | Source: Home / Self Care

## 2011-08-09 DIAGNOSIS — M25519 Pain in unspecified shoulder: Secondary | ICD-10-CM

## 2011-08-09 DIAGNOSIS — G8929 Other chronic pain: Secondary | ICD-10-CM

## 2011-08-09 LAB — POCT URINALYSIS DIP (DEVICE)
Glucose, UA: NEGATIVE mg/dL
Hgb urine dipstick: NEGATIVE
Nitrite: NEGATIVE
Protein, ur: NEGATIVE mg/dL
Specific Gravity, Urine: 1.015 (ref 1.005–1.030)
Urobilinogen, UA: 0.2 mg/dL (ref 0.0–1.0)

## 2011-08-09 NOTE — ED Notes (Signed)
left shoulder pain with neck pain as well. hurting for 1 week.  appointment with pain clinic on dec 3rd. for pain. 2 weeks ago was treated for urinary tract infection and rx antibiotics.  after taking all of medications, uti symptoms returned.

## 2011-08-09 NOTE — ED Notes (Signed)
Pt. Left against medical advice.  When asked to sign the computer, she refused.

## 2011-08-09 NOTE — ED Provider Notes (Signed)
History     CSN: [id number] Arrival date & time: 08/09/2011  7:41 PM   First MD Initiated Contact with Patient 08/09/11 1820      Chief Complaint  Patient presents with  . Cystitis    2 weeks ago was treated for urinary tract infection and rx antibiotics.  after taking all of medications, uti symptoms returned.  . Shoulder Pain    left shoulder pain with neck pain as well. hurting for 1 week.  appointment with pain clinic on dec 3rd. for pain.     (Consider location/radiation/quality/duration/timing/severity/associated sxs/prior treatment) HPI Comments: H/o chronic shoulder pain with rotator cuff repair in each side states she has been referred to the pain management clinic and has appt on Dec.3. "Wants to have a dilaudid shot that helped for 1 week last time" She is currently taking percocet tid at home as per her report, also took one over the counter Advil today. States Toradol makes her vomit. Declines a refill for ibuprofen prescription strength. After examination Patient got upset as I explained I do not feel comfortable ordering parenteral narcotics for her tonight and she pass by me and walked out of the room.   Past Medical History  Diagnosis Date  . Diabetes mellitus   . Hypertension   . Hyperlipidemia   . Chronic shoulder pain 10 years    s/p multiple b/l rotator cuff repair sx - per MRI 04/2010 -  Delamination of the infraspinatus with mild fatty atrophysuggest distal tendon tear with medial propagation. Depending onthe hardware, CT arthrography may add information for evaluation othe rotator cuff.f   . Depression     Past Surgical History  Procedure Date  . Rotator cuff repair     Repair 2004, 2005 and 2007  . Rotator cuff repair 2004  . Rotator cuff repair     x3    Family History  Problem Relation Age of Onset  . Cancer Mother   . Stroke Father     History  Substance Use Topics  . Smoking status: Current Everyday Smoker -- 0.2 packs/day    Types:  Cigarettes  . Smokeless tobacco: Not on file  . Alcohol Use: No    OB History    Grav Para Term Preterm Abortions TAB SAB Ect Mult Living                  Review of Systems  Constitutional: Negative.   Gastrointestinal: Negative for nausea, vomiting, diarrhea and abdominal distention.       Reports low abdominal pain and frequency.  Genitourinary: Positive for frequency. Negative for dysuria, urgency, hematuria, flank pain, vaginal bleeding and vaginal discharge.  Musculoskeletal: Negative for joint swelling.       Bilateral shoulder pain more left than right.  Skin: Negative for rash.    Allergies  Cephalexin; Aspirin; and Codeine  Home Medications   Current Outpatient Rx  Name Route Sig Dispense Refill  . GLUCOSE BLOOD VI STRP Other 1 each by Other route 2 (two) times daily. Use as instructed    . LANCETS MISC Does not apply by Does not apply route. Use to check blood sugars 2 hours after meals.     Marland Kitchen METFORMIN HCL 500 MG PO TABS Oral Take 500 mg by mouth daily with breakfast.     . IBUPROFEN 200 MG PO TABS Oral Take 400 mg by mouth every 6 (six) hours as needed. For pain     . LISINOPRIL 10 MG PO TABS Oral  Take 10 mg by mouth daily.      . OXYCODONE-ACETAMINOPHEN 7.5-500 MG PO TABS Oral Take 1 tablet by mouth 2 (two) times daily as needed. For pain       BP 144/90  Pulse 77  Temp(Src) 99 F (37.2 C) (Oral)  Resp 16  SpO2 100%  LMP 07/17/2011  Physical Exam  Nursing note and vitals reviewed. Constitutional: She appears well-developed and well-nourished. No distress.  HENT:  Mouth/Throat: No oropharyngeal exudate.  Eyes: Conjunctivae and EOM are normal. Pupils are equal, round, and reactive to light.       Miotic reactive pupils.  Neck: Normal range of motion. Neck supple.  Cardiovascular: Normal heart sounds.   Pulmonary/Chest: Breath sounds normal.  Abdominal: Soft. Bowel sounds are normal. She exhibits no distension and no mass. There is no tenderness. There  is no rebound and no guarding.       No CVT  Musculoskeletal:       Shoulders: Limited active bilateral abduction due to pain.  Left shoulder: No erythema, swelling or deformity. Positive empty can maneuver. Limited range of motion with abduction less than 90 degree. Normal arm and forearm strength, left arm neurovascularly intact.  Skin: No rash noted.    ED Course  Procedures (including critical care time)   Labs Reviewed  POCT URINALYSIS DIP (DEVICE)  LAB REPORT - SCANNED   No results found.   1. Chronic shoulder pain       MDM  Records reviewed. No signs of urinary infection on exam or POC urine test.  Pt. With chronic pain issues managed with narcotics by PCP. Taking percocet tid at home requesting dilaudid injection, declines other forms of pain relievers concerned for narcotic seeking behavior. Pt left the room during our encounter after declining non narcotic injection or prescriptions.        Sharin Grave, MD 08/11/11 [phone]

## 2011-08-10 ENCOUNTER — Encounter (HOSPITAL_COMMUNITY): Payer: Self-pay | Admitting: *Deleted

## 2011-08-10 ENCOUNTER — Emergency Department (HOSPITAL_COMMUNITY)
Admission: EM | Admit: 2011-08-10 | Discharge: 2011-08-10 | Discharge status: Against Medical Advice | Payer: Medicare Other | Attending: Emergency Medicine | Admitting: Emergency Medicine

## 2011-08-10 DIAGNOSIS — Z0389 Encounter for observation for other suspected diseases and conditions ruled out: Secondary | ICD-10-CM | POA: Insufficient documentation

## 2011-08-10 NOTE — ED Notes (Signed)
The pt has a rotator cuff tear in her lt shoulder and for the past 7 days she has had more severe pain.  She is also c/o pain in the back of her neck and lt abd pain.  [facility]

## 2011-08-11 ENCOUNTER — Encounter (HOSPITAL_COMMUNITY): Payer: Self-pay | Admitting: *Deleted

## 2011-08-11 ENCOUNTER — Emergency Department (HOSPITAL_COMMUNITY)
Admission: EM | Admit: 2011-08-11 | Discharge: 2011-08-11 | Disposition: A | Discharge status: Home / Self Care | Payer: Medicare Other | Attending: Emergency Medicine | Admitting: Emergency Medicine

## 2011-08-11 DIAGNOSIS — R300 Dysuria: Secondary | ICD-10-CM | POA: Insufficient documentation

## 2011-08-11 DIAGNOSIS — M549 Dorsalgia, unspecified: Secondary | ICD-10-CM | POA: Insufficient documentation

## 2011-08-11 DIAGNOSIS — R109 Unspecified abdominal pain: Secondary | ICD-10-CM | POA: Insufficient documentation

## 2011-08-11 DIAGNOSIS — F3289 Other specified depressive episodes: Secondary | ICD-10-CM | POA: Insufficient documentation

## 2011-08-11 DIAGNOSIS — E119 Type 2 diabetes mellitus without complications: Secondary | ICD-10-CM | POA: Insufficient documentation

## 2011-08-11 DIAGNOSIS — Z79899 Other long term (current) drug therapy: Secondary | ICD-10-CM | POA: Insufficient documentation

## 2011-08-11 DIAGNOSIS — M542 Cervicalgia: Secondary | ICD-10-CM | POA: Insufficient documentation

## 2011-08-11 DIAGNOSIS — I10 Essential (primary) hypertension: Secondary | ICD-10-CM | POA: Insufficient documentation

## 2011-08-11 DIAGNOSIS — G8929 Other chronic pain: Secondary | ICD-10-CM | POA: Insufficient documentation

## 2011-08-11 DIAGNOSIS — M25519 Pain in unspecified shoulder: Secondary | ICD-10-CM

## 2011-08-11 DIAGNOSIS — E785 Hyperlipidemia, unspecified: Secondary | ICD-10-CM | POA: Insufficient documentation

## 2011-08-11 DIAGNOSIS — F329 Major depressive disorder, single episode, unspecified: Secondary | ICD-10-CM | POA: Insufficient documentation

## 2011-08-11 LAB — URINALYSIS, ROUTINE W REFLEX MICROSCOPIC
Glucose, UA: NEGATIVE mg/dL
Hgb urine dipstick: NEGATIVE
Ketones, ur: 15 mg/dL — AB
Protein, ur: NEGATIVE mg/dL
Urobilinogen, UA: 1 mg/dL (ref 0.0–1.0)

## 2011-08-11 MED ORDER — OXYCODONE-ACETAMINOPHEN 5-325 MG PO TABS: 1.0000 | ORAL_TABLET | Freq: Four times a day (QID) | ORAL | Status: DC | PRN

## 2011-08-11 MED ORDER — ONDANSETRON 4 MG PO TBDP
8.0000 mg | ORAL_TABLET | ORAL | Status: AC
  Administered 2011-08-11: 8 mg via ORAL
  Filled 2011-08-11: qty 2

## 2011-08-11 MED ORDER — KETOROLAC TROMETHAMINE 60 MG/2ML IM SOLN
60.0000 mg | Freq: Once | INTRAMUSCULAR | Status: AC
  Administered 2011-08-11: 60 mg via INTRAMUSCULAR
  Filled 2011-08-11: qty 2

## 2011-08-11 NOTE — ED Provider Notes (Signed)
History     CSN: [id number] Arrival date & time: 08/11/2011  3:42 PM   First MD Initiated Contact with Patient 08/11/11 1822      Chief Complaint  Patient presents with  . Neck Pain    left side pain, left shoulder pain and HA    (Consider location/radiation/quality/duration/timing/severity/associated sxs/prior treatment) HPI Patient presents with complaint of left lower flank pain as well as chronic shoulder pain as well as left-sided neck pain. She states all of these symptoms have been ongoing for the past several weeks. She states she has a known rotator cuff injury of her left shoulder and has had surgery on this in the past and does not want any further surgery. She states her orthopedic doctor has referred her to pain management and she has an appointment on December 3. She denies any new injury or trauma to her neck shoulder or back. She states she was recently treated for a urinary tract infection which she finished antibiotics for approximately one and a half weeks ago. She states the pain in her left flank feels similar to her prior urinary tract infection. She does describe burning with urination. She's had no urgency or frequency. She's had no fevers. Movement and palpation make her pain worse. She states that she takes Percocet and ibuprofen for pain which have not helped with her pain. There are no associated systemic symptoms.  Past Medical History  Diagnosis Date  . Diabetes mellitus   . Hypertension   . Hyperlipidemia   . Chronic shoulder pain 10 years    s/p multiple b/l rotator cuff repair sx - per MRI 04/2010 -  Delamination of the infraspinatus with mild fatty atrophysuggest distal tendon tear with medial propagation. Depending onthe hardware, CT arthrography may add information for evaluation othe rotator cuff.f   . Depression     Past Surgical History  Procedure Date  . Rotator cuff repair     Repair 2004, 2005 and 2007  . Rotator cuff repair 2004  . Rotator  cuff repair     x3    Family History  Problem Relation Age of Onset  . Cancer Mother   . Stroke Father     History  Substance Use Topics  . Smoking status: Current Everyday Smoker -- 0.2 packs/day    Types: Cigarettes  . Smokeless tobacco: Not on file  . Alcohol Use: No    OB History    Grav Para Term Preterm Abortions TAB SAB Ect Mult Living                  Review of Systems ROS reviewed and otherwise negative except for mentioned in HPI  Allergies  Cephalexin; Aspirin; and Codeine  Home Medications   Current Outpatient Rx  Name Route Sig Dispense Refill  . IBUPROFEN 200 MG PO TABS Oral Take 400 mg by mouth every 6 (six) hours as needed. For pain     . LISINOPRIL 10 MG PO TABS Oral Take 10 mg by mouth daily.      Marland Kitchen METFORMIN HCL 500 MG PO TABS Oral Take 500 mg by mouth daily with breakfast.     . OXYCODONE-ACETAMINOPHEN 7.5-500 MG PO TABS Oral Take 1 tablet by mouth 2 (two) times daily as needed. For pain     . OXYCODONE-ACETAMINOPHEN 5-325 MG PO TABS Oral Take 1-2 tablets by mouth every 6 (six) hours as needed for pain. 15 tablet 0    BP 159/87  Pulse 56  Temp(Src)  97.8 F (36.6 C) (Oral)  Resp 16  SpO2 98%  LMP 07/17/2011 Vitals reviewed Physical Exam Physical Examination: General appearance - alert, well appearing, and in no distress Mental status - alert, oriented to person, place, and time Neck - supple, no significant adenopathy, no midline cspine tenderness, mild ttp over bilateral paracervical muscles, FROM Chest - clear to auscultation, no wheezes, rales or rhonchi, symmetric air entry Heart - normal rate, regular rhythm, normal S1, S2, no murmurs, rubs, clicks or gallops Abdomen - soft, nontender, nondistended, no masses or organomegaly Back exam - mild left CVA tenderness, no midline lumbar tenderness to palpation Neurological - alert, oriented, normal speech, no focal findings, sensation and strength exam intact in extremities x  4 Musculoskeletal - no joint tenderness, deformity or swelling, pain with ROM of left shoulder, no bony point tenderness Extremities - peripheral pulses normal, no pedal edema, no clubbing or cyanosis Skin - normal coloration and turgor, no rashes  ED Course  Procedures (including critical care time)  Labs Reviewed  URINALYSIS, ROUTINE W REFLEX MICROSCOPIC - Abnormal; Notable for the following:    APPearance CLOUDY (*)    Specific Gravity, Urine 1.038 (*)    Bilirubin Urine SMALL (*)    Ketones, ur 15 (*)    All other components within normal limits   No results found.   1. Shoulder pain   2. Back pain   3. Neck pain       MDM  Patient with no findings of blood or leukocytes or bacteria in urine flow this lowers my suspicion significantly for ureteral stent or urine infection. Her neck shoulder and back pain appeared to be chronic in nature from review of her prior chart. She was given Toradol IM for pain and afterwards had an episode of emesis. This was treated with Zofran. I have refilled her prescription for Percocet and recommend continuing ibuprofen. I encouraged her to keep her appointment with pain management next week. She was discharged with strict return precautions and is agreeable with the plan.        Ethelda Chick, MD 08/11/11 [phone]

## 2011-08-11 NOTE — ED Notes (Signed)
Pt to ED for eval of l flank pain, and L shoulder pain, and neck pain; pt reports that for the past 4 days she has been having severe L shouler pain; pt reports hx of rotator tear, but does not want surgery done; pt reports she is going to pain clinic on Dec 3rd; pt also states that she recently had UTI and finished abx 1.5 week ago; pt reports that as soon as she finished the abx, she started to have the L falnk pain, and dysuria; pt was on cipro

## 2011-08-11 NOTE — ED Notes (Signed)
Pt has had left shoulder and neck pain x 6 days.  Pt also has HA and right side pain.  No CP with this

## 2011-08-11 NOTE — ED Notes (Signed)
Pt nauseous and actively vomiting at this time..  MD notified.

## 2011-08-11 NOTE — ED Notes (Signed)
EDP NOTIFIED ON PT'S REQUEST FOR PAIN MEDICATIONS , EDP AT BEDSIDE SPEAKING WITH PT.  PT. REFUSED TO SIGN DISCHARGE - PRESCRIPTIONS GIVEN .

## 2011-08-13 ENCOUNTER — Encounter: Payer: Medicare Other | Admitting: Internal Medicine

## 2011-08-20 ENCOUNTER — Other Ambulatory Visit: Payer: Self-pay | Admitting: Internal Medicine

## 2011-09-28 ENCOUNTER — Other Ambulatory Visit: Payer: Self-pay | Admitting: Internal Medicine

## 2011-10-05 ENCOUNTER — Other Ambulatory Visit: Payer: Self-pay | Admitting: Internal Medicine

## 2011-10-05 MED ORDER — LISINOPRIL 10 MG PO TABS: 10.0000 mg | ORAL_TABLET | Freq: Every day | ORAL | Status: DC

## 2011-10-07 ENCOUNTER — Emergency Department (INDEPENDENT_AMBULATORY_CARE_PROVIDER_SITE_OTHER)
Admission: EM | Admit: 2011-10-07 | Discharge: 2011-10-07 | Disposition: A | Discharge status: Home / Self Care | Payer: Medicare Other | Source: Home / Self Care | Attending: Emergency Medicine | Admitting: Emergency Medicine

## 2011-10-07 ENCOUNTER — Encounter (HOSPITAL_COMMUNITY): Payer: Self-pay | Admitting: *Deleted

## 2011-10-07 DIAGNOSIS — M719 Bursopathy, unspecified: Secondary | ICD-10-CM

## 2011-10-07 DIAGNOSIS — M7580 Other shoulder lesions, unspecified shoulder: Secondary | ICD-10-CM

## 2011-10-07 MED ORDER — TRAMADOL HCL 50 MG PO TABS: 100.0000 mg | ORAL_TABLET | Freq: Three times a day (TID) | ORAL | Status: DC | PRN

## 2011-10-07 NOTE — ED Notes (Signed)
Pt  States  She  Has  Chronic  Neck /  Shoulder  Pain     X  3  Days  She  States  She  Has  History  Of  Shoulder  Surgery in past  -  She  denys  Any  Recent  injurys      She  Apparently  Goes to  [facility]  Pain  Clinic

## 2011-10-07 NOTE — ED Provider Notes (Signed)
History     CSN: [id number]  Arrival date & time 10/07/11  1702   First MD Initiated Contact with Patient 10/07/11 1702      Chief Complaint  Patient presents with  . Shoulder Pain    (Consider location/radiation/quality/duration/timing/severity/associated sxs/prior treatment) HPI Comments: Mrs. Halberg comes in tonight demanding a Dilaudid injection for back pain in both shoulders and neck. This is been going on for years. She states she's had multiple rotator cuff repairs done by Dr. Ranell Patrick and also in [place], [place]. None of her surgeries have done any good and have left her with chronic pain and stiffness. She describes diminished range of motion of both of her shoulders, pain with movement, stiffness, but denies any numbness, tingling, or muscle weakness. She is attending a pain clinic on [employer]. She is under a pain Mining engineer. Right now she is getting Percocet 10 mg 4 times daily. She called her pain doctor tonight and he told to come here. He did not call ahead to let us know if she was coming or suggest anything that could be done for her pain. She was here in November for the same thing and at that time demanded an injection for pain as well. When she didn't get it, she abruptly the left the room. She states she has intolerances to all nonsteroidal anti-inflammatory drugs, even know she has been taking ibuprofen, and that Toradol makes her vomit. She apparently gotten some Toradol at the emergency room on one occasion for a similar problem and she had vomiting or spitting up. She states that she has muscle relaxers at home and these don't do any good. She hasn't been back to see her orthopedist for a while, because she does not want any more surgery. She has some tramadol at home, she states this doesn't do much for her. She states that she has taken gabapentin and Lyrica without any relief. She says the only thing that will relieve the pain is an injection of Dilaudid.  She threatens to report me to the hospital administration if I don't give it to her.  Patient is a 51 y.o. female presenting with shoulder pain.  Shoulder Pain    Past Medical History  Diagnosis Date  . Diabetes mellitus   . Hypertension   . Hyperlipidemia   . Chronic shoulder pain 10 years    s/p multiple b/l rotator cuff repair sx - per MRI 04/2010 -  Delamination of the infraspinatus with mild fatty atrophysuggest distal tendon tear with medial propagation. Depending onthe hardware, CT arthrography may add information for evaluation othe rotator cuff.f   . Depression     Past Surgical History  Procedure Date  . Rotator cuff repair     Repair 2004, 2005 and 2007  . Rotator cuff repair 2004  . Rotator cuff repair     x3    Family History  Problem Relation Age of Onset  . Cancer Mother   . Stroke Father     History  Substance Use Topics  . Smoking status: Current Everyday Smoker -- 0.2 packs/day    Types: Cigarettes  . Smokeless tobacco: Not on file  . Alcohol Use: No    OB History    Grav Para Term Preterm Abortions TAB SAB Ect Mult Living                  Review of Systems  Musculoskeletal: Positive for arthralgias. Negative for myalgias, back pain, joint swelling and gait problem.  Skin: Negative for rash and wound.  Neurological: Negative for weakness and numbness.    Allergies  Cephalexin; Aspirin; Codeine; and Toradol  Home Medications   Current Outpatient Rx  Name Route Sig Dispense Refill  . IBUPROFEN 200 MG PO TABS Oral Take 400 mg by mouth every 6 (six) hours as needed. For pain     . LISINOPRIL 10 MG PO TABS Oral Take 1 tablet (10 mg total) by mouth daily. 30 tablet 5  . METFORMIN HCL 500 MG PO TABS  TAKE 1 TABLET BY MOUTH TWICE A DAY 60 tablet 5  . OXYCODONE-ACETAMINOPHEN 5-325 MG PO TABS Oral Take 1-2 tablets by mouth every 6 (six) hours as needed for pain. 15 tablet 0  . OXYCODONE-ACETAMINOPHEN 7.5-500 MG PO TABS Oral Take 1 tablet by mouth  2 (two) times daily as needed. For pain     . TRAMADOL HCL 50 MG PO TABS  TAKE 2 TABLETS 2 TIMES A DAY 120 tablet 0  . TRAMADOL HCL 50 MG PO TABS Oral Take 2 tablets (100 mg total) by mouth every 8 (eight) hours as needed for pain. 30 tablet 0    BP 142/93  Pulse 86  Temp(Src) 97.3 F (36.3 C) (Oral)  Resp 20  SpO2 99%  LMP 09/15/2011  Physical Exam  Nursing note and vitals reviewed. Constitutional: She is oriented to person, place, and time. She appears well-developed and well-nourished. No distress.  Musculoskeletal: Normal range of motion. She exhibits no edema and no tenderness.  Neurological: She is alert and oriented to person, place, and time. She has normal strength and normal reflexes. She displays no atrophy. No sensory deficit. She exhibits normal muscle tone.       Her posterior trapezius ridge and cervical spine is tender to palpation, although her neck has fairly good range of motion but with pain on movement. Exam of her shoulders reveals diffuse pain to palpation in the entire shoulder girdle and upper arms. Her shoulders and have a diminished range of motion with pain on movement. Muscle strength is normal. DTRs and sensation are normal, pulses are full.  Skin: Skin is warm and dry. No rash noted. She is not diaphoretic.    ED Course  Procedures (including critical care time)  The patient was very demanding of a Dilaudid injection. I do not feel is appropriate to give her Dilaudid at this time, especially since she's under a pain management contract with her control doctor and is on a fairly large dose of Percocet at home. I tried to explain this to her, but she became even more angry and demanding. I did offer her a prescription for tramadol which I sent into her pharmacy. I don't think she was fully satisfied with this, but this is only thing that really comfortable in doing. I suggested that she followup with her primary care physician, her orthopedist, and her pain  management doctor as soon as possible.  Labs Reviewed - No data to display No results found.   1. Rotator cuff tendonitis       MDM  This unfortunate lady does have rotator cuff pain, but she is very demanding of a Dilaudid injection, which I am not comfortable in giving her did give her chin for tramadol with instructions to followup with her pain management specialist and her orthopedist as soon as possible.  I am concerned as was the previous physician who saw her that she is exhibiting a red flag symptoms for drug-seeking behavior. She  reported to me the same and inaccurate statement that the Dilaudid effect would last for a week. She declined any other form of pain management other than a Dilaudid injection. She reports intolerances to all other pain medications. She is obviously having dose escalation, and she's had multiple emergency room and office visits for pain and narcotic refills.        Roque Lias, MD 10/07/11 2005

## 2011-10-20 ENCOUNTER — Encounter: Payer: Self-pay | Admitting: Internal Medicine

## 2011-10-20 ENCOUNTER — Ambulatory Visit (INDEPENDENT_AMBULATORY_CARE_PROVIDER_SITE_OTHER): Payer: Medicare Other | Admitting: Internal Medicine

## 2011-10-20 VITALS — BP 121/74 | HR 85 | Temp 97.5°F | Ht 65.5 in | Wt 154.0 lb

## 2011-10-20 DIAGNOSIS — F411 Generalized anxiety disorder: Secondary | ICD-10-CM

## 2011-10-20 DIAGNOSIS — F419 Anxiety disorder, unspecified: Secondary | ICD-10-CM

## 2011-10-20 DIAGNOSIS — Z23 Encounter for immunization: Secondary | ICD-10-CM

## 2011-10-20 DIAGNOSIS — Z0000 Encounter for general adult medical examination without abnormal findings: Secondary | ICD-10-CM | POA: Insufficient documentation

## 2011-10-20 DIAGNOSIS — I10 Essential (primary) hypertension: Secondary | ICD-10-CM

## 2011-10-20 DIAGNOSIS — E039 Hypothyroidism, unspecified: Secondary | ICD-10-CM

## 2011-10-20 DIAGNOSIS — E119 Type 2 diabetes mellitus without complications: Secondary | ICD-10-CM

## 2011-10-20 DIAGNOSIS — R5383 Other fatigue: Secondary | ICD-10-CM

## 2011-10-20 DIAGNOSIS — D649 Anemia, unspecified: Secondary | ICD-10-CM

## 2011-10-20 DIAGNOSIS — E785 Hyperlipidemia, unspecified: Secondary | ICD-10-CM

## 2011-10-20 LAB — GLUCOSE, CAPILLARY: Glucose-Capillary: 124 mg/dL — ABNORMAL HIGH (ref 70–99)

## 2011-10-20 LAB — COMPLETE METABOLIC PANEL WITH GFR
ALT: 10 U/L (ref 0–35)
AST: 13 U/L (ref 0–37)
Alkaline Phosphatase: 71 U/L (ref 39–117)
GFR, Est Non African American: 88 mL/min
Glucose, Bld: 132 mg/dL — ABNORMAL HIGH (ref 70–99)
Potassium: 4.1 mEq/L (ref 3.5–5.3)
Sodium: 137 mEq/L (ref 135–145)
Total Bilirubin: 0.2 mg/dL — ABNORMAL LOW (ref 0.3–1.2)
Total Protein: 7.2 g/dL (ref 6.0–8.3)

## 2011-10-20 LAB — CBC WITH DIFFERENTIAL/PLATELET
Basophils Absolute: 0.1 10*3/uL (ref 0.0–0.1)
Basophils Relative: 1 % (ref 0–1)
Eosinophils Absolute: 0.4 10*3/uL (ref 0.0–0.7)
MCH: 24.6 pg — ABNORMAL LOW (ref 26.0–34.0)
MCHC: 29.9 g/dL — ABNORMAL LOW (ref 30.0–36.0)
Neutro Abs: 4.9 10*3/uL (ref 1.7–7.7)
Neutrophils Relative %: 46 % (ref 43–77)
Platelets: 322 10*3/uL (ref 150–400)

## 2011-10-20 LAB — LIPID PANEL
Total CHOL/HDL Ratio: 4.4 Ratio
VLDL: 28 mg/dL (ref 0–40)

## 2011-10-20 LAB — POCT GLYCOSYLATED HEMOGLOBIN (HGB A1C): Hemoglobin A1C: 6.9

## 2011-10-20 MED ORDER — METFORMIN HCL 500 MG PO TABS: 500.0000 mg | ORAL_TABLET | Freq: Two times a day (BID) | ORAL | Status: DC

## 2011-10-20 MED ORDER — LISINOPRIL 10 MG PO TABS: 10.0000 mg | ORAL_TABLET | Freq: Every day | ORAL | Status: DC

## 2011-10-20 NOTE — Assessment & Plan Note (Signed)
She thought she got a pneumococcal shot today. She would like to get Pap smear done with next visit.

## 2011-10-20 NOTE — Assessment & Plan Note (Addendum)
Lab Results  Component Value Date   HGBA1C 6.9 10/20/2011   HGBA1C 7.0 08/24/2010   CREATININE 0.84 12/21/2010   MICROALBUR 1.31 07/01/2009   MICRALBCREAT 4.1 07/01/2009   CHOL 182 03/17/2010   HDL 35* 03/17/2010   TRIG 168* 03/17/2010    Last eye exam and foot exam: Referred for opthalmology exam. No results found for this basename: HMDIABEYEEXA, HMDIABFOOTEX    Assessment: Diabetes control: controlled Progress toward goals: at goal Barriers to meeting goals: no barriers identified  Plan: Diabetes treatment: continue current medications Refer to: none Instruction/counseling given: reminded to get eye exam, reminded to bring blood glucose meter & log to each visit, reminded to bring medications to each visit, discussed foot care, discussed the need for weight loss and discussed diet

## 2011-10-20 NOTE — Progress Notes (Signed)
  Subjective:    Patient ID: Kaylee Murphy, female    DOB: March 29, 1961, 51 y.o.   MRN: [id number]  HPI: A 51-year-old woman with past medical history significant for diabetes, hypertension, hyperlipidemia comes to the clinic for a followup visit.  1) Anxiety: She states that she has been feeling very anxious for last 1 month but couldn't find any particular reason. She also reports feeling fatigued and having occasional palpitations. Denies any excessive hair loss, diarrhea, weight loss, chest pain,  nausea or vomiting.   2) Medication refill: Requesting medication refill     Review of Systems  Constitutional: Negative for fever, chills and fatigue.  HENT: Negative for nosebleeds, congestion, rhinorrhea and sneezing.   Respiratory: Negative for apnea, choking and chest tightness.   Cardiovascular: Positive for palpitations. Negative for chest pain and leg swelling.  Musculoskeletal: Negative for arthralgias.  Neurological: Negative for dizziness, facial asymmetry and headaches.  Hematological: Negative for adenopathy.       Objective:   Physical Exam  Constitutional: She is oriented to person, place, and time. She appears well-developed and well-nourished.  HENT:  Head: Normocephalic and atraumatic.  Mouth/Throat: No oropharyngeal exudate.  Neck: Normal range of motion. Neck supple. No JVD present. No tracheal deviation present. No thyromegaly present.  Cardiovascular: Normal rate and regular rhythm.  Exam reveals no gallop and no friction rub.   No murmur heard. Pulmonary/Chest: Effort normal and breath sounds normal. No stridor. No respiratory distress. She has no wheezes. She has no rales. She exhibits no tenderness.  Abdominal: Soft. Bowel sounds are normal. She exhibits no distension and no mass. There is no tenderness. There is no rebound and no guarding.  Musculoskeletal: Normal range of motion. She exhibits no edema and no tenderness.  Lymphadenopathy:    She has no cervical  adenopathy.  Neurological: She is alert and oriented to person, place, and time. She has normal reflexes. She displays normal reflexes. No cranial nerve deficit. Coordination normal.          Assessment & Plan:

## 2011-10-20 NOTE — Assessment & Plan Note (Signed)
Check lipid panel  

## 2011-10-20 NOTE — Assessment & Plan Note (Signed)
Lab Results  Component Value Date   NA 139 12/21/2010   K 3.8 12/21/2010   CL 107 12/21/2010   CO2 26 12/21/2010   BUN 11 12/21/2010   CREATININE 0.84 12/21/2010    BP Readings from Last 3 Encounters:  10/20/11 121/74  10/07/11 142/93  08/11/11 159/87    Assessment: Hypertension control:  controlled  Progress toward goals:  at goal Barriers to meeting goals:  no barriers identified  Plan: Hypertension treatment:  continue current medications

## 2011-10-20 NOTE — Assessment & Plan Note (Signed)
She reports having some anxiety associated with occasional palpitations for last one year. - Check TSH.

## 2011-10-20 NOTE — Patient Instructions (Signed)
Please schedule a follow up appointment in 3 months. Please take your medicines as prescribed. I will call you with your lab results if anything will be abnormal.

## 2011-10-21 LAB — TSH: TSH: 1.974 u[IU]/mL (ref 0.350–4.500)

## 2011-10-22 NOTE — Progress Notes (Signed)
Addended by: Bufford Spikes on: 10/22/2011 09:19 AM   Modules accepted: Orders

## 2011-10-23 ENCOUNTER — Emergency Department (HOSPITAL_COMMUNITY): Payer: Medicare Other

## 2011-10-23 ENCOUNTER — Other Ambulatory Visit: Payer: Self-pay

## 2011-10-23 ENCOUNTER — Observation Stay (HOSPITAL_COMMUNITY)
Admission: EM | Admit: 2011-10-23 | Discharge: 2011-10-24 | Disposition: A | Discharge status: Home / Self Care | Payer: Medicare Other | Attending: Internal Medicine | Admitting: Internal Medicine

## 2011-10-23 ENCOUNTER — Encounter (HOSPITAL_COMMUNITY): Payer: Self-pay | Admitting: *Deleted

## 2011-10-23 DIAGNOSIS — D509 Iron deficiency anemia, unspecified: Secondary | ICD-10-CM

## 2011-10-23 DIAGNOSIS — I498 Other specified cardiac arrhythmias: Secondary | ICD-10-CM | POA: Insufficient documentation

## 2011-10-23 DIAGNOSIS — I10 Essential (primary) hypertension: Secondary | ICD-10-CM | POA: Insufficient documentation

## 2011-10-23 DIAGNOSIS — D72829 Elevated white blood cell count, unspecified: Secondary | ICD-10-CM | POA: Insufficient documentation

## 2011-10-23 DIAGNOSIS — R079 Chest pain, unspecified: Principal | ICD-10-CM

## 2011-10-23 DIAGNOSIS — G8929 Other chronic pain: Secondary | ICD-10-CM | POA: Insufficient documentation

## 2011-10-23 DIAGNOSIS — E119 Type 2 diabetes mellitus without complications: Secondary | ICD-10-CM | POA: Insufficient documentation

## 2011-10-23 DIAGNOSIS — E785 Hyperlipidemia, unspecified: Secondary | ICD-10-CM | POA: Insufficient documentation

## 2011-10-23 DIAGNOSIS — M25519 Pain in unspecified shoulder: Secondary | ICD-10-CM | POA: Insufficient documentation

## 2011-10-23 DIAGNOSIS — F3289 Other specified depressive episodes: Secondary | ICD-10-CM | POA: Insufficient documentation

## 2011-10-23 DIAGNOSIS — F329 Major depressive disorder, single episode, unspecified: Secondary | ICD-10-CM | POA: Insufficient documentation

## 2011-10-23 LAB — CBC
HCT: 31.1 % — ABNORMAL LOW (ref 36.0–46.0)
Hemoglobin: 10 g/dL — ABNORMAL LOW (ref 12.0–15.0)
MCH: 25.4 pg — ABNORMAL LOW (ref 26.0–34.0)
MCV: 78.9 fL (ref 78.0–100.0)
RBC: 3.94 MIL/uL (ref 3.87–5.11)

## 2011-10-23 LAB — CARDIAC PANEL(CRET KIN+CKTOT+MB+TROPI)
Relative Index: INVALID (ref 0.0–2.5)
Total CK: 99 U/L (ref 7–177)
Troponin I: <0.3 ng/mL (ref <0.30)

## 2011-10-23 LAB — IRON AND TIBC
%SAT: 6 % — ABNORMAL LOW (ref 20–55)
TIBC: 410 ug/dL (ref 250–470)

## 2011-10-23 LAB — BASIC METABOLIC PANEL
BUN: 10 mg/dL (ref 6–23)
CO2: 23 mEq/L (ref 19–32)
Calcium: 9.8 mg/dL (ref 8.4–10.5)
Creatinine, Ser: 0.67 mg/dL (ref 0.50–1.10)
Glucose, Bld: 96 mg/dL (ref 70–99)
Sodium: 137 mEq/L (ref 135–145)

## 2011-10-23 LAB — FERRITIN: Ferritin: 22 ng/mL (ref 10–291)

## 2011-10-23 LAB — GLUCOSE, CAPILLARY

## 2011-10-23 MED ORDER — ONDANSETRON HCL 4 MG/2ML IJ SOLN
4.0000 mg | Freq: Once | INTRAMUSCULAR | Status: AC
  Administered 2011-10-23: 4 mg via INTRAVENOUS
  Filled 2011-10-23: qty 2

## 2011-10-23 MED ORDER — MORPHINE SULFATE 2 MG/ML IJ SOLN
2.0000 mg | Freq: Four times a day (QID) | INTRAMUSCULAR | Status: DC | PRN
  Administered 2011-10-24 (×2): 2 mg via INTRAVENOUS
  Filled 2011-10-23 (×2): qty 1

## 2011-10-23 MED ORDER — SODIUM CHLORIDE 0.9 % IV SOLN
INTRAVENOUS | Status: AC
  Administered 2011-10-23 – 2011-10-24 (×2): via INTRAVENOUS

## 2011-10-23 MED ORDER — MORPHINE SULFATE 4 MG/ML IJ SOLN
6.0000 mg | Freq: Once | INTRAMUSCULAR | Status: AC
  Administered 2011-10-23: 6 mg via INTRAVENOUS
  Filled 2011-10-23: qty 2

## 2011-10-23 MED ORDER — INSULIN ASPART 100 UNIT/ML ~~LOC~~ SOLN
0.0000 [IU] | SUBCUTANEOUS | Status: DC
  Administered 2011-10-24 (×2): 1 [IU] via SUBCUTANEOUS
  Filled 2011-10-23: qty 3

## 2011-10-23 MED ORDER — ENOXAPARIN SODIUM 40 MG/0.4ML ~~LOC~~ SOLN
40.0000 mg | SUBCUTANEOUS | Status: DC
  Administered 2011-10-24: 40 mg via SUBCUTANEOUS
  Filled 2011-10-23 (×2): qty 0.4

## 2011-10-23 MED ORDER — MORPHINE SULFATE 4 MG/ML IJ SOLN
4.0000 mg | Freq: Once | INTRAMUSCULAR | Status: AC
  Administered 2011-10-23: 4 mg via INTRAVENOUS
  Filled 2011-10-23: qty 1

## 2011-10-23 MED ORDER — NITROGLYCERIN 0.4 MG SL SUBL
0.4000 mg | SUBLINGUAL_TABLET | SUBLINGUAL | Status: AC | PRN
  Administered 2011-10-23 (×3): 0.4 mg via SUBLINGUAL
  Filled 2011-10-23: qty 25

## 2011-10-23 MED ORDER — ONDANSETRON HCL 4 MG PO TABS: 4.0000 mg | ORAL_TABLET | Freq: Four times a day (QID) | ORAL | Status: DC | PRN

## 2011-10-23 MED ORDER — LISINOPRIL 10 MG PO TABS
10.0000 mg | ORAL_TABLET | Freq: Every day | ORAL | Status: DC
  Administered 2011-10-24: 10 mg via ORAL
  Filled 2011-10-23: qty 1

## 2011-10-23 MED ORDER — FERROUS SULFATE 325 (65 FE) MG PO TABS
325.0000 mg | ORAL_TABLET | Freq: Three times a day (TID) | ORAL | Status: DC
  Administered 2011-10-24 (×2): 325 mg via ORAL
  Filled 2011-10-23 (×4): qty 1

## 2011-10-23 MED ORDER — ACETAMINOPHEN 325 MG PO TABS: 650.0000 mg | ORAL_TABLET | Freq: Four times a day (QID) | ORAL | Status: DC | PRN

## 2011-10-23 MED ORDER — POTASSIUM CHLORIDE CRYS ER 20 MEQ PO TBCR
40.0000 meq | EXTENDED_RELEASE_TABLET | Freq: Once | ORAL | Status: AC
  Administered 2011-10-23: 40 meq via ORAL
  Filled 2011-10-23: qty 2

## 2011-10-23 MED ORDER — SODIUM CHLORIDE 0.9 % IV SOLN
Freq: Once | INTRAVENOUS | Status: AC
  Administered 2011-10-23: 19:00:00 via INTRAVENOUS

## 2011-10-23 MED ORDER — ASPIRIN 325 MG PO TABS
325.0000 mg | ORAL_TABLET | Freq: Once | ORAL | Status: AC
  Administered 2011-10-23: 325 mg via ORAL
  Filled 2011-10-23: qty 1

## 2011-10-23 MED ORDER — ACETAMINOPHEN 650 MG RE SUPP: 650.0000 mg | Freq: Four times a day (QID) | RECTAL | Status: DC | PRN

## 2011-10-23 MED ORDER — PANTOPRAZOLE SODIUM 40 MG PO TBEC
40.0000 mg | DELAYED_RELEASE_TABLET | Freq: Every day | ORAL | Status: DC
  Administered 2011-10-23 – 2011-10-24 (×2): 40 mg via ORAL
  Filled 2011-10-23 (×2): qty 1

## 2011-10-23 MED ORDER — ONDANSETRON HCL 4 MG/2ML IJ SOLN: 4.0000 mg | Freq: Four times a day (QID) | INTRAMUSCULAR | Status: DC | PRN

## 2011-10-23 NOTE — H&P (Signed)
Hospital Admission Note Date: 10/23/2011  Patient name: Kaylee Murphy Medical record number: [id number] Date of birth: 02-10-1961 Age: 51 y.o. Gender: female PCP: Lars Mage, MD, MD  Medical Service: IMTS  Attending physician:     1st Contact: Janalyn Harder   Pager: [phone] 2nd Contact: Lars Mage   Pager: [phone]   After 5 pm or weekends: 1st Contact:      Pager: [phone] 2nd Contact:      Pager: [phone]  Chief Complaint: chest pain  History of Present Illness:  Patient is 51 yo lady with PMH of HTN, DM-II (A1c 6.9 on 10/20/11), HLD  (LDL 95 on 2/6/130 and Depression, who present with chest pain.   Per patient, her chest pan started 3 hours ago prior to coming to the ED, when paint was sitting in the chair. The pain is pressure-like, located at substernal area, 7/10 in severity, radiating to her back (the pain goes through her chest directly to her back per patient). The pain is constant. She tried percocet and ASA without help. It is associated with mild SOB, but not with palpitation, cough, running nose or sore throat. Her chest pain is aggregated by walking. Deep breath makes her feel suffocated, but does not affect her chest pain.   Denies fever, chills, headaches,  abdominal pain,diarrhea, constipation, dysuria, urgency, frequency, hematuria, joint pain or leg swelling. No long distant traveling history. No pain in he calf area.    Allergies: Aspirin: Stomach burning Keflex:a swelling in tongue and throat Codeine: ithcing Toradol:    Meds: Medications Prior to Admission  Medication Dose Route Frequency Provider Last Rate Last Dose  . 0.9 %  sodium chloride infusion   Intravenous Once Forbes Cellar, MD 10 mL/hr at 10/23/11 1836    . aspirin tablet 325 mg  325 mg Oral Once Verne Carrow, MD   325 mg at 10/23/11 1833  . morphine 4 MG/ML injection 4 mg  4 mg Intravenous Once Verne Carrow, MD   4 mg at 10/23/11 2002  . morphine 4 MG/ML injection 6 mg  6 mg Intravenous  Once Verne Carrow, MD   6 mg at 10/23/11 1931  . nitroGLYCERIN (NITROSTAT) SL tablet 0.4 mg  0.4 mg Sublingual Q5 min PRN Verne Carrow, MD   0.4 mg at 10/23/11 1848  . ondansetron (ZOFRAN) injection 4 mg  4 mg Intravenous Once Verne Carrow, MD   4 mg at 10/23/11 1835   Medications Prior to Admission  Medication Sig Dispense Refill  . ibuprofen (ADVIL,MOTRIN) 200 MG tablet  Take 400 mg by mouth every 6 (six) hours as needed. For pain       . lisinopril (PRINIVIL,ZESTRIL) 10 MG tablet  Take 10 mg by mouth daily.      Marland Kitchen oxyCODONE-acetaminophen (PERCOCET) 7.5-500 MG per tablet Take 1 tablet by mouth 2 (two) times daily as needed. For pain        . Metformin 500 mg Bid po        Allergies:  Aspirin: Stomach burning Keflex:a swelling in tongue and throat Codeine: ithcing Toradol:    Past Medical History  Diagnosis Date  . Diabetes mellitus (A1c is 6.9 on 10/20/11)    . Hypertension    . Hyperlipidemia (LDL 95 on 10/20/11)    . Chronic shoulder pain:  Had right shoulder surgery on 2004 and 2005, and left shoulder surgery on 2006 due to rotator cuff tear.       s/p multiple b/l rotator  cuff repair sx - per MRI 04/2010 -  Delamination of the infraspinatus with mild fatty atrophysuggest distal tendon tear with medial propagation. Depending onthe hardware, CT arthrography may add information for evaluation othe rotator cuff.f    . Depression: stable.     Past Surgical History  Procedure Date  . Rotator cuff repair     Repair 2004, 2005 and 2006   . Tubal ligation.            Family Hx:  Mother died of cervical cancer at age of 22; father died of stroke at age of 67. Both parents had HTN and DM-II before passed away.  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 Smokes 5 cig each day for 20 years. Not drinking alcohol and  using drugs.  Insurance: [employer]  Review of Systems:  General: no fevers, chills, no changes in body weight, no changes in appetite Skin: no rash HEENT: no blurry vision, hearing changes or sore throat Pulm: no dyspnea, coughing, wheezing CV: has chest pain, mild shortness of breath, no palpitations, Abd: no nausea/vomiting, abdominal pain, diarrhea/constipation GU: no dysuria, hematuria, polyuria Ext: chronic shoulder pain bilaterally. Neuro: no weakness, numbness, or tingling   Physical Exam:  Vitals: T: 97.6       HR: 81       BP:  115/83     RR:18        O2 saturation: 100%   General: resting in bed, not in acute distress. Dry mucous and membrane. HEENT: PERRL, EOMI, no scleral icterus, no JVD or bruit Cardiac: S1/S2, Irregular rhythm, No murmurs, gallops or rubs Pulm: Good air movement bilaterally, Clear to auscultation bilaterally, No rales, wheezing, rhonchi or rubs. Chest wall: no tenderness over chest wall Abd: Soft,  nondistended, nontender, no rebound pain, no organomegaly, BS present Ext: No rashes or edema, 2+DP/PT pulse bilaterally Neuro: alert and oriented X3, cranial nerves II-XII grossly intact, muscle strength 5/5 in all extremeties,  sensation to light touch intact.   Lab results: Basic Metabolic Panel:  Basename 10/23/11 1820  NA 137  K 3.4*  CL 103  CO2 23  GLUCOSE 96  BUN 10  CREATININE 0.67  CALCIUM 9.8  MG --  PHOS --    CBC:  Basename 10/23/11 1820  WBC 12.3*  NEUTROABS --  HGB 10.0*  HCT 31.1*  MCV 78.9  PLT 407*    Cardiac Enzymes:  Basename 10/23/11 1840  CKTOTAL --  CKMB --  CKMBINDEX --  TROPONINI <0.30    Basename 10/23/11 1841  GLUCAP 105*   Drugs of Abuse     Component Value Date/Time   LABOPIA NEG 06/22/2010 2104   COCAINSCRNUR NEG 06/22/2010 2104   LABBENZ NEG 06/22/2010 2104   AMPHETMU NEG 06/22/2010 2104     Imaging results:  Dg Chest 2 View  10/23/2011  *RADIOLOGY REPORT*  Clinical Data:  Shortness of breath.  Hypertension.  Diabetes. Chest pain.  CHEST - 2 VIEW  Comparison: 12/21/2010  Findings: The cardiomediastinal silhouette is within normal limits. Lungs are free of focal consolidations and pleural effusions. Visualized osseous structures have a normal appearance.  The patient has had prior right shoulder ligamentous repair.  IMPRESSION: No evidence for acute cardiopulmonary abnormality.  Original Report Authenticated By: Patterson Hammersmith, M.D.    Other results:  EKG: on first  EKG,  regular sinus rhythm, normal axis, norma R wave progression. T wave flattening on inferior and precordial leads. Low voltage on inferior leads. On the repeated EKG, there is ventricular bigeminy.   Assessment & Plan by Problem:  # chest pain: Her presentation is concerning for ACS. Her EKG is abnormal, His risk factors include her hypertension, hyperlipidemia, smoking. But his POC troponin was negative. Other differential diagnosis includes, but less likely, PE (less likely, low probability by Well's score), PNA (no fever, cough, negative chest-Xray), aortic dissection ( less likely, although radiating to the back, yet no typical tearing pain, no mediastinal widening on chest X-ray).  Other possibilities include, but can not completely ruled out, cocaine abuse and anxiety given history of mental illness,  GERD (patient has reflux problem, but describe that her chest pain is different with GERD).   Patient was admitted Telemetry bed for ruling out ACS  Cycle CE q8 x3 and repeat her EKG in the am  Treat with Nitroglycerin, Morphine.  No po ASA due to stomach burning side effect, but can give by suppository    Risk stratification: she had lipid panel,  HbA1c and normal TSH on 10/20/11. Will not repeat. Will get UDS.  Consider cardiology consult if test positive for CEs  Consult for smoking cessation.  # HTN: her bp is well controlled. Will continue lisinopril.  #HLD: LDL was 95 on 10/20/11. But  LDL used to be high at year 2011. Not on statin now. May treat patient with statin for targeting LDL of less than 70 given her significant risk factors.  # Leukocytosis and thrombocytosis: likely due to blood concentration. Patient WBC and platelets were 10.6 and 322 on 10/20/11. She has dry mucous and membrane. Will give IVF 125 cc/hour for 10 hours and follow up.  # Iron deficient anemia: anemia panel on 10/20/11 showed low iron of 24 and low range of ferritin of 22, which  is consistent with iron deficient anemia. will treat with ferrous sulfate. Since she is 51 year old, will consider colonoscopy at outpatient setting. Will also consider trans-vaginal [place] to assess possible fibroid at outpatient setting since patient mentioned having had fibroid in the past.  # Depression: stable. Not on medications. No suicidal ideations. No treatment now.   # DVT PPX: Lovenox    Signed: Lorretta Harp 10/23/2011, 10:26 PM

## 2011-10-23 NOTE — ED Provider Notes (Signed)
History     CSN: [id number]  Arrival date & time 10/23/11  1753   First MD Initiated Contact with Patient 10/23/11 1815      Chief Complaint  Patient presents with  . Chest Pain    (Consider location/radiation/quality/duration/timing/severity/associated sxs/prior treatment) Patient is a 51 y.o. female presenting with chest pain. The history is provided by the patient.  Chest Pain The chest pain began 1 - 2 hours ago. Chest pain occurs constantly. The chest pain is unchanged. The pain is associated with exertion. The severity of the pain is moderate. The quality of the pain is described as pressure-like. Radiates to: tingling in left arm. Chest pain is worsened by exertion. Primary symptoms include shortness of breath and nausea. Pertinent negatives for primary symptoms include no fever, no fatigue, no syncope, no cough, no wheezing, no palpitations, no abdominal pain, no vomiting, no dizziness and no altered mental status.  Pertinent negatives for associated symptoms include no numbness and no weakness. She tried nothing for the symptoms.  Her past medical history is significant for diabetes.  Pertinent negatives for past medical history include no CAD, no hyperlipidemia, no hypertension and no seizures.  Her family medical history is significant for CAD in family.     Past Medical History  Diagnosis Date  . Diabetes mellitus   . Hypertension   . Hyperlipidemia   . Chronic shoulder pain 10 years    s/p multiple b/l rotator cuff repair sx - per MRI 04/2010 -  Delamination of the infraspinatus with mild fatty atrophysuggest distal tendon tear with medial propagation. Depending onthe hardware, CT arthrography may add information for evaluation othe rotator cuff.f   . Depression     Past Surgical History  Procedure Date  . Rotator cuff repair     Repair 2004, 2005 and 2007  . Rotator cuff repair 2004  . Rotator cuff repair     x3    Family History  Problem Relation Age of  Onset  . Cancer Mother   . Stroke Father     History  Substance Use Topics  . Smoking status: Current Everyday Smoker -- 0.2 packs/day    Types: Cigarettes  . Smokeless tobacco: Not on file   Comment: 5-6 cigs/day  . Alcohol Use: No    OB History    Grav Para Term Preterm Abortions TAB SAB Ect Mult Living                  Review of Systems  Constitutional: Negative for fever, chills, activity change, appetite change and fatigue.  HENT: Negative for congestion, sore throat, rhinorrhea, neck pain and neck stiffness.   Eyes: Negative for photophobia, redness and visual disturbance.  Respiratory: Positive for shortness of breath. Negative for cough and wheezing.   Cardiovascular: Positive for chest pain. Negative for palpitations, leg swelling and syncope.  Gastrointestinal: Positive for nausea. Negative for vomiting, abdominal pain, diarrhea, constipation and blood in stool.  Genitourinary: Negative for dysuria, urgency, hematuria and flank pain.  Musculoskeletal: Negative for back pain.  Skin: Negative for rash and wound.  Neurological: Negative for dizziness, seizures, facial asymmetry, speech difficulty, weakness, light-headedness, numbness and headaches.  Psychiatric/Behavioral: Negative for confusion and altered mental status.  All other systems reviewed and are negative.    Allergies  Cephalexin; Aspirin; Codeine; Keflex; and Toradol  Home Medications   Current Outpatient Rx  Name Route Sig Dispense Refill  . IBUPROFEN 200 MG PO TABS Oral Take 400 mg by mouth every  6 (six) hours as needed. For pain     . LISINOPRIL 10 MG PO TABS Oral Take 10 mg by mouth daily.    Marland Kitchen METFORMIN HCL 500 MG PO TABS Oral Take 500 mg by mouth 2 (two) times daily with a meal.    . OXYCODONE-ACETAMINOPHEN 7.5-500 MG PO TABS Oral Take 1 tablet by mouth 2 (two) times daily as needed. For pain     . TRAMADOL HCL 50 MG PO TABS Oral Take 100 mg by mouth 2 (two) times daily.    .  OXYCODONE-ACETAMINOPHEN 5-325 MG PO TABS Oral Take 1-2 tablets by mouth every 6 (six) hours as needed. For pain.      BP 153/84  Pulse 100  Temp(Src) 97.6 F (36.4 C) (Oral)  Resp 20  SpO2 100%  LMP 09/22/2011  Physical Exam  Nursing note and vitals reviewed. Constitutional: She is oriented to person, place, and time. She appears well-developed and well-nourished.  Non-toxic appearance. No distress.  HENT:  Head: Normocephalic and atraumatic.  Mouth/Throat: Oropharynx is clear and moist.  Eyes: Conjunctivae and EOM are normal. Pupils are equal, round, and reactive to light. No scleral icterus.  Neck: Normal range of motion. Neck supple. No JVD present.  Cardiovascular: Normal rate, regular rhythm, S1 normal, S2 normal, normal heart sounds and intact distal pulses.   No murmur heard. Pulmonary/Chest: Effort normal and breath sounds normal. No respiratory distress. She has no wheezes. She has no rales.  Abdominal: Soft. Bowel sounds are normal. She exhibits no distension. There is no tenderness. There is no rebound and no guarding.  Musculoskeletal: Normal range of motion. She exhibits no edema.       Negative Homan's sign.  Neurological: She is alert and oriented to person, place, and time. She has normal strength. No cranial nerve deficit. GCS eye subscore is 4. GCS verbal subscore is 5. GCS motor subscore is 6.  Skin: Skin is warm and dry. No rash noted. She is not diaphoretic.  Psychiatric: She has a normal mood and affect.    ED Course  Procedures (including critical care time)   Date: 10/23/2011  Rate: 94  Rhythm: normal sinus rhythm  QRS Axis: normal  Intervals: normal  ST/T Wave abnormalities: nonspecific ST changes  Conduction Disutrbances:none  Narrative Interpretation: Mild Twave flattening in V5 and V6 otherwise unch  Old EKG Reviewed: changes noted    Labs Reviewed  CBC - Abnormal; Notable for the following:    WBC 12.3 (*)    Hemoglobin 10.0 (*)    HCT  31.1 (*)    MCH 25.4 (*)    RDW 16.9 (*)    Platelets 407 (*)    All other components within normal limits  BASIC METABOLIC PANEL - Abnormal; Notable for the following:    Potassium 3.4 (*)    All other components within normal limits  GLUCOSE, CAPILLARY - Abnormal; Notable for the following:    Glucose-Capillary 105 (*)    All other components within normal limits  GLUCOSE, CAPILLARY - Abnormal; Notable for the following:    Glucose-Capillary 133 (*)    All other components within normal limits  TROPONIN I  CARDIAC PANEL(CRET KIN+CKTOT+MB+TROPI)  CBC  COMPREHENSIVE METABOLIC PANEL  CARDIAC PANEL(CRET KIN+CKTOT+MB+TROPI)   Dg Chest 2 View  10/23/2011  *RADIOLOGY REPORT*  Clinical Data: Shortness of breath.  Hypertension.  Diabetes. Chest pain.  CHEST - 2 VIEW  Comparison: 12/21/2010  Findings: The cardiomediastinal silhouette is within normal limits. Lungs are  free of focal consolidations and pleural effusions. Visualized osseous structures have a normal appearance.  The patient has had prior right shoulder ligamentous repair.  IMPRESSION: No evidence for acute cardiopulmonary abnormality.  Original Report Authenticated By: Patterson Hammersmith, M.D.     1. Chest pain   2. Ventricular bigeminy       MDM  50yo AAF with PMH significant for DM, HTN, and HLD who presents to the ED due to chest pain. Pain onset about 1.5h PTA while at rest. Described as heavy pressure in center of her chest with some tingling in her left arm. Worse c exertion. Has had intermitted sharp chest pains in the past but has never had this heavy sensation. Mother and father with early onset heart disease. EKG with mild Twave flattening in lateral leads but otherwise unchanged from prior. Concerned for UA vs NSTEMI. Doubt PE, PERC neg, low risk Wells. Giving ASA and NTG. Getting cardiac markers. Will reassess. Plan for admission.   At 7:17 PM pt reassessed. Pt received 3 NTG no improvement in pain. Now has HA.  Giving morphine. Trop pending.   Pts pain improved after 2 doses morphine. Internal medicine consulted for admission. Initial troponin negative. Pt having bigeminy at this time.    Verne Carrow, MD 10/23/11 [phone]

## 2011-10-23 NOTE — ED Notes (Signed)
Reports onset one hour ago of mid chest pressure, sob and nausea. ekg being done at triage. Airway intact, resp e/u.

## 2011-10-23 NOTE — ED Notes (Signed)
Pt c/o mid-sternum chest pain radiating into her back, nausea, sob, and tingling in Left arm x2 hrs. Pt denies abd/back pain, dizziness, and/or diaphoresis. Pt describes her chest pain as crushing pressure, rates pain 7/10, states "it feel like something is sitting on my chest."

## 2011-10-23 NOTE — ED Notes (Signed)
Patient transported to X-ray 

## 2011-10-23 NOTE — ED Notes (Signed)
115/83 

## 2011-10-24 ENCOUNTER — Other Ambulatory Visit: Payer: Self-pay

## 2011-10-24 LAB — RAPID URINE DRUG SCREEN, HOSP PERFORMED
Barbiturates: NOT DETECTED
Benzodiazepines: NOT DETECTED
Cocaine: NOT DETECTED
Tetrahydrocannabinol: NOT DETECTED

## 2011-10-24 LAB — COMPREHENSIVE METABOLIC PANEL
BUN: 10 mg/dL (ref 6–23)
Calcium: 8.9 mg/dL (ref 8.4–10.5)
GFR calc Af Amer: >90 mL/min (ref >90)
Glucose, Bld: 136 mg/dL — ABNORMAL HIGH (ref 70–99)
Total Protein: 7.1 g/dL (ref 6.0–8.3)

## 2011-10-24 LAB — GLUCOSE, CAPILLARY

## 2011-10-24 LAB — CBC
HCT: 31 % — ABNORMAL LOW (ref 36.0–46.0)
Hemoglobin: 9.9 g/dL — ABNORMAL LOW (ref 12.0–15.0)
MCH: 25.5 pg — ABNORMAL LOW (ref 26.0–34.0)
MCHC: 31.9 g/dL (ref 30.0–36.0)

## 2011-10-24 LAB — CARDIAC PANEL(CRET KIN+CKTOT+MB+TROPI): Relative Index: INVALID (ref 0.0–2.5)

## 2011-10-24 MED ORDER — ASPIRIN 81 MG PO TBEC: 81.0000 mg | DELAYED_RELEASE_TABLET | Freq: Every day | ORAL | Status: DC

## 2011-10-24 MED ORDER — METFORMIN HCL 500 MG PO TABS: 500.0000 mg | ORAL_TABLET | Freq: Two times a day (BID) | ORAL | Status: DC

## 2011-10-24 MED ORDER — SIMVASTATIN 20 MG PO TABS: 20.0000 mg | ORAL_TABLET | Freq: Every evening | ORAL | Status: DC

## 2011-10-24 MED ORDER — FERROUS SULFATE 325 (65 FE) MG PO TABS: 325.0000 mg | ORAL_TABLET | Freq: Three times a day (TID) | ORAL | Status: DC

## 2011-10-24 NOTE — ED Provider Notes (Signed)
I saw and evaluated the patient, reviewed the resident's note and I agree with the findings and plan, ekg interpretation (reviewed by me)    Forbes Cellar, MD 10/24/11 [phone]

## 2011-10-24 NOTE — Progress Notes (Signed)
Pt having ventricular bigeminy on telemetry monitor while awake; asymptomatic. Strip obtained and posted to chart.

## 2011-10-24 NOTE — H&P (Signed)
On-Call Internal Medicine Attending Admission Note Date: 10/24/2011  Patient name: Kaylee Murphy Medical record number: [id number] Date of birth: [birth year] Age: 51 y.o. Gender: female  I saw and evaluated the patient. I reviewed the resident's note and I agree with the resident's findings and plan as documented in the resident's note, with additional comments as noted below.  Chief Complaint(s):  Substernal chest pain  History - key components related to admission: Patient is a 51 year old woman with a history of diabetes mellitus, hypertension, hyperlipidemia, smoking, chronic shoulder pain, and depression admitted with a complaint of substernal chest pain that began while she was visiting her grandson here at [facility] and which persisted for over one hour, was aggravated by exertion, and was accompanied by some shortness of breath.  Her pain improved after 2 doses of morphine in the emergency department.     Physical Exam - key components related to admission:  Filed Vitals:   10/23/11 1849 10/23/11 2001 10/23/11 2217 10/24/11 0500  BP: 123/76 115/83 164/92 131/78  Pulse: 81 81 70 78  Temp:   97.9 F (36.6 C) 98.2 F (36.8 C)  TempSrc:   Oral Oral  Resp: 24 18 18 18   Height:   5\' 5"  (1.651 m)   Weight:   148 lb 13 oz (67.5 kg)   SpO2: 100% 100% 100% 100%    Gen.: Alert, no distress Lungs: Clear Heart: Regular; S1-S2; 1/6 systolic ejection murmur at the left sternal border; no S3, no S4 Abdomen: Bowel sounds present, soft, nontender Extremities: No edema  Lab results:   Basic Metabolic Panel:  Basename 10/24/11 0611 10/23/11 1820  NA 139 137  K 4.0 3.4*  CL 107 103  CO2 21 23  GLUCOSE 136* 96  BUN 10 10  CREATININE 0.61 0.67  CALCIUM 8.9 9.8  MG -- --  PHOS -- --   Liver Function Tests:  [facility] 10/24/11 0611  AST 12  ALT 9  ALKPHOS 75  BILITOT 0.2*  PROT 7.1  ALBUMIN 3.1*    CBC:  Basename 10/24/11 0611 10/23/11 1820  WBC 11.6* 12.3*    NEUTROABS -- --  HGB 9.9* 10.0*  HCT 31.0* 31.1*  MCV 79.9 78.9  PLT 387 407*    Cardiac Enzymes:  Basename 10/24/11 0611 10/23/11 2209 10/23/11 1840  CKTOTAL 95 99 --  CKMB 1.7 1.7 --  CKMBINDEX -- -- --  TROPONINI <0.30 <0.30 <0.30    CBG:  Basename 10/24/11 0748 10/23/11 2214 10/23/11 1841  GLUCAP 149* 133* 105*     Imaging results:  Dg Chest 2 View  10/23/2011  *RADIOLOGY REPORT*  Clinical Data: Shortness of breath.  Hypertension.  Diabetes. Chest pain.  CHEST - 2 VIEW  Comparison: 12/21/2010  Findings: The cardiomediastinal silhouette is within normal limits. Lungs are free of focal consolidations and pleural effusions. Visualized osseous structures have a normal appearance.  The patient has had prior right shoulder ligamentous repair.  IMPRESSION: No evidence for acute cardiopulmonary abnormality.  Original Report Authenticated By: Patterson Hammersmith, M.D.    Other results:  EKG 2/9: Normal sinus rhythm; septal infarct , age undetermined  EKG 2/9: SINUS RHYTHM ~ normal P axis, V-rate 50- 99 VENTRICULAR BIGEMINY ~ bigeminy string>4 w/ V complexes BORDERLINE LOW VOLTAGE IN FRONTAL LEADS ~ all frontal leads <0.60mV CONSIDER ANTEROSEPTAL INFARCT ~ Q >3mS, V1 V2  EKG 2/10: Sinus rhythm with frequent premature ventricular complexes in a pattern of bigeminy Low voltage QRS Nonspecific T wave abnormality  Assessment & Plan by Problem:  1.  Episode of prolonged substernal chest pain.  Cardiac enzymes are negative; EKG shows ventricular bigeminy but no acute ST/T changes.  Given her multiple risk factors for coronary artery disease, and the nature of her symptoms, further evaluation to exclude obstructive coronary artery disease is warranted.  Plan is monitor; check a d-dimer; aspirin as tolerated given history of GI intolerance; 2-D echocardiogram; cardiology consult.  2.  Hypertension.  Continue lisinopril, monitor blood pressure.  3.  Hyperlipidemia.  Would target  LDL of less than 70, and a statin would be the best option.  4.  Iron deficiency anemia.  Plan is oral iron replacement with ferrous sulfate; would check fecal occult blood, and even if negative an outpatient screening colonoscopy would be appropriate.  Also with history of uterine fibroid, ultrasound to evaluation would be appropriate.  5.  Attending physician.  Dr. Drue Second will return tomorrow 10/25/2011     Active Problems:  DM  HYPERLIPIDEMIA  Essential hypertension, benign  Chest pain  Iron deficiency anemia

## 2011-10-24 NOTE — Consults (Addendum)
Admit date: 10/23/2011 Referring Physician  Dr. Eben Burow Primary Physician Lars Mage, MD, MD Primary Cardiologist  None Reason for Consultation  Chest pain  HPI: 51 year old with DM, HTN, HL, you yesterday admitted with mid sternal chest pain that lasted about 90 min with no radiation. No SOB, no nausea. Pressure like. She was walking from parking lot to hospital to see her grandson who was sick and after sitting for 10 min the pain began. She does not have exertional pain at home. 5/10 intensity. She wonders if it was secondary to stress worried about grandchild. 2 years ago went to ER with CP and was discharged.  No prior MI. No CVA, no bleeding, rash, joint pain, dysphagia.   PMH:   Past Medical History  Diagnosis Date  . Diabetes mellitus   . Hypertension   . Hyperlipidemia   . Chronic shoulder pain 10 years    s/p multiple b/l rotator cuff repair sx - per MRI 04/2010 -  Delamination of the infraspinatus with mild fatty atrophysuggest distal tendon tear with medial propagation. Depending onthe hardware, CT arthrography may add information for evaluation othe rotator cuff.f   . Depression     PSH:   Past Surgical History  Procedure Date  . Rotator cuff repair     Repair 2004, 2005 and 2007  . Rotator cuff repair 2004  . Rotator cuff repair     x3   Allergies:  Cephalexin; Aspirin; Codeine; Keflex; and Toradol Prior to Admit Meds:   Prescriptions prior to admission  Medication Sig Dispense Refill  . ibuprofen (ADVIL,MOTRIN) 200 MG tablet Take 400 mg by mouth every 6 (six) hours as needed. For pain       . lisinopril (PRINIVIL,ZESTRIL) 10 MG tablet Take 10 mg by mouth daily.      Marland Kitchen oxyCODONE-acetaminophen (PERCOCET) 7.5-500 MG per tablet Take 1 tablet by mouth 2 (two) times daily as needed. For pain       . traMADol (ULTRAM) 50 MG tablet Take 100 mg by mouth 2 (two) times daily.      Marland Kitchen oxyCODONE-acetaminophen (PERCOCET) 5-325 MG per tablet Take 1-2 tablets by mouth every 6 (six)  hours as needed. For pain.       Fam HX:    Family History  Problem Relation Age of Onset  . Cancer Mother   . Stroke Father    Social HX:    History   Social History  . Marital Status: Single    Spouse Name: N/A    Number of Children: N/A  . Years of Education: N/A   Occupational History  . on disability   . 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 Smoking status: Current Everyday Smoker -- 0.2 packs/day    Types: Cigarettes  . Smokeless tobacco: Not on file   Comment: 5-6 cigs/day  . Alcohol Use: No  . Drug Use: No  . Sexually Active: Not on file     single   Other Topics Concern  . Not on file   Social History Narrative   Pt retired Lawyer. Recently moved to [place] from [place] in 2011.     ROS:  All 11 ROS were addressed and are negative except what is stated in the HPI  Physical Exam: Blood pressure 131/78, pulse 78, temperature 98.2 F (36.8 C), temperature source Oral, resp. rate 18, height 5\' 5"  (1.651 m), weight 67.5 kg (148 lb 13 oz), last menstrual period 09/22/2011,  SpO2 100.00%.    General: Well developed, well nourished, in no acute distress Head: Eyes PERRLA, No xanthomas.   Normal cephalic and atramatic  Lungs:  Clear bilaterally to auscultation and percussion. Normal respiratory effort. No wheezes, no rales. Heart:   HRRR S1 S2 Pulses are 2+ & equal.            No carotid bruit. No JVD.  No abdominal bruits. No femoral bruits. Abdomen: Bowel sounds are positive, abdomen soft and non-tender without masses. No hepatosplenomegaly. Msk:  Back normal, normal gait. Normal strength and tone for age. Extremities:   No clubbing, cyanosis or edema.  DP +1 Neuro: Alert and oriented X 3, non-focal, MAE x 4 GU: Deferred Rectal: Deferred Psych:  Good affect, responds appropriately    Labs:   Lab Results  Component Value Date   WBC 11.6* 10/24/2011   HGB 9.9* 10/24/2011   HCT 31.0* 10/24/2011   MCV 79.9 10/24/2011    PLT 387 10/24/2011    Lab 10/24/11 0611  NA 139  K 4.0  CL 107  CO2 21  BUN 10  CREATININE 0.61  CALCIUM 8.9  PROT 7.1  BILITOT 0.2*  ALKPHOS 75  ALT 9  AST 12  GLUCOSE 136*   No results found for this basename: PTT   No results found for this basename: INR, PROTIME   Lab Results  Component Value Date   CKTOTAL 95 10/24/2011   CKMB 1.7 10/24/2011   TROPONINI <0.30 10/24/2011     Lab Results  Component Value Date   CHOL 159 10/20/2011   CHOL 182 03/17/2010   CHOL 218* 07/01/2009   Lab Results  Component Value Date   HDL 36* 10/20/2011   HDL 35* 03/17/2010   HDL 35* 07/01/2009   Lab Results  Component Value Date   LDLCALC 95 10/20/2011   LDLCALC 113* 03/17/2010   LDLCALC 139* 07/01/2009   Lab Results  Component Value Date   TRIG 140 10/20/2011   TRIG 168* 03/17/2010   TRIG 220* 07/01/2009   Lab Results  Component Value Date   CHOLHDL 4.4 10/20/2011   CHOLHDL 5.2 Ratio 03/17/2010   CHOLHDL 6.2 Ratio 07/01/2009   No results found for this basename: LDLDIRECT      Radiology:  Dg Chest 2 View  10/23/2011  *RADIOLOGY REPORT*  Clinical Data: Shortness of breath.  Hypertension.  Diabetes. Chest pain.  CHEST - 2 VIEW  Comparison: 12/21/2010  Findings: The cardiomediastinal silhouette is within normal limits. Lungs are free of focal consolidations and pleural effusions. Visualized osseous structures have a normal appearance.  The patient has had prior right shoulder ligamentous repair.  IMPRESSION: No evidence for acute cardiopulmonary abnormality.  Original Report Authenticated By: Patterson Hammersmith, M.D.   Personally viewed.  EKG:  SR, PVC's (tele frequent earlier, now subsided), no ST changes Personally viewed.   ASSESSMENT/PLAN:   51 year old with DM, HTN, HL, with chest pain.  -Chest pain - now resolved. With her multiple cardiac risk factors, I will set up for stress test as outpt at the clinic. She and Dr. Eben Burow are aware. I am OK for DC. If symptoms worsen she knows to  return to ER.  ?MSK or GERD also as etiology. Must exclude cardiac with RF's. Would like her to be on ASA 81.   -HL - would advocate statin, simvastatin, with DM  -PVC's - asymptomatic. May consider low dose Bb as outpt. For now no further tx.   -HTN -  reasonable cont. ACE-I. Consider amlodipine in future if needed.  -DM - A1c 7 range.   -Mild leukocytosis - CXR OK - per primary team  Will arrange stress test.   Donato Schultz, MD  10/24/2011  9:45 AM

## 2011-10-24 NOTE — Discharge Summary (Signed)
Internal Medicine Teaching [facility] Discharge Note  Name: Kaylee Murphy MRN: [id number] DOB: 04-Apr-1961 51 y.o.  Date of Admission: 10/23/2011  5:54 PM Date of Discharge: 10/24/2011 Attending Physician: Farley Ly, MD  Discharge Diagnosis: Chest Pain NOS Hypertension Hyperlipidemia Anemia DMII Depression  Discharge Medications: Medication List  As of 10/24/2011 12:03 PM   STOP taking these medications         ibuprofen 200 MG tablet         TAKE these medications         aspirin 81 MG EC tablet   Take 1 tablet (81 mg total) by mouth daily. Swallow whole.      ferrous sulfate 325 (65 FE) MG tablet   Take 1 tablet (325 mg total) by mouth 3 (three) times daily with meals.      lisinopril 10 MG tablet   Commonly known as: PRINIVIL,ZESTRIL   Take 10 mg by mouth daily.      metFORMIN 500 MG tablet   Commonly known as: GLUCOPHAGE   Take 1 tablet (500 mg total) by mouth 2 (two) times daily with a meal.                  oxyCODONE-acetaminophen 5-325 MG per tablet   Commonly known as: PERCOCET   Take 1-2 tablets by mouth every 6 (six) hours as needed. For pain.      simvastatin 20 MG tablet   Commonly known as: ZOCOR   Take 1 tablet (20 mg total) by mouth every evening.      traMADol 50 MG tablet   Commonly known as: ULTRAM   Take 100 mg by mouth 2 (two) times daily.            Disposition and follow-up:   Kaylee Murphy was discharged from [facility] in Stable condition.    Follow-up Appointments: Follow-up Information    Follow up with Donato Schultz, MD. Call in 1 day. (For your stress test)    Contact information:   [address] [place] [address] [phone]       Follow up with Lars Mage, MD in 2 weeks. (If symptoms worsen)    Contact information:   [address] [place] [address] [phone]         Discharge Orders    Future Orders Please Complete By Expires    Diet - low sodium heart healthy      Increase activity slowly       Consultations: Cardiology - Dr. Anne Fu  Procedures Performed:  Dg Chest 2 View  10/23/2011  *RADIOLOGY REPORT*  Clinical Data: Shortness of breath.  Hypertension.  Diabetes. Chest pain.  CHEST - 2 VIEW  Comparison: 12/21/2010  Findings: The cardiomediastinal silhouette is within normal limits. Lungs are free of focal consolidations and pleural effusions. Visualized osseous structures have a normal appearance.  The patient has had prior right shoulder ligamentous repair.  IMPRESSION: No evidence for acute cardiopulmonary abnormality.  Original Report Authenticated By: Patterson Hammersmith, M.D.   Admission HPI: Patient is 51 yo lady with PMH of HTN, DM-II (A1c 6.9 on 10/20/11), HLD (LDL 95 on 2/6/130 and Depression, who present with chest pain. Per patient, her chest pan started 3 hours ago prior to coming to the ED, when paint was sitting in the chair. The pain is pressure-like, located at substernal area, 7/10 in severity, radiating to her back (the pain goes through her chest directly to her  back per patient). The pain is constant. She tried percocet and ASA without help. It is associated with mild SOB, but not with palpitation, cough, running nose or sore throat. Her chest pain is aggregated by walking. Deep breath makes her feel suffocated, but does not affect her chest pain.  Denies fever, chills, headaches, abdominal pain,diarrhea, constipation, dysuria, urgency, frequency, hematuria, joint pain or leg swelling. No long distant traveling history. No pain in he calf area.   Hospital Course by problem list:  1. Chest Pain: Her initial presentation was concerning for cardiac ischemia. However, her symptoms quickly resolved after admission. Patient's ECG did not show any signs of ischemia, and her cardiac enzymes have been negative x3. She was seen by Dr. Anne Fu who is comfortable with outpatient stress test, and risk factor  modification. He recommends ASA and Simvastatin. Spoke with the patient about her aspirin sensitivity. She says that many years ago she had nausea and stomach pain with aspirin. We recommended that she take a daily aspirin with meals. Patient has a negative d-dimer making pulmonary embolism less likely. ECG shows occasional PVCs and ventricular bigeminy, though she is currently asymptomatic with these. TSH on 10/20/11 was normal at 1.974. Other potential etiologies include stress from her grandchild's hospitalization, GERD, and MSK pain.  -- Began ASA 81 po daily.  -- Stress testing with Dr. Anne Fu as outpatient.  -- PCP: Consider low dose beta-blocker as an outpatient for PVCs.   2. Hypertension: Patient's blood pressure has ranged between 115/83-164/92. However, by the morning her blood pressure was measured at 131/78. We will continue her outpatient Lisinopril 10mg  po daily.   3.Hyperlipidemia: Patient's last measured LDL on 10/20/11 was 95. However, will begin statin therapy given her risk factors and the pleomorphic benefits of statin therapy. -- Began Simvastatin 20mg  po daily  -- PCP: Monitor LFTs as outpatient.   4.Iron Deficiency Anemia: Patient has a history of iron-deficiency anemia treated with oral iron therapy. Her iron studies on 10/20/11 show iron 24, TIBC 410, %sat 6, Ferritin 22. The cause of her iron deficiency is unknown. Will continue to maintain ferrous sulfate 325 po tid.  -- PCP: Continue to monitor CBC.  -- PCP: Consider colonoscopy and transvagina US to evaluate source of iron deficiency anemia.   5.Diabetes Mellitus type II: Patient's A1C on 10/19/10 was 6.9. Will continue outpatient Metformin 500mg  po bid.   6.Depression: Patient does not endorse and current depression. She is not currently on any antidepressants. Will continue to monitor as an outpatient.    Discharge Vitals:  BP 136/89  Pulse 78  Temp(Src) 98.2 F (36.8 C) (Oral)  Resp 18  Ht 5\' 5"  (1.651 m)  Wt 67.5  kg (148 lb 13 oz)  BMI 24.76 kg/m2  SpO2 100%  LMP 09/22/2011  Discharge Labs:  Results for orders placed during the hospital encounter of 10/23/11 (from the past 24 hour(s))  CBC     Status: Abnormal   Collection Time   10/23/11  6:20 PM      Component Value Range   WBC 12.3 (*) 4.0 - 10.5 (K/uL)   RBC 3.94  3.87 - 5.11 (MIL/uL)   Hemoglobin 10.0 (*) 12.0 - 15.0 (g/dL)   HCT 34.7 (*) 42.5 - 46.0 (%)   MCV 78.9  78.0 - 100.0 (fL)   MCH 25.4 (*) 26.0 - 34.0 (pg)   MCHC 32.2  30.0 - 36.0 (g/dL)   RDW 95.6 (*) 38.7 - 15.5 (%)   Platelets 407 (*)  150 - 400 (K/uL)  BASIC METABOLIC PANEL     Status: Abnormal   Collection Time   10/23/11  6:20 PM      Component Value Range   Sodium 137  135 - 145 (mEq/L)   Potassium 3.4 (*) 3.5 - 5.1 (mEq/L)   Chloride 103  96 - 112 (mEq/L)   CO2 23  19 - 32 (mEq/L)   Glucose, Bld 96  70 - 99 (mg/dL)   BUN 10  6 - 23 (mg/dL)   Creatinine, Ser 1.61  0.50 - 1.10 (mg/dL)   Calcium 9.8  8.4 - 09.6 (mg/dL)   GFR calc non Af Amer >90  >90 (mL/min)   GFR calc Af Amer >90  >90 (mL/min)  TROPONIN I     Status: Normal   Collection Time   10/23/11  6:40 PM      Component Value Range   Troponin I <0.30  <0.30 (ng/mL)  GLUCOSE, CAPILLARY     Status: Abnormal   Collection Time   10/23/11  6:41 PM      Component Value Range   Glucose-Capillary 105 (*) 70 - 99 (mg/dL)   Comment 1 Documented in Chart     Comment 2 Notify RN    CARDIAC PANEL(CRET KIN+CKTOT+MB+TROPI)     Status: Normal   Collection Time   10/23/11 10:09 PM      Component Value Range   Total CK 99  7 - 177 (U/L)   CK, MB 1.7  0.3 - 4.0 (ng/mL)   Troponin I <0.30  <0.30 (ng/mL)   Relative Index RELATIVE INDEX IS INVALID  0.0 - 2.5   GLUCOSE, CAPILLARY     Status: Abnormal   Collection Time   10/23/11 10:14 PM      Component Value Range   Glucose-Capillary 133 (*) 70 - 99 (mg/dL)  URINE RAPID DRUG SCREEN (HOSP PERFORMED)     Status: Abnormal   Collection Time   10/23/11 11:50 PM      Component  Value Range   Opiates POSITIVE (*) NONE DETECTED    Cocaine NONE DETECTED  NONE DETECTED    Benzodiazepines NONE DETECTED  NONE DETECTED    Amphetamines NONE DETECTED  NONE DETECTED    Tetrahydrocannabinol NONE DETECTED  NONE DETECTED    Barbiturates NONE DETECTED  NONE DETECTED   CBC     Status: Abnormal   Collection Time   10/24/11  6:11 AM      Component Value Range   WBC 11.6 (*) 4.0 - 10.5 (K/uL)   RBC 3.88  3.87 - 5.11 (MIL/uL)   Hemoglobin 9.9 (*) 12.0 - 15.0 (g/dL)   HCT 04.5 (*) 40.9 - 46.0 (%)   MCV 79.9  78.0 - 100.0 (fL)   MCH 25.5 (*) 26.0 - 34.0 (pg)   MCHC 31.9  30.0 - 36.0 (g/dL)   RDW 81.1 (*) 91.4 - 15.5 (%)   Platelets 387  150 - 400 (K/uL)  COMPREHENSIVE METABOLIC PANEL     Status: Abnormal   Collection Time   10/24/11  6:11 AM      Component Value Range   Sodium 139  135 - 145 (mEq/L)   Potassium 4.0  3.5 - 5.1 (mEq/L)   Chloride 107  96 - 112 (mEq/L)   CO2 21  19 - 32 (mEq/L)   Glucose, Bld 136 (*) 70 - 99 (mg/dL)   BUN 10  6 - 23 (mg/dL)   Creatinine, Ser 7.82  0.50 -  1.10 (mg/dL)   Calcium 8.9  8.4 - 47.8 (mg/dL)   Total Protein 7.1  6.0 - 8.3 (g/dL)   Albumin 3.1 (*) 3.5 - 5.2 (g/dL)   AST 12  0 - 37 (U/L)   ALT 9  0 - 35 (U/L)   Alkaline Phosphatase 75  39 - 117 (U/L)   Total Bilirubin 0.2 (*) 0.3 - 1.2 (mg/dL)   GFR calc non Af Amer >90  >90 (mL/min)   GFR calc Af Amer >90  >90 (mL/min)  CARDIAC PANEL(CRET KIN+CKTOT+MB+TROPI)     Status: Normal   Collection Time   10/24/11  6:11 AM      Component Value Range   Total CK 95  7 - 177 (U/L)   CK, MB 1.7  0.3 - 4.0 (ng/mL)   Troponin I <0.30  <0.30 (ng/mL)   Relative Index RELATIVE INDEX IS INVALID  0.0 - 2.5   GLUCOSE, CAPILLARY     Status: Abnormal   Collection Time   10/24/11  7:48 AM      Component Value Range   Glucose-Capillary 149 (*) 70 - 99 (mg/dL)   Comment 1 Documented in Chart     Comment 2 Notify RN    D-DIMER, QUANTITATIVE     Status: Normal   Collection Time   10/24/11  9:25 AM        Component Value Range   D-Dimer, Quant 0.37  0.00 - 0.48 (ug/mL-FEU)  GLUCOSE, CAPILLARY     Status: Abnormal   Collection Time   10/24/11 11:32 AM      Component Value Range   Glucose-Capillary 150 (*) 70 - 99 (mg/dL)   Comment 1 Documented in Chart     Comment 2 Notify RN      Lars Mage MD [facility] Internal Medicine Resident Pager [phone] 7:31 AM

## 2011-10-24 NOTE — Progress Notes (Signed)
Subjective:  Kaylee Murphy is a 51 yo F, with a PMH significant for HTN, DM2 (A1c 6.9 on 10/20/11), HLD (LDL 95 on 2/6/130 who presented overnight with chest pain. Overnight, she was given Aspirin 325, Morphine, and Nitrostat. Since, her symptoms have dramatically improved. She denies any shortness of breath or chest pain. The patient was able to walk around the room and floor without symptoms. She denies any headache, fevers, chills, nausea, vomiting, leg pain, leg swelling, diaphoresis, or paresthesias. She is anxious about her grandchild who is in the hospital for vomiting.  Objective: Vital signs in last 24 hours: Filed Vitals:   10/23/11 1849 10/23/11 2001 10/23/11 2217 10/24/11 0500  BP: 123/76 115/83 164/92 131/78  Pulse: 81 81 70 78  Temp:   97.9 F (36.6 C) 98.2 F (36.8 C)  TempSrc:   Oral Oral  Resp: 24 18 18 18   Height:   5\' 5"  (1.651 m)   Weight:   67.5 kg (148 lb 13 oz)   SpO2: 100% 100% 100% 100%   Physical Exam: General: No acute distress. Pleasant.  Head: Normocephalic, atraumatic. Eyes: Sclera have normal palor, are non-injected, and anicteric.  Mouth: Moist mucus membranes.  Pulm: Normal work of breathing. Breath sounds heard bilaterally. Clear to auscultation bilaterally. No wheezing, rales, or rhonchii.  Cardiac: Regular rate and rhythm. No murmurs rubs or gallops. No JVD at 90 degrees.  Abd: Soft and non-distended. Normoactive bowel sounds. Non-tender to palpations.  Ext: No cyanosis or clubbing. Capillary refill <2s. 2+ radial and pedal pulses bilaterally.  Neuro: CN 2-12 grossly intact. No motor weakness Psych: Normal range of affect. Appropriate.   Lab Results: Na 139, K 4.0, Cl 107, HCO 21, BUN 10, Cr 0.61, Glu 136,  Alk Phos 75, Albumin 3.1, AST 12, ALT 9, Total protein 7.1, Tbili 0.2 Troponin I <0.3 three times WBC 11.6, HGB 9.9, HCT 31.0, PLTs 387, MCV 79.9 D-dimer: 0.37 (nl < 0.48)  Studies/Results: Dg Chest 2 View  10/23/2011  *RADIOLOGY REPORT*   Clinical Data: Shortness of breath.  Hypertension.  Diabetes. Chest pain.  CHEST - 2 VIEW  Comparison: 12/21/2010  Findings: The cardiomediastinal silhouette is within normal limits. Lungs are free of focal consolidations and pleural effusions. Visualized osseous structures have a normal appearance.  The patient has had prior right shoulder ligamentous repair.  IMPRESSION: No evidence for acute cardiopulmonary abnormality.  Original Report Authenticated By: Patterson Hammersmith, M.D.   EKG 12-Lead (10/23/2011):on first EKG, regular sinus rhythm, normal axis, norma R wave progression. T wave flattening on inferior and precordial leads. Low voltage on inferior leads. On the repeated EKG, there is ventricular bigeminy.   Medications: I have reviewed the patient's current medications. Scheduled Meds:   . sodium chloride   Intravenous Once  . aspirin  325 mg Oral Once  . enoxaparin  40 mg Subcutaneous Q24H  . ferrous sulfate  325 mg Oral TID WC  . insulin aspart  0-9 Units Subcutaneous Q4H  . lisinopril  10 mg Oral Daily  .  morphine injection  4 mg Intravenous Once  .  morphine injection  6 mg Intravenous Once  . ondansetron (ZOFRAN) IV  4 mg Intravenous Once  . pantoprazole  40 mg Oral Q1200  . potassium chloride  40 mEq Oral Once   Continuous Infusions:   . sodium chloride 125 mL/hr at 10/24/11 0639   PRN Meds:.acetaminophen, acetaminophen, morphine, nitroGLYCERIN, ondansetron (ZOFRAN) IV, ondansetron Assessment/Plan: Chest Pain: Her initial presentation was concerning for  cardiac ischemia. However, her symptoms quickly resolved after admission. Patient's ECG did not show any signs of ischemia, and her cardiac enzymes have been negative x3. She was seen by Dr. Anne Fu who is comfortable with outpatient stress test, and risk factor modification, to include ASA and Simvastatin. Spoke with the patient about her aspirin sensitivity. She says that many years ago she had nausea and stomach pain with  aspirin. We recommended that she take a daily aspirin with meals. Patient has a negative d-dimer making pulmonary embolism less likely. ECG shows occasional PVCs and ventricular bigeminy, though she is currently asymptomatic with these. TSH on 10/20/11 was normal at 1.974. Other potential etiologies include stress from her grandchild's hospitalization, GERD, and MSK pain.  -- Start ASA 81 po daily.  -- Stress testing with Dr. Anne Fu as outpatient.  -- Consider low dose beta-blocker as an outpatient for PVCs.   Hypertension: Patient's blood pressure has ranged between 115/83-164/92. However, by the morning her blood pressure was measured at 131/78. We will continue her outpatient Lisinopril 10mg  po daily.  Hyperlipidemia: Patient's last measured LDL on 10/20/11 was 95. However, given her risk factors and presentation she would likely benefit from stating therapy.  -- Begin Simvastatin 10mg  po daily -- Monitor LFTs as outpatient.   Iron Deficiency Anemia: Patient has a history of iron-deficiency anemia treated with oral iron therapy. Her iron studies on 10/20/11 show iron 24, TIBC 410, %sat 6, Ferritin 22. The cause of her iron deficiency is unknown. Will continue to maintain ferrous sulfate therapy inpatient and on discharge.  -- Continue to monitor CBC.  -- Consider colonoscopy and transvagina US to evaluate source of iron deficiency anemia.   Diabetes Mellitus type II: Patient's A1C on 10/19/10 was 6.9. Will continue to monitor as an outpatient.   Depression: Patient does not endorse and current depression. She is not currently on any antidepressants.   Dispo: Home Today   LOS: 1 day   Wilmer Floor 10/24/2011, 9:52 AM   Addendum: Patient was seen,evaluated and examined with MS4.  Patient did not complain of any ongoing chest pain at this time and feels much better.   BP 131/78  Pulse 78  Temp(Src) 98.2 F (36.8 C) (Oral)  Resp 18  Ht 5\' 5"  (1.651 m)  Wt 148 lb 13 oz (67.5 kg)  BMI  24.76 kg/m2  SpO2 100%  LMP 09/22/2011  BP 131/78  Pulse 78  Temp(Src) 98.2 F (36.8 C) (Oral)  Resp 18  Ht 5\' 5"  (1.651 m)  Wt 148 lb 13 oz (67.5 kg)  BMI 24.76 kg/m2  SpO2 100%  LMP 09/22/2011  General Appearance:    Alert, cooperative, no distress, appears stated age  Head:    Normocephalic, without obvious abnormality, atraumatic  Eyes:    PERRL, conjunctiva/corneas clear, EOM's intact, fundi    benign, both eyes       Ears:    Normal TM's and external ear canals, both ears  Nose:   Nares normal, septum midline, mucosa normal, no drainage   or sinus tenderness  Throat:   Lips, mucosa, and tongue normal; teeth and gums normal  Neck:   Supple, symmetrical, trachea midline, no adenopathy;       thyroid:  No enlargement/tenderness/nodules; no carotid   bruit or JVD  Back:     Symmetric, no curvature, ROM normal, no CVA tenderness  Lungs:     Clear to auscultation bilaterally, respirations unlabored  Chest wall:    No tenderness or  deformity  Heart:    Regular rate and rhythm, S1 and S2 normal, no murmur, rub   or gallop  Abdomen:     Soft, non-tender, bowel sounds active all four quadrants,    no masses, no organomegaly        Extremities:   Extremities normal, atraumatic, no cyanosis or edema  Pulses:   2+ and symmetric all extremities  Skin:   Skin color, texture, turgor normal, no rashes or lesions  Lymph nodes:   Cervical, supraclavicular, and axillary nodes normal  Neurologic:   CNII-XII intact. Normal strength, sensation and reflexes      throughout    All the labs, imaging studies an EKG were reviewed and they were unremarkable for anemia as discussed.  Assessment and plan: #1 atypical chest pain: Given the risk factors of diabetes, hypertension and smoking he would focus on risk factor stratification as outpatient. Patient will need a stress test. Aspirin and statin along with blood pressure control and diabetes management.  #2 hypertension patient's blood  pressure was 131/78 this morning will continue her on her home dose at this time.  #3 hyperlipidemia: LDL is 95 patient would likely benefit from statin therapy for its plieotropic to be appropriate affect.  #4 iron deficiency anemia: We'll continue repleating ferrous sulfate as outpatient.  #5 diabetes patient hba1c is 6.9. Continue to monitor outpatient  #6 depression patient is currently not on any antidepressants  Disposition: Patient will be discharged home today with outpatient appointment with Abagail Kitchens her new cardiologist who will eventually do a stress test on her.

## 2011-10-27 ENCOUNTER — Other Ambulatory Visit: Payer: Self-pay | Admitting: Internal Medicine

## 2011-11-12 ENCOUNTER — Encounter: Payer: Self-pay | Admitting: Internal Medicine

## 2011-11-12 ENCOUNTER — Ambulatory Visit (INDEPENDENT_AMBULATORY_CARE_PROVIDER_SITE_OTHER): Payer: Medicare Other | Admitting: Internal Medicine

## 2011-11-12 VITALS — BP 134/74 | HR 73 | Temp 98.6°F | Wt 150.9 lb

## 2011-11-12 DIAGNOSIS — D509 Iron deficiency anemia, unspecified: Secondary | ICD-10-CM

## 2011-11-12 DIAGNOSIS — Z0000 Encounter for general adult medical examination without abnormal findings: Secondary | ICD-10-CM

## 2011-11-12 DIAGNOSIS — R109 Unspecified abdominal pain: Secondary | ICD-10-CM | POA: Insufficient documentation

## 2011-11-12 DIAGNOSIS — Z1211 Encounter for screening for malignant neoplasm of colon: Secondary | ICD-10-CM

## 2011-11-12 DIAGNOSIS — F411 Generalized anxiety disorder: Secondary | ICD-10-CM

## 2011-11-12 DIAGNOSIS — F419 Anxiety disorder, unspecified: Secondary | ICD-10-CM

## 2011-11-12 DIAGNOSIS — R079 Chest pain, unspecified: Secondary | ICD-10-CM

## 2011-11-12 MED ORDER — FLUOXETINE HCL 20 MG PO CAPS: 20.0000 mg | ORAL_CAPSULE | Freq: Every day | ORAL | Status: DC

## 2011-11-12 NOTE — Progress Notes (Signed)
  Subjective:    Patient ID: Kaylee Murphy, female    DOB: 1960/10/22, 51 y.o.   MRN: [id number]  HPI Kaylee Murphy is a 51 year old female who was recently admitted with chest pain. She denies any chest pain or shortness of breath. Walks with her dog everyday 2-3 miles without having any chest pain or shortness of breath.  She complains of anxiety. She has had decreased appetite and  weight loss in last 2 weeks. Says she may be depressed.  Abdominal pain 3/10 in left lower quadrant, once every 2-3 weeks when period is due. LMP 09/22/11. No relation with food or BM. Non radiating.   No other complaints.   Review of Systems  Constitutional: Negative for fever, activity change and appetite change.  HENT: Negative for sore throat.   Respiratory: Negative for cough and shortness of breath.   Cardiovascular: Negative for chest pain and leg swelling.  Gastrointestinal: Negative for nausea, abdominal pain, diarrhea, constipation and abdominal distention.  Genitourinary: Negative for frequency, hematuria and difficulty urinating.  Neurological: Negative for dizziness and headaches.  Psychiatric/Behavioral: Negative for suicidal ideas and behavioral problems.       Objective:   Physical Exam  Constitutional: She is oriented to person, place, and time. She appears well-developed and well-nourished.  HENT:  Head: Normocephalic and atraumatic.  Eyes: Conjunctivae and EOM are normal. Pupils are equal, round, and reactive to light. No scleral icterus.  Neck: Normal range of motion. Neck supple. No JVD present. No thyromegaly present.  Cardiovascular: Normal rate, regular rhythm, normal heart sounds and intact distal pulses.  Exam reveals no gallop and no friction rub.   No murmur heard. Pulmonary/Chest: Effort normal and breath sounds normal. No respiratory distress. She has no wheezes. She has no rales.  Abdominal: Soft. Bowel sounds are normal. She exhibits no distension and no mass. There is  tenderness. There is no rebound and no guarding.       Right lower quadrant pain on deep palpation.  Musculoskeletal: Normal range of motion. She exhibits no edema and no tenderness.  Lymphadenopathy:    She has no cervical adenopathy.  Neurological: She is alert and oriented to person, place, and time.  Psychiatric: She has a normal mood and affect. Her behavior is normal.          Assessment & Plan:

## 2011-11-12 NOTE — Assessment & Plan Note (Signed)
Check CBC and retic count today.

## 2011-11-12 NOTE — Assessment & Plan Note (Addendum)
She has symptoms consistent with GAD. Patient will most likely benefit from SSRI as they are the first line of therapy in GAD. The choice of a particular SSRI is based upon cost, individual patient tolerance, and clinician experience, because efficacy appears to be comparable. I will start her fluoxetin 20 mg daily and have her come back in 4 weeks for recheck and titrating up the dose. This is based on cost as prozac is $4 drug. The side effects were discussed in detail.

## 2011-11-12 NOTE — Assessment & Plan Note (Addendum)
It seems related to her anxiety. Patient walks everyday with her dog and does not complain of sob or chest pain while walking. I do not think there is any benefit of stress test at this time as the probability is very low <15% at this time.

## 2011-11-12 NOTE — Assessment & Plan Note (Addendum)
The fact that the pain is worse at the time of menstruation makes me suspicious that she has endometriosis. I will send her to Gyn for a complete gynecological evaluation at this time. She also has fibroids diagnosed in 1996 for which she needs a follow up.  I will not check imaging at this time and have the GYN decide upon the choice of imaging based on their clinical suspicion. I have asked her to continue OTC NSAIDS in the meanwhile. RTC in clinic if she has difficulty getting GYN appointment or if she feels worse. Patient voices understanding of the plan.

## 2011-11-12 NOTE — Patient Instructions (Signed)

## 2011-11-12 NOTE — Assessment & Plan Note (Signed)
Due for colonoscopy 

## 2011-11-13 LAB — CBC WITH DIFFERENTIAL/PLATELET
Basophils Absolute: 0.1 10*3/uL (ref 0.0–0.1)
Basophils Relative: 1 % (ref 0–1)
HCT: 31.8 % — ABNORMAL LOW (ref 36.0–46.0)
Lymphocytes Relative: 46 % (ref 12–46)
MCV: 82.8 fL (ref 78.0–100.0)
Monocytes Absolute: 0.7 10*3/uL (ref 0.1–1.0)
Neutro Abs: 5.1 10*3/uL (ref 1.7–7.7)
Neutrophils Relative %: 45 % (ref 43–77)
RBC: 3.84 MIL/uL — ABNORMAL LOW (ref 3.87–5.11)
RDW: 17.2 % — ABNORMAL HIGH (ref 11.5–15.5)
WBC: 11.4 10*3/uL — ABNORMAL HIGH (ref 4.0–10.5)

## 2011-11-13 LAB — RETICULOCYTES
ABS Retic: 57.6 10*3/uL (ref 19.0–186.0)
RBC.: 3.84 MIL/uL — ABNORMAL LOW (ref 3.87–5.11)
Retic Ct Pct: 1.5 % (ref 0.4–2.3)

## 2011-11-14 ENCOUNTER — Emergency Department (HOSPITAL_COMMUNITY): Payer: Medicare Other

## 2011-11-14 ENCOUNTER — Encounter (HOSPITAL_COMMUNITY): Payer: Self-pay | Admitting: *Deleted

## 2011-11-14 ENCOUNTER — Other Ambulatory Visit: Payer: Self-pay

## 2011-11-14 ENCOUNTER — Emergency Department (HOSPITAL_COMMUNITY)
Admission: EM | Admit: 2011-11-14 | Discharge: 2011-11-14 | Disposition: A | Discharge status: Home / Self Care | Payer: Medicare Other | Attending: Emergency Medicine | Admitting: Emergency Medicine

## 2011-11-14 DIAGNOSIS — M79609 Pain in unspecified limb: Secondary | ICD-10-CM | POA: Insufficient documentation

## 2011-11-14 DIAGNOSIS — R079 Chest pain, unspecified: Secondary | ICD-10-CM | POA: Insufficient documentation

## 2011-11-14 DIAGNOSIS — R0602 Shortness of breath: Secondary | ICD-10-CM | POA: Insufficient documentation

## 2011-11-14 DIAGNOSIS — I10 Essential (primary) hypertension: Secondary | ICD-10-CM | POA: Insufficient documentation

## 2011-11-14 DIAGNOSIS — E119 Type 2 diabetes mellitus without complications: Secondary | ICD-10-CM | POA: Insufficient documentation

## 2011-11-14 DIAGNOSIS — Z79899 Other long term (current) drug therapy: Secondary | ICD-10-CM | POA: Insufficient documentation

## 2011-11-14 LAB — COMPREHENSIVE METABOLIC PANEL
AST: 12 U/L (ref 0–37)
Albumin: 3.5 g/dL (ref 3.5–5.2)
Alkaline Phosphatase: 73 U/L (ref 39–117)
BUN: 9 mg/dL (ref 6–23)
CO2: 23 mEq/L (ref 19–32)
Chloride: 101 mEq/L (ref 96–112)
Potassium: 3.5 mEq/L (ref 3.5–5.1)
Total Bilirubin: 0.2 mg/dL — ABNORMAL LOW (ref 0.3–1.2)

## 2011-11-14 LAB — CBC
MCH: 25.5 pg — ABNORMAL LOW (ref 26.0–34.0)
MCV: 78.7 fL (ref 78.0–100.0)
Platelets: 353 10*3/uL (ref 150–400)
RDW: 17.1 % — ABNORMAL HIGH (ref 11.5–15.5)
WBC: 11.7 10*3/uL — ABNORMAL HIGH (ref 4.0–10.5)

## 2011-11-14 MED ORDER — MORPHINE SULFATE 4 MG/ML IJ SOLN
2.0000 mg | Freq: Once | INTRAMUSCULAR | Status: AC
  Administered 2011-11-14: 2 mg via INTRAVENOUS
  Filled 2011-11-14: qty 1

## 2011-11-14 MED ORDER — ASPIRIN 325 MG PO TABS
ORAL_TABLET | ORAL | Status: AC
  Filled 2011-11-14: qty 1

## 2011-11-14 MED ORDER — ONDANSETRON HCL 4 MG/2ML IJ SOLN
4.0000 mg | Freq: Once | INTRAMUSCULAR | Status: AC
  Administered 2011-11-14: 4 mg via INTRAVENOUS
  Filled 2011-11-14: qty 2

## 2011-11-14 NOTE — ED Notes (Signed)
Pt from home with reports of substernal chest pain that goes through to back and down right arm, nausea and shortness of breath. Pt has been seen for similar episodes over the last month and was hospitalized but reports that tests done were negative and is supposed to see a Cardiologist soon but reports that pain became worse about 2 hours ago.

## 2011-11-14 NOTE — ED Notes (Signed)
Pt to xray

## 2011-11-14 NOTE — ED Provider Notes (Signed)
History     CSN: [id number]  Arrival date & time 11/14/11  1405   First MD Initiated Contact with Patient 11/14/11 1502      Chief Complaint  Patient presents with  . Chest Pain  . Nausea  . Shortness of Breath  . Arm Pain    right     HPI The patient presents with 4 hours of chest pain.  The pain was gradual hearing since onset there has been pressure, focally about the sternum, with radiation to the right arm.  The pain is pleuritic, exertional, worse with motion, and with palpation.  No attempts at relief with medications as far.  No dyspnea, no cough, no lightheadedness, no syncope, no emesis.  No extremity dysesthesia or weakness. Notably, the patient was hospitalized 2 months ago following a similar episode.  She had 3 negative troponins, was discharged with outpatient followup.  She saw her cardiologist 2 days ago, and is seen in the midst of scheduling a stress test. Past Medical History  Diagnosis Date  . Diabetes mellitus   . Hypertension   . Hyperlipidemia   . Chronic shoulder pain 10 years    s/p multiple b/l rotator cuff repair sx - per MRI 04/2010 -  Delamination of the infraspinatus with mild fatty atrophysuggest distal tendon tear with medial propagation. Depending onthe hardware, CT arthrography may add information for evaluation othe rotator cuff.f   . Depression     Past Surgical History  Procedure Date  . Rotator cuff repair     Repair 2004, 2005 and 2007  . Rotator cuff repair 2004  . Rotator cuff repair     x3    Family History  Problem Relation Age of Onset  . Cancer Mother   . Stroke Father     History  Substance Use Topics  . Smoking status: Current Everyday Smoker -- 0.2 packs/day    Types: Cigarettes  . Smokeless tobacco: Never Used   Comment: 5-6 cigs/day  . Alcohol Use: No    OB History    Grav Para Term Preterm Abortions TAB SAB Ect Mult Living                  Review of Systems  Constitutional:       HPI  HENT:       HPI  otherwise negative  Eyes: Negative.   Respiratory:       HPI, otherwise negative  Cardiovascular:       HPI, otherwise nmegative  Gastrointestinal: Negative for vomiting.  Genitourinary:       HPI, otherwise negative  Musculoskeletal:       HPI, otherwise negative  Skin: Negative.   Neurological: Negative for syncope.    Allergies  Cephalexin; Codeine; Keflex; and Toradol  Home Medications   Current Outpatient Rx  Name Route Sig Dispense Refill  . ASPIRIN 81 MG PO TBEC Oral Take 1 tablet (81 mg total) by mouth daily. Swallow whole. 30 tablet 12  . FERROUS SULFATE 325 (65 FE) MG PO TABS Oral Take 1 tablet (325 mg total) by mouth 3 (three) times daily with meals. 90 tablet 3  . LISINOPRIL 10 MG PO TABS Oral Take 10 mg by mouth daily.    Marland Kitchen METFORMIN HCL 500 MG PO TABS Oral Take 1 tablet (500 mg total) by mouth 2 (two) times daily with a meal. 60 tablet 5  . OXYCODONE-ACETAMINOPHEN 10-325 MG PO TABS Oral Take 1 tablet by mouth every 4 (four) hours  as needed. pain    . FLUOXETINE HCL 20 MG PO CAPS Oral Take 1 capsule (20 mg total) by mouth daily. 30 capsule 1  . OXYCODONE-ACETAMINOPHEN 5-325 MG PO TABS Oral Take 1-2 tablets by mouth every 6 (six) hours as needed. For pain.    Marland Kitchen SIMVASTATIN 20 MG PO TABS Oral Take 1 tablet (20 mg total) by mouth every evening. 30 tablet 1    BP 131/77  Pulse 70  Resp 21  Wt 150 lb (68.04 kg)  SpO2 100%  LMP 10/23/2011  Physical Exam  Nursing note and vitals reviewed. Constitutional: She is oriented to person, place, and time. She appears well-developed and well-nourished. No distress.  HENT:  Head: Normocephalic and atraumatic.  Eyes: Conjunctivae and EOM are normal.  Cardiovascular: Normal rate and regular rhythm.   Pulmonary/Chest: Effort normal and breath sounds normal. No stridor. No respiratory distress. She exhibits tenderness. She exhibits no laceration, no crepitus, no edema, no deformity, no swelling and no retraction.        Tenderness diffusely across the anterior, superior chest  Abdominal: She exhibits no distension.  Musculoskeletal: She exhibits no edema.  Neurological: She is alert and oriented to person, place, and time. She has normal strength. She displays no tremor. No cranial nerve deficit or sensory deficit. She exhibits normal muscle tone. Coordination normal.  Skin: Skin is warm and dry.  Psychiatric: She has a normal mood and affect.    ED Course  Procedures (including critical care time)   Labs Reviewed  COMPREHENSIVE METABOLIC PANEL  CBC  TROPONIN I  D-DIMER, QUANTITATIVE  LIPASE, BLOOD   No results found.   No diagnosis found.   Cardiac monitor 70, sinus rhythm, normal  Pulse oximetry 100% on 2 L nasal cannula, abnormal   Date: 11/14/2011  Rate: 74  Rhythm: normal sinus rhythm  QRS Axis: normal  Intervals: normal  ST/T Wave abnormalities: normal  Conduction Disutrbances:none  Narrative Interpretation:   Old EKG Reviewed: changes noted  Ventricular bigeminy on 10/24/11  BORDERLINE ECG (LOW VOLTAGE)   cxr reviewed by me.  MDM  This 51 year old female presents for the second time in 6 weeks with concerns of chest pain.  The patient's description of this episode as being very similar to her prior procedure, the prior unremarkable evaluation, the absence of distress, the unremarkable vital signs, the negative labs here his all reassuring for the low probability of this being cardiac ischemia.  Following provision of analgesics, the patient's chest pain resolved, she noted her right arm still hurt, noted that she had not previously described chronic rotator cuff pain that she suffers from.  The patient has followup scheduled with Dr. Anne Fu this week and was discharged in stable condition to follow up as directed.  Gerhard Munch, MD 11/14/11 1843

## 2011-11-14 NOTE — Discharge Instructions (Signed)
It is very important that you continue to have the patient evaluated by your team of physicians, as discussed.  Please make sure to call tomorrow to insure appropriate followup care.  If you develop any new, or concerning changes in your condition, please return the emergency department immediately.

## 2011-11-17 ENCOUNTER — Other Ambulatory Visit: Payer: Self-pay | Admitting: Internal Medicine

## 2011-12-07 ENCOUNTER — Other Ambulatory Visit: Payer: Self-pay | Admitting: Internal Medicine

## 2011-12-16 ENCOUNTER — Encounter: Payer: Medicare Other | Admitting: Obstetrics & Gynecology

## 2011-12-27 NOTE — Progress Notes (Signed)
Addended by: Neomia Dear on: 12/27/2011 09:50 AM   Modules accepted: Orders

## 2011-12-28 ENCOUNTER — Ambulatory Visit (AMBULATORY_SURGERY_CENTER): Payer: Medicare Other

## 2011-12-28 VITALS — Ht 65.0 in | Wt 145.9 lb

## 2011-12-28 DIAGNOSIS — Z1211 Encounter for screening for malignant neoplasm of colon: Secondary | ICD-10-CM

## 2011-12-28 MED ORDER — PEG-KCL-NACL-NASULF-NA ASC-C 100 G PO SOLR: 1.0000 | Freq: Once | ORAL | Status: AC

## 2012-01-03 ENCOUNTER — Other Ambulatory Visit: Payer: Self-pay | Admitting: Internal Medicine

## 2012-01-04 ENCOUNTER — Other Ambulatory Visit: Payer: Medicare Other | Admitting: Gastroenterology

## 2012-01-12 ENCOUNTER — Encounter: Payer: Medicare Other | Admitting: Gastroenterology

## 2012-01-18 ENCOUNTER — Encounter (HOSPITAL_COMMUNITY): Payer: Self-pay | Admitting: *Deleted

## 2012-01-18 ENCOUNTER — Emergency Department (HOSPITAL_COMMUNITY)
Admission: EM | Admit: 2012-01-18 | Discharge: 2012-01-18 | Discharge status: Against Medical Advice | Payer: Medicare Other | Attending: Emergency Medicine | Admitting: Emergency Medicine

## 2012-01-18 DIAGNOSIS — R079 Chest pain, unspecified: Secondary | ICD-10-CM | POA: Insufficient documentation

## 2012-01-18 DIAGNOSIS — I10 Essential (primary) hypertension: Secondary | ICD-10-CM | POA: Insufficient documentation

## 2012-01-18 DIAGNOSIS — Z7982 Long term (current) use of aspirin: Secondary | ICD-10-CM | POA: Insufficient documentation

## 2012-01-18 DIAGNOSIS — M25519 Pain in unspecified shoulder: Secondary | ICD-10-CM | POA: Insufficient documentation

## 2012-01-18 DIAGNOSIS — F3289 Other specified depressive episodes: Secondary | ICD-10-CM | POA: Insufficient documentation

## 2012-01-18 DIAGNOSIS — E119 Type 2 diabetes mellitus without complications: Secondary | ICD-10-CM | POA: Insufficient documentation

## 2012-01-18 DIAGNOSIS — E785 Hyperlipidemia, unspecified: Secondary | ICD-10-CM | POA: Insufficient documentation

## 2012-01-18 DIAGNOSIS — F329 Major depressive disorder, single episode, unspecified: Secondary | ICD-10-CM | POA: Insufficient documentation

## 2012-01-18 DIAGNOSIS — Z79899 Other long term (current) drug therapy: Secondary | ICD-10-CM | POA: Insufficient documentation

## 2012-01-18 LAB — POCT I-STAT, CHEM 8
BUN: 8 mg/dL (ref 6–23)
Calcium, Ion: 1.16 mmol/L (ref 1.12–1.32)
Chloride: 111 mEq/L (ref 96–112)
Glucose, Bld: 128 mg/dL — ABNORMAL HIGH (ref 70–99)

## 2012-01-18 MED ORDER — GI COCKTAIL ~~LOC~~
30.0000 mL | Freq: Once | ORAL | Status: AC
  Administered 2012-01-18: 30 mL via ORAL
  Filled 2012-01-18: qty 30

## 2012-01-18 NOTE — ED Notes (Signed)
Pt states one hour ago was sitting on sofa and started having chest pain, sob, and tingling down right arm.

## 2012-01-18 NOTE — ED Provider Notes (Signed)
History     CSN: [id number]  Arrival date & time 01/18/12  1003   First MD Initiated Contact with Patient 01/18/12 1010      Chief Complaint  Patient presents with  . Chest Pain    HPI Pt states one hour ago was sitting on sofa and started having chest pain, sob, and tingling down right arm.  Patient has had several ER visits with similar complaints.  Has had admissions for same complaints and past.  Past Medical History  Diagnosis Date  . Diabetes mellitus   . Hypertension   . Hyperlipidemia   . Chronic shoulder pain 10 years    s/p multiple b/l rotator cuff repair sx - per MRI 04/2010 -  Delamination of the infraspinatus with mild fatty atrophysuggest distal tendon tear with medial propagation. Depending onthe hardware, CT arthrography may add information for evaluation othe rotator cuff.f   . Depression     Past Surgical History  Procedure Date  . Rotator cuff repair     Repair 2004, 2005 and 2007  . Rotator cuff repair 2004  . Rotator cuff repair     x3  . Breast surgery     Family History  Problem Relation Age of Onset  . Cancer Mother   . Stroke Father     History  Substance Use Topics  . Smoking status: Current Everyday Smoker -- 0.2 packs/day    Types: Cigarettes  . Smokeless tobacco: Never Used   Comment: 5-6 cigs/day  . Alcohol Use: No    OB History    Grav Para Term Preterm Abortions TAB SAB Ect Mult Living                  Review of Systems  All other systems reviewed and are negative.    Allergies  Cephalexin; Cephalexin; Codeine; Ketorolac tromethamine; and Morphine and related  Home Medications   Current Outpatient Rx  Name Route Sig Dispense Refill  . ASPIRIN EC 81 MG PO TBEC Oral Take 81 mg by mouth daily.    Marland Kitchen FERROUS SULFATE 325 (65 FE) MG PO TABS Oral Take 325 mg by mouth 3 (three) times daily with meals.    Marland Kitchen LISINOPRIL 10 MG PO TABS Oral Take 10 mg by mouth daily.    Marland Kitchen METFORMIN HCL 500 MG PO TABS Oral Take 500 mg by mouth 2  (two) times daily with a meal.    . OXYCODONE-ACETAMINOPHEN 10-325 MG PO TABS Oral Take 1 tablet by mouth every 4 (four) hours as needed. For pain.    Marland Kitchen SIMVASTATIN 20 MG PO TABS Oral Take 20 mg by mouth every evening.    Marland Kitchen TRAMADOL HCL 50 MG PO TABS Oral Take 100 mg by mouth 2 (two) times daily as needed. For pain.      BP 134/85  Pulse 96  Temp(Src) 98.2 F (36.8 C) (Oral)  Resp 20  SpO2 100%  LMP 12/28/2011  Physical Exam  Nursing note and vitals reviewed. Constitutional: She is oriented to person, place, and time. She appears well-developed and well-nourished. No distress.  HENT:  Head: Normocephalic and atraumatic.  Eyes: Pupils are equal, round, and reactive to light.  Neck: Normal range of motion.  Cardiovascular: Normal rate and intact distal pulses.        Normal sinus rhythm Rate equal 96 Low voltage QRS Septal infarct , age undetermined Abnormal ECG Sinus rhythm Low voltage QRS Borderline ECG  Pulmonary/Chest: No respiratory distress.  Abdominal: Normal appearance.  She exhibits no distension.  Musculoskeletal: Normal range of motion.  Neurological: She is alert and oriented to person, place, and time. No cranial nerve deficit.  Skin: Skin is warm and dry. No rash noted.  Psychiatric: She has a normal mood and affect. Her behavior is normal.    ED Course  Procedures (including critical care time)  Labs Reviewed  POCT I-STAT, CHEM 8 - Abnormal; Notable for the following:    Sodium 146 (*)    Potassium 3.3 (*)    Glucose, Bld 128 (*)    Hemoglobin 11.2 (*)    HCT 33.0 (*)    All other components within normal limits  POCT I-STAT TROPONIN I  LAB REPORT - SCANNED   No results found.   1. Chest pain       MDM   Patient left AGAINST MEDICAL ADVICE.  Was informed of risks including death.       Nelia Shi, MD 01/19/12 [phone]

## 2012-01-19 ENCOUNTER — Telehealth: Payer: Self-pay | Admitting: Gastroenterology

## 2012-01-19 NOTE — Telephone Encounter (Signed)
Returned pt's call; she says she cannot afford [employer].  Voucher for free prep mailed to pt. Kaylee Murphy

## 2012-01-24 NOTE — Progress Notes (Signed)
Addended by: Neomia Dear on: 01/24/2012 05:53 PM   Modules accepted: Orders

## 2012-01-26 ENCOUNTER — Other Ambulatory Visit: Payer: Self-pay | Admitting: Internal Medicine

## 2012-01-31 ENCOUNTER — Other Ambulatory Visit: Payer: Medicare Other | Admitting: Gastroenterology

## 2012-02-15 ENCOUNTER — Telehealth: Payer: Self-pay | Admitting: Gastroenterology

## 2012-02-15 ENCOUNTER — Other Ambulatory Visit: Payer: Medicare Other | Admitting: Gastroenterology

## 2012-03-10 ENCOUNTER — Other Ambulatory Visit: Payer: Medicare Other | Admitting: Gastroenterology

## 2012-03-13 NOTE — Addendum Note (Signed)
Addended by: Neomia Dear on: 03/13/2012 04:19 PM   Modules accepted: Orders

## 2012-05-22 ENCOUNTER — Other Ambulatory Visit: Payer: Self-pay | Admitting: Internal Medicine

## 2012-07-02 IMAGING — CR DG ANKLE COMPLETE 3+V*R*
3 series · 3 of 3 positions shown · non-contrast
Comparison: None.

CLINICAL DATA: Foot pain, and swelling

RIGHT ANKLE - COMPLETE 3+ VIEW

[t ankle joint ap right]
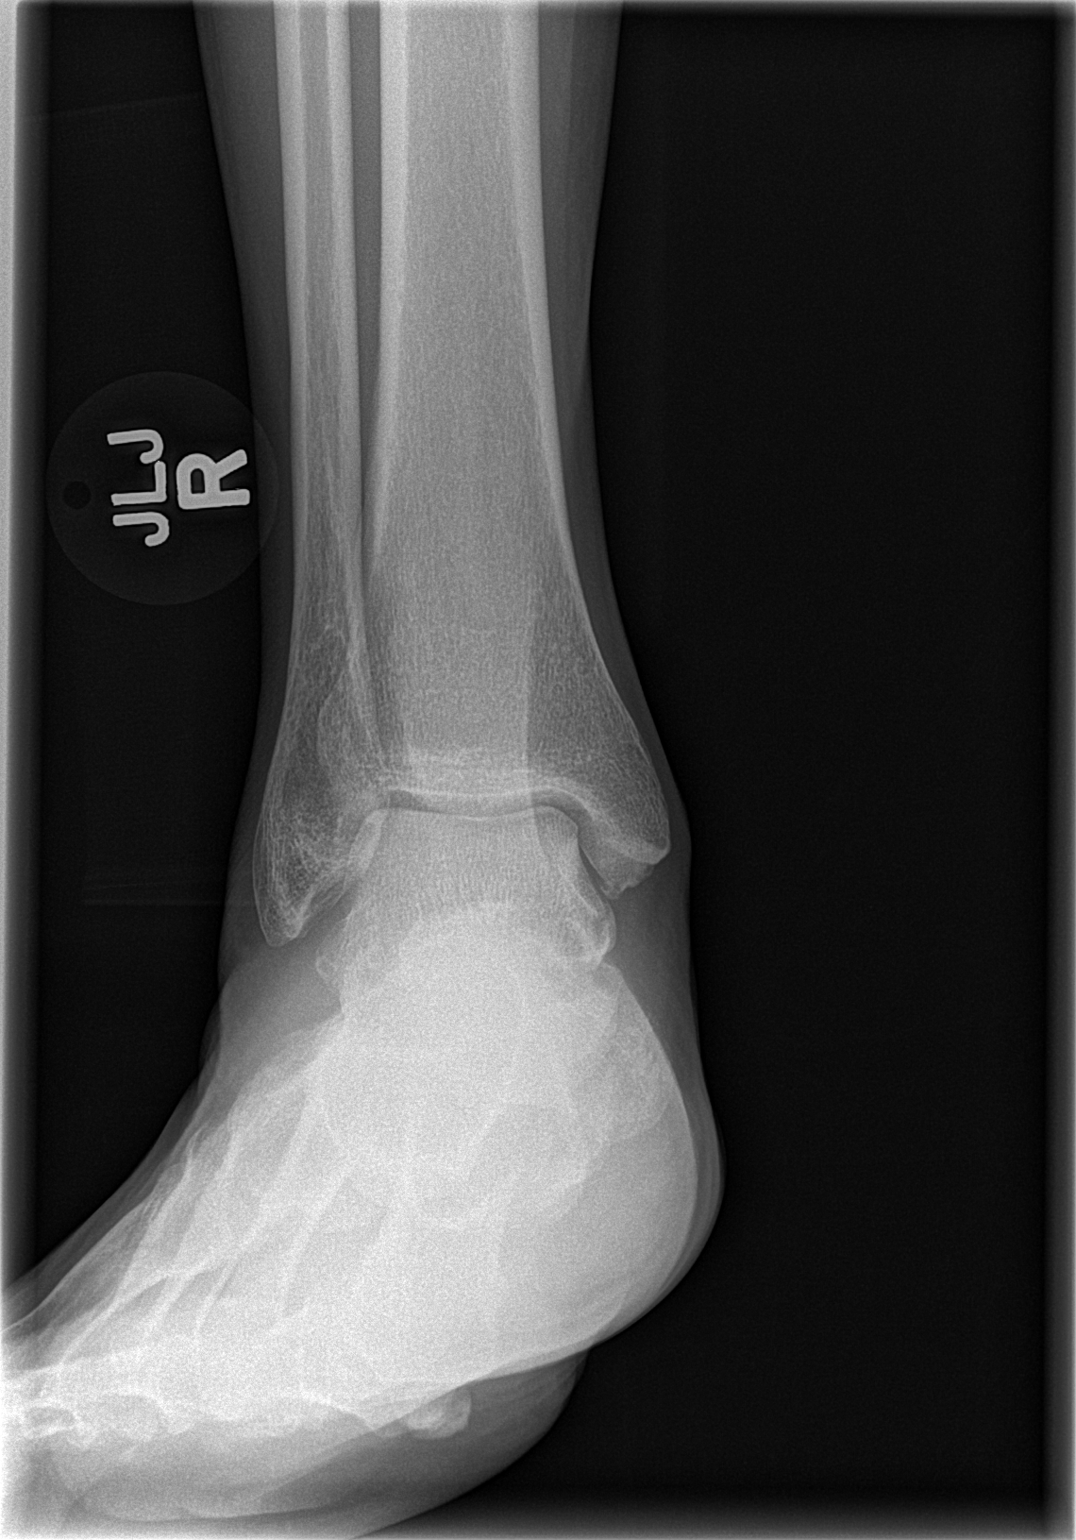

[t ankle joint oblique right]
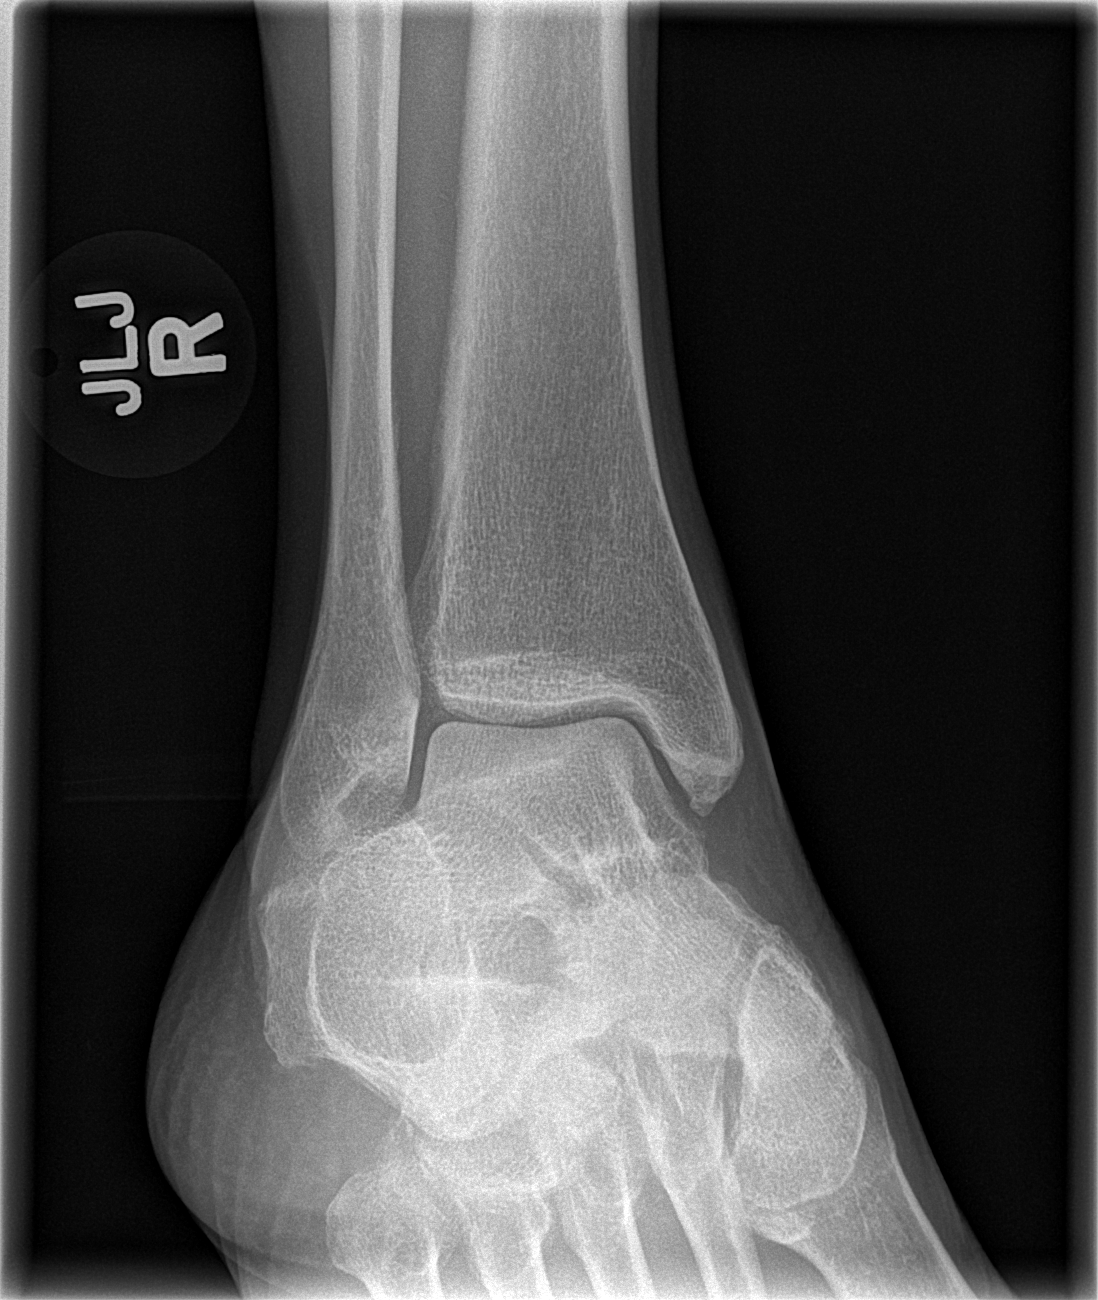

[t ankle joint lat right]
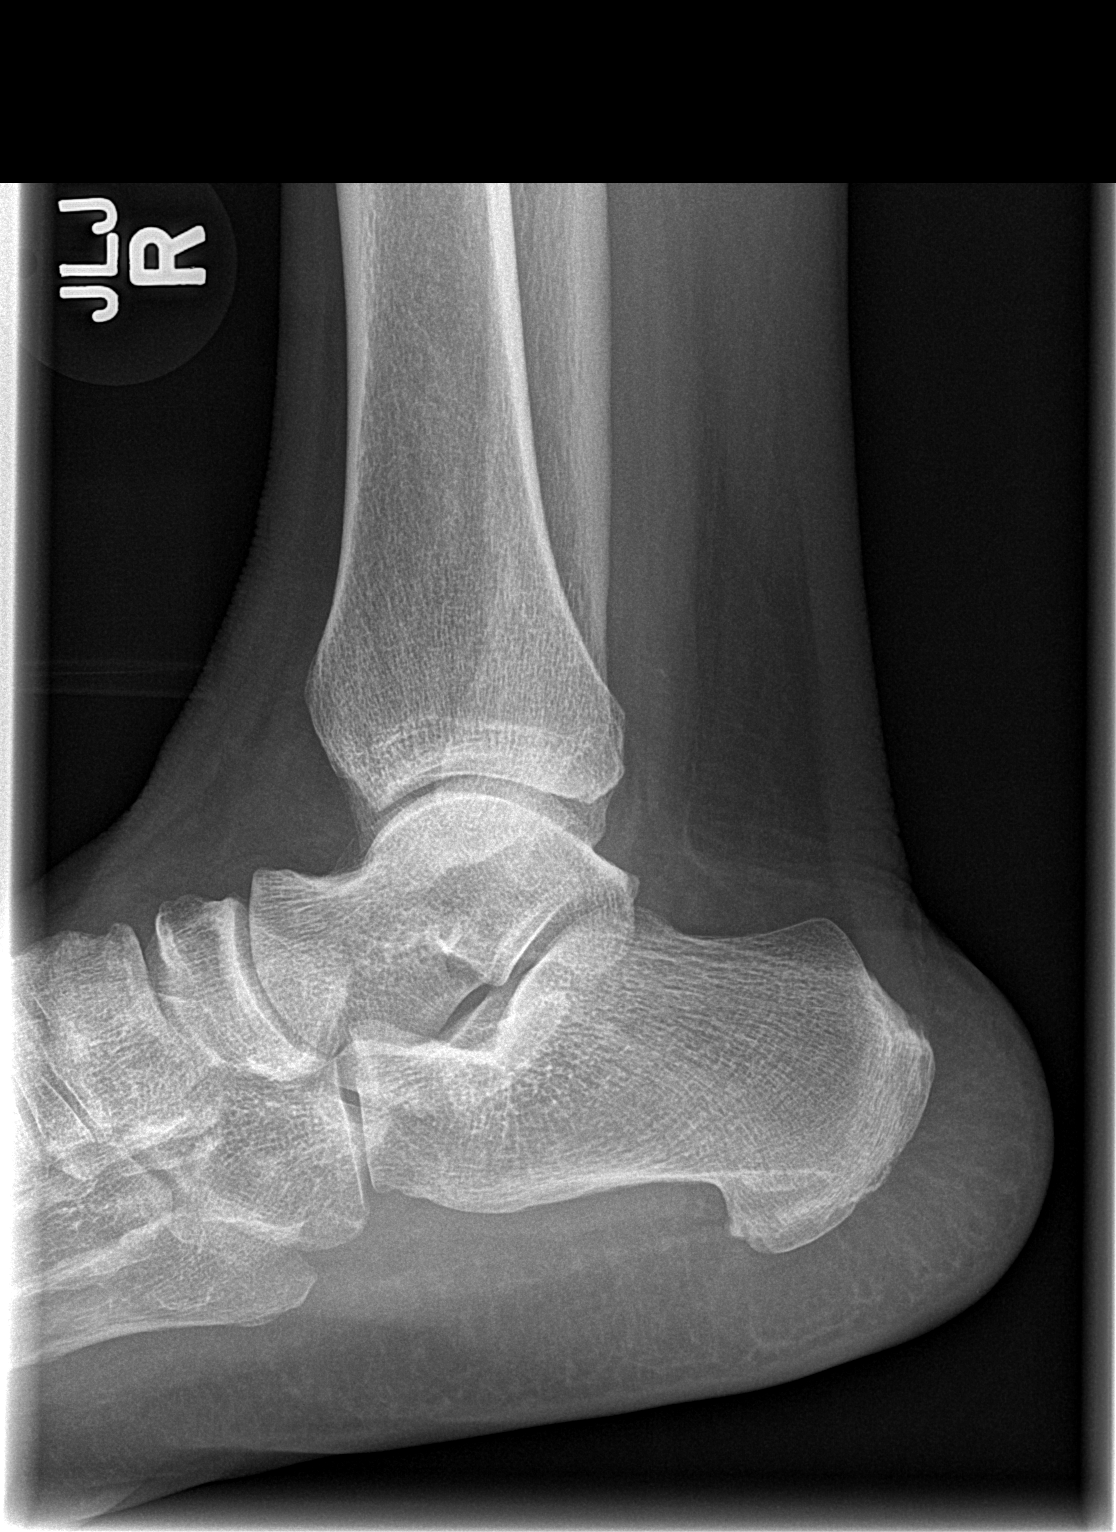

[3 of 3 positions shown; findings below may reference images not displayed]

FINDINGS: Ankle mortise intact.  Talar dome is normal.  No evidence
of malleolar fracture.  No soft tissue abnormality.
IMPRESSION: No evidence of ankle fracture.

## 2012-07-24 ENCOUNTER — Encounter: Payer: Medicare Other | Admitting: Internal Medicine

## 2012-08-27 ENCOUNTER — Encounter (HOSPITAL_COMMUNITY): Payer: Self-pay | Admitting: Nurse Practitioner

## 2012-08-27 ENCOUNTER — Emergency Department (HOSPITAL_COMMUNITY)
Admission: EM | Admit: 2012-08-27 | Discharge: 2012-08-27 | Disposition: A | Discharge status: Home / Self Care | Payer: Medicare Other | Attending: Emergency Medicine | Admitting: Emergency Medicine

## 2012-08-27 ENCOUNTER — Emergency Department (HOSPITAL_COMMUNITY): Payer: Medicare Other

## 2012-08-27 DIAGNOSIS — Z79899 Other long term (current) drug therapy: Secondary | ICD-10-CM | POA: Insufficient documentation

## 2012-08-27 DIAGNOSIS — F3289 Other specified depressive episodes: Secondary | ICD-10-CM | POA: Insufficient documentation

## 2012-08-27 DIAGNOSIS — R0602 Shortness of breath: Secondary | ICD-10-CM | POA: Insufficient documentation

## 2012-08-27 DIAGNOSIS — E119 Type 2 diabetes mellitus without complications: Secondary | ICD-10-CM | POA: Insufficient documentation

## 2012-08-27 DIAGNOSIS — R0789 Other chest pain: Secondary | ICD-10-CM | POA: Insufficient documentation

## 2012-08-27 DIAGNOSIS — I10 Essential (primary) hypertension: Secondary | ICD-10-CM | POA: Insufficient documentation

## 2012-08-27 DIAGNOSIS — E785 Hyperlipidemia, unspecified: Secondary | ICD-10-CM | POA: Insufficient documentation

## 2012-08-27 DIAGNOSIS — F17200 Nicotine dependence, unspecified, uncomplicated: Secondary | ICD-10-CM | POA: Insufficient documentation

## 2012-08-27 DIAGNOSIS — R079 Chest pain, unspecified: Secondary | ICD-10-CM

## 2012-08-27 DIAGNOSIS — Z7982 Long term (current) use of aspirin: Secondary | ICD-10-CM | POA: Insufficient documentation

## 2012-08-27 DIAGNOSIS — Z9889 Other specified postprocedural states: Secondary | ICD-10-CM | POA: Insufficient documentation

## 2012-08-27 DIAGNOSIS — G8929 Other chronic pain: Secondary | ICD-10-CM

## 2012-08-27 DIAGNOSIS — R300 Dysuria: Secondary | ICD-10-CM | POA: Insufficient documentation

## 2012-08-27 DIAGNOSIS — F329 Major depressive disorder, single episode, unspecified: Secondary | ICD-10-CM | POA: Insufficient documentation

## 2012-08-27 DIAGNOSIS — M25519 Pain in unspecified shoulder: Secondary | ICD-10-CM | POA: Insufficient documentation

## 2012-08-27 LAB — BASIC METABOLIC PANEL
Calcium: 9.3 mg/dL (ref 8.4–10.5)
Chloride: 104 mEq/L (ref 96–112)
Creatinine, Ser: 0.72 mg/dL (ref 0.50–1.10)
GFR calc Af Amer: >90 mL/min (ref >90)
Sodium: 140 mEq/L (ref 135–145)

## 2012-08-27 LAB — POCT I-STAT TROPONIN I: Troponin i, poc: 0 ng/mL (ref 0.00–0.08)

## 2012-08-27 LAB — URINALYSIS, ROUTINE W REFLEX MICROSCOPIC
Glucose, UA: NEGATIVE mg/dL
Ketones, ur: NEGATIVE mg/dL
Leukocytes, UA: NEGATIVE
Specific Gravity, Urine: 1.009 (ref 1.005–1.030)
pH: 6.5 (ref 5.0–8.0)

## 2012-08-27 LAB — CBC
MCV: 80.5 fL (ref 78.0–100.0)
Platelets: 322 10*3/uL (ref 150–400)
RDW: 16.4 % — ABNORMAL HIGH (ref 11.5–15.5)
WBC: 10.3 10*3/uL (ref 4.0–10.5)

## 2012-08-27 MED ORDER — NITROGLYCERIN 0.4 MG SL SUBL: 0.4000 mg | SUBLINGUAL_TABLET | SUBLINGUAL | Status: DC | PRN

## 2012-08-27 MED ORDER — ALBUTEROL SULFATE (5 MG/ML) 0.5% IN NEBU
10.0000 mg | INHALATION_SOLUTION | Freq: Once | RESPIRATORY_TRACT | Status: AC
  Administered 2012-08-27: 10 mg via RESPIRATORY_TRACT
  Filled 2012-08-27: qty 80

## 2012-08-27 NOTE — ED Notes (Signed)
C/o tightness in chest and R arm pain onset while washing dishes 3 hours ago. Denies nausea, sob. A&Ox4, breathing easily

## 2012-08-27 NOTE — ED Provider Notes (Signed)
History     CSN: [id number]  Arrival date & time 08/27/12  1528   First MD Initiated Contact with Patient 08/27/12 1550      Chief Complaint  Patient presents with  . Chest Pain    (Consider location/radiation/quality/duration/timing/severity/associated sxs/prior treatment) Patient is a 51 y.o. female presenting with chest pain and shoulder pain.  Chest Pain The chest pain began 3 - 5 hours ago. Chest pain occurs constantly. The chest pain is unchanged. Associated with: nothing. At its most intense, the pain is at 6/10. The pain is currently at 6/10. The severity of the pain is moderate. The quality of the pain is described as tightness. The pain does not radiate. Primary symptoms include shortness of breath (mild). Pertinent negatives for primary symptoms include no fever, no syncope, no cough, no abdominal pain, no nausea, no vomiting and no altered mental status. She tried nothing for the symptoms.    Shoulder Pain This is a chronic problem. Episode onset: originated with surgery for rotator cuff repair - worsened today. The problem occurs constantly. The problem has been unchanged. Associated symptoms include chest pain. Pertinent negatives include no abdominal pain, coughing, fever, nausea or vomiting. Exacerbated by: using the shoulder. She has tried nothing for the symptoms.    Past Medical History  Diagnosis Date  . Diabetes mellitus   . Hypertension   . Hyperlipidemia   . Chronic shoulder pain 10 years    s/p multiple b/l rotator cuff repair sx - per MRI 04/2010 -  Delamination of the infraspinatus with mild fatty atrophysuggest distal tendon tear with medial propagation. Depending onthe hardware, CT arthrography may add information for evaluation othe rotator cuff.f   . Depression     Past Surgical History  Procedure Date  . Rotator cuff repair     Repair 2004, 2005 and 2007  . Rotator cuff repair 2004  . Rotator cuff repair     x3  . Breast surgery     Family  History  Problem Relation Age of Onset  . Cancer Mother   . Stroke Father     History  Substance Use Topics  . Smoking status: Current Every Day Smoker -- 0.2 packs/day    Types: Cigarettes  . Smokeless tobacco: Never Used     Comment: 5-6 cigs/day  . Alcohol Use: No    OB History    Grav Para Term Preterm Abortions TAB SAB Ect Mult Living                  Review of Systems  Constitutional: Negative for fever.  Respiratory: Positive for shortness of breath (mild). Negative for cough.   Cardiovascular: Positive for chest pain. Negative for syncope.  Gastrointestinal: Negative for nausea, vomiting and abdominal pain.  Genitourinary: Positive for dysuria and frequency.  Psychiatric/Behavioral: Negative for altered mental status.  All other systems reviewed and are negative.    Allergies  Cephalexin; Cephalexin; Codeine; Ketorolac tromethamine; and Morphine and related  Home Medications   Current Outpatient Rx  Name  Route  Sig  Dispense  Refill  . ASPIRIN EC 81 MG PO TBEC   Oral   Take 81 mg by mouth daily.         Marland Kitchen FERROUS SULFATE 325 (65 FE) MG PO TABS   Oral   Take 325 mg by mouth 3 (three) times daily with meals.         Marland Kitchen LISINOPRIL 10 MG PO TABS   Oral   Take  10 mg by mouth at bedtime.          Marland Kitchen METFORMIN HCL 500 MG PO TABS   Oral   Take 500 mg by mouth 2 (two) times daily with a meal.         . OXYCODONE-ACETAMINOPHEN 10-325 MG PO TABS   Oral   Take 1 tablet by mouth every 4 (four) hours as needed. For pain.         Marland Kitchen SIMVASTATIN 20 MG PO TABS   Oral   Take 20 mg by mouth every evening.         Marland Kitchen TRAMADOL HCL 50 MG PO TABS   Oral   Take 100 mg by mouth 2 (two) times daily as needed. For pain.           BP 166/87  Pulse 79  Temp 98.7 F (37.1 C) (Oral)  Resp 16  SpO2 100%  LMP 07/31/2012  Physical Exam  Nursing note and vitals reviewed. Constitutional: She is oriented to person, place, and time. She appears  well-developed and well-nourished. No distress.  HENT:  Head: Normocephalic and atraumatic.  Eyes: EOM are normal. Pupils are equal, round, and reactive to light.  Neck: Normal range of motion. Neck supple.  Cardiovascular: Normal rate and regular rhythm.  Exam reveals no friction rub.   No murmur heard. Pulmonary/Chest: Effort normal and breath sounds normal. No respiratory distress. She has no wheezes. She has no rales. Tenderness: central, distal sternum.  Abdominal: Soft. She exhibits no distension. There is tenderness (mild) in the suprapubic area. There is no rebound.  Musculoskeletal: Normal range of motion. She exhibits no edema.       Right shoulder: She exhibits normal range of motion, no tenderness, no swelling, no deformity, no laceration, no pain, no spasm, normal pulse and normal strength.       Right elbow: She exhibits normal range of motion, no swelling, no effusion, no deformity and no laceration. no tenderness found.       Right wrist: She exhibits normal range of motion and no tenderness.       No pain with palpation of R carpal tunnel, Tinnel and Knavel signs negative.   Neurological: She is alert and oriented to person, place, and time.  Skin: She is not diaphoretic.    ED Course  Procedures (including critical care time)  Labs Reviewed  CBC - Abnormal; Notable for the following:    Hemoglobin 10.2 (*)     HCT 31.7 (*)     MCH 25.9 (*)     RDW 16.4 (*)     All other components within normal limits  POCT I-STAT TROPONIN I  BASIC METABOLIC PANEL  URINALYSIS, ROUTINE W REFLEX MICROSCOPIC  URINE CULTURE   Dg Chest 2 View  08/27/2012  *RADIOLOGY REPORT*  Clinical Data: Chest pain radiating to right arm.  Shortness of breath.  CHEST - 2 VIEW  Comparison:  11/14/2011  Findings:  The heart size and mediastinal contours are within normal limits.  Both lungs are clear.  The visualized skeletal structures are unremarkable.  IMPRESSION: No active cardiopulmonary disease.    Original Report Authenticated By: Myles Rosenthal, M.D.      1. Chronic shoulder pain   2. Chest pain      Date: 08/27/2012  Rate: 76  Rhythm: normal sinus rhythm  QRS Axis: normal  Intervals: normal  ST/T Wave abnormalities: nonspecific T wave changes - flattening in V4-V6  Conduction Disutrbances:none  Narrative  Interpretation: Q waves in anterior leads - similar to previous  Old EKG Reviewed: unchanged    MDM   History of diabetes, hypertension, hyperlipidemia presents multiple complaints. 1. 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 Constant and described as tightness located substernally. Does not radiate. No associated nausea or vomiting. No history of lung disease, however is a smoker. She states she has had a stress test previously was negative, however cannot find record of this in the computer. She was admitted 9 months ago for chest pain by the teaching service and had stress testing as an outpatient per their discharge summary. Her EKG is nonischemic and unchanged from previous. Patient is fair well-appearing. Her troponin is 0. Patient's symptoms are not consistent with ACS.  No need for delta troponin her for inpatient stress at this time. Instructed to followup with PCP for her chest pain. 2. Shoulder pain - Began prior to her chest pain. She states she has chronic right shoulder pain due to rotator cuff tear surgery. She states the pain starts in her mid to light arm medially and extends inferiorly. She says occasionally gets numb and tingles. She's had  This presentation of her arm pain before has had it checked out the doctors never found anything per the patient. She has normal strength, sensation, pulses. She has no neck pain it. She has decreased range of motion in her shoulder, refill this is due to her chronic shoulder pain for which she takes oxycodone daily. Should not miss a dose of Roxicodone today. I do not feel that this is cervical radiculopathy.  Like this is secondary to her chronic shoulder pain. She does not have any carpal tunnel tenderness so do not feel is related to carpal tunnel syndrome. 3. Suprapubic tenderness - patient states history of occasional UTI. She reports his urine frequency and mild superpubic pain. Denies any vaginal complaints. Denies fevers, nausea, vomiting. We'll check a urine, which is negative.  Instructed to followup with her primary care doctor for her chronic shoulder pain in her intermittent chest pain. Patient stable for discharge. Elwin Mocha, MD 08/28/12 [phone]

## 2012-08-27 NOTE — ED Provider Notes (Signed)
I saw and evaluated the patient, reviewed the resident's note and I agree with the findings and plan.  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 No anginal type symptoms. Her EKG does not show any evidence of ACS at this time. Lab work and x-rays are negative. She'll be stable for discharge.  Toy Baker, MD 08/27/12 1700

## 2012-08-28 LAB — URINE CULTURE: Colony Count: NO GROWTH

## 2012-08-31 NOTE — ED Provider Notes (Signed)
I saw and evaluated the patient, reviewed the resident's note and I agree with the findings and plan.  Toy Baker, MD 08/31/12 [phone]

## 2012-09-16 ENCOUNTER — Other Ambulatory Visit: Payer: Self-pay | Admitting: Internal Medicine

## 2012-11-14 ENCOUNTER — Other Ambulatory Visit: Payer: Self-pay | Admitting: Internal Medicine

## 2012-12-04 ENCOUNTER — Encounter: Payer: Medicare Other | Admitting: Internal Medicine

## 2012-12-13 ENCOUNTER — Other Ambulatory Visit: Payer: Self-pay | Admitting: Internal Medicine

## 2012-12-27 ENCOUNTER — Ambulatory Visit: Payer: Medicare Other | Admitting: Internal Medicine

## 2013-01-22 ENCOUNTER — Other Ambulatory Visit: Payer: Self-pay | Admitting: Internal Medicine

## 2013-01-30 ENCOUNTER — Encounter: Payer: Self-pay | Admitting: Internal Medicine

## 2013-01-30 ENCOUNTER — Ambulatory Visit (INDEPENDENT_AMBULATORY_CARE_PROVIDER_SITE_OTHER): Payer: Medicare Other | Admitting: Internal Medicine

## 2013-01-30 VITALS — BP 129/89 | HR 89 | Temp 98.9°F | Ht 64.5 in | Wt 135.4 lb

## 2013-01-30 DIAGNOSIS — E785 Hyperlipidemia, unspecified: Secondary | ICD-10-CM

## 2013-01-30 DIAGNOSIS — F419 Anxiety disorder, unspecified: Secondary | ICD-10-CM

## 2013-01-30 DIAGNOSIS — F329 Major depressive disorder, single episode, unspecified: Secondary | ICD-10-CM

## 2013-01-30 DIAGNOSIS — E119 Type 2 diabetes mellitus without complications: Secondary | ICD-10-CM

## 2013-01-30 DIAGNOSIS — I10 Essential (primary) hypertension: Secondary | ICD-10-CM

## 2013-01-30 DIAGNOSIS — F411 Generalized anxiety disorder: Secondary | ICD-10-CM

## 2013-01-30 LAB — POCT GLYCOSYLATED HEMOGLOBIN (HGB A1C): Hemoglobin A1C: 6.1

## 2013-01-30 MED ORDER — SERTRALINE HCL 50 MG PO TABS: ORAL_TABLET | ORAL | Status: DC

## 2013-01-30 MED ORDER — SERTRALINE HCL 50 MG PO TABS: 50.0000 mg | ORAL_TABLET | Freq: Every day | ORAL | Status: DC

## 2013-01-30 NOTE — Assessment & Plan Note (Signed)
Lab Results  Component Value Date   HGBA1C 6.1 01/30/2013   HGBA1C 6.9 10/20/2011   HGBA1C 7.0 08/24/2010     Assessment: Diabetes control:  well-controlled Progress toward A1C goal:   improved Comments:   Plan: Medications:  continue current medications, Metformin 500 mg twice daily. Home glucose monitoring: Frequency:   Timing:   Instruction/counseling given: reminded to bring medications to each visit and discussed diet Educational resources provided: brochure Self management tools provided:   Other plans: no changes in medications.

## 2013-01-30 NOTE — Patient Instructions (Signed)
Please make a followup appointment in 4-6 weeks.  Start taking Zoloft 50 mg daily for one week and then increase the dose to 100 mg daily.  Continue taking all other medications regularly as you do including metformin, lisinopril.

## 2013-01-30 NOTE — Progress Notes (Signed)
  Subjective:    Patient ID: Kaylee Murphy, female    DOB: 1961-05-09, 52 y.o.   MRN: [id number]  HPIpatient is a pleasant 89 year woman with well-controlled DM 2, hyperlipidemia, hypertension and other problems as per problem list who comes the clinic for diabetes followup and anxiety.  Her A1c today is 6.1. She is on metformin 500 mg twice daily.  She reports having anxiety which is getting worse.  She does not take any medications for it. She has tried fluoxetine in past but did not help much so she stopped taking it in 2 weeks. She said that Ativan had worked better for in past.  She denies any depressive symptoms or suicidal ideations.  She denies any fever, chills, abdominal pain, palpitations, urinary problems, vision changes.   She does report having some headaches along with nausea intermittently. Denies any weakness or numbness in the body. Denies any vomiting.   Review of Systems    as per history of present illness. Objective:   Physical Exam  General: NAD. Mildly anxious HEENT: PERRL, EOMI, no scleral icterus Cardiac: S1,S2,RRR, no rubs, murmurs or gallops Pulm: clear to auscultation bilaterally, moving normal volumes of air Abd: soft, nontender, nondistended, BS present Ext: warm and well perfused, no pedal edema Neuro: alert and oriented X3, cranial nerves II-XII grossly intact       Assessment & Plan:

## 2013-01-30 NOTE — Assessment & Plan Note (Signed)
Worsening anxiety. Patient not on any SSRIs or benzos. Requesting benzos if possible as that has worked better for her in past. Discussed with her in detail about importance of SSRI in long-term for anxiety. - Decided to start on Zoloft 50 mg daily and increase to 100 mg daily after first week. - reevaluate in 4-6 weeks and increase the dose to 150 mg daily as needed. - If no improvement in symptoms with Zoloft, might consider bridging with benzo and adding another SSRI.

## 2013-01-30 NOTE — Assessment & Plan Note (Signed)
Continue Zocor 

## 2013-01-30 NOTE — Progress Notes (Signed)
Case discussed with Dr. Patel (at time of visit, soon after the resident saw the patient).  We reviewed the resident's history and exam and pertinent patient test results.  I agree with the assessment, diagnosis, and plan of care documented in the resident's note. 

## 2013-01-30 NOTE — Assessment & Plan Note (Signed)
BP Readings from Last 3 Encounters:  01/30/13 129/89  08/27/12 166/87  01/18/12 134/85    Lab Results  Component Value Date   NA 140 08/27/2012   K 3.8 08/27/2012   CREATININE 0.72 08/27/2012    Assessment: Blood pressure control:  at goal Progress toward BP goal:   improved Comments:   Plan: Medications:  continue current medications, Continue lisinopril 10 mg daily Educational resources provided: brochure;video Self management tools provided: home blood pressure logbook Other plans: no changes in medication. Patient wondering if she needs to be on clonidine as it has worked better for the past. Advised her to be on lisinopril and increase the dose to maximum of 40 mg daily as needed as that would help her with her diabetes.

## 2013-02-09 IMAGING — CR DG CHEST 2V
2 series · 2 of 2 positions shown · non-contrast
Comparison: 05/26/2009

CLINICAL DATA: Left arm pain

CHEST - 2 VIEW

[w chest pa]
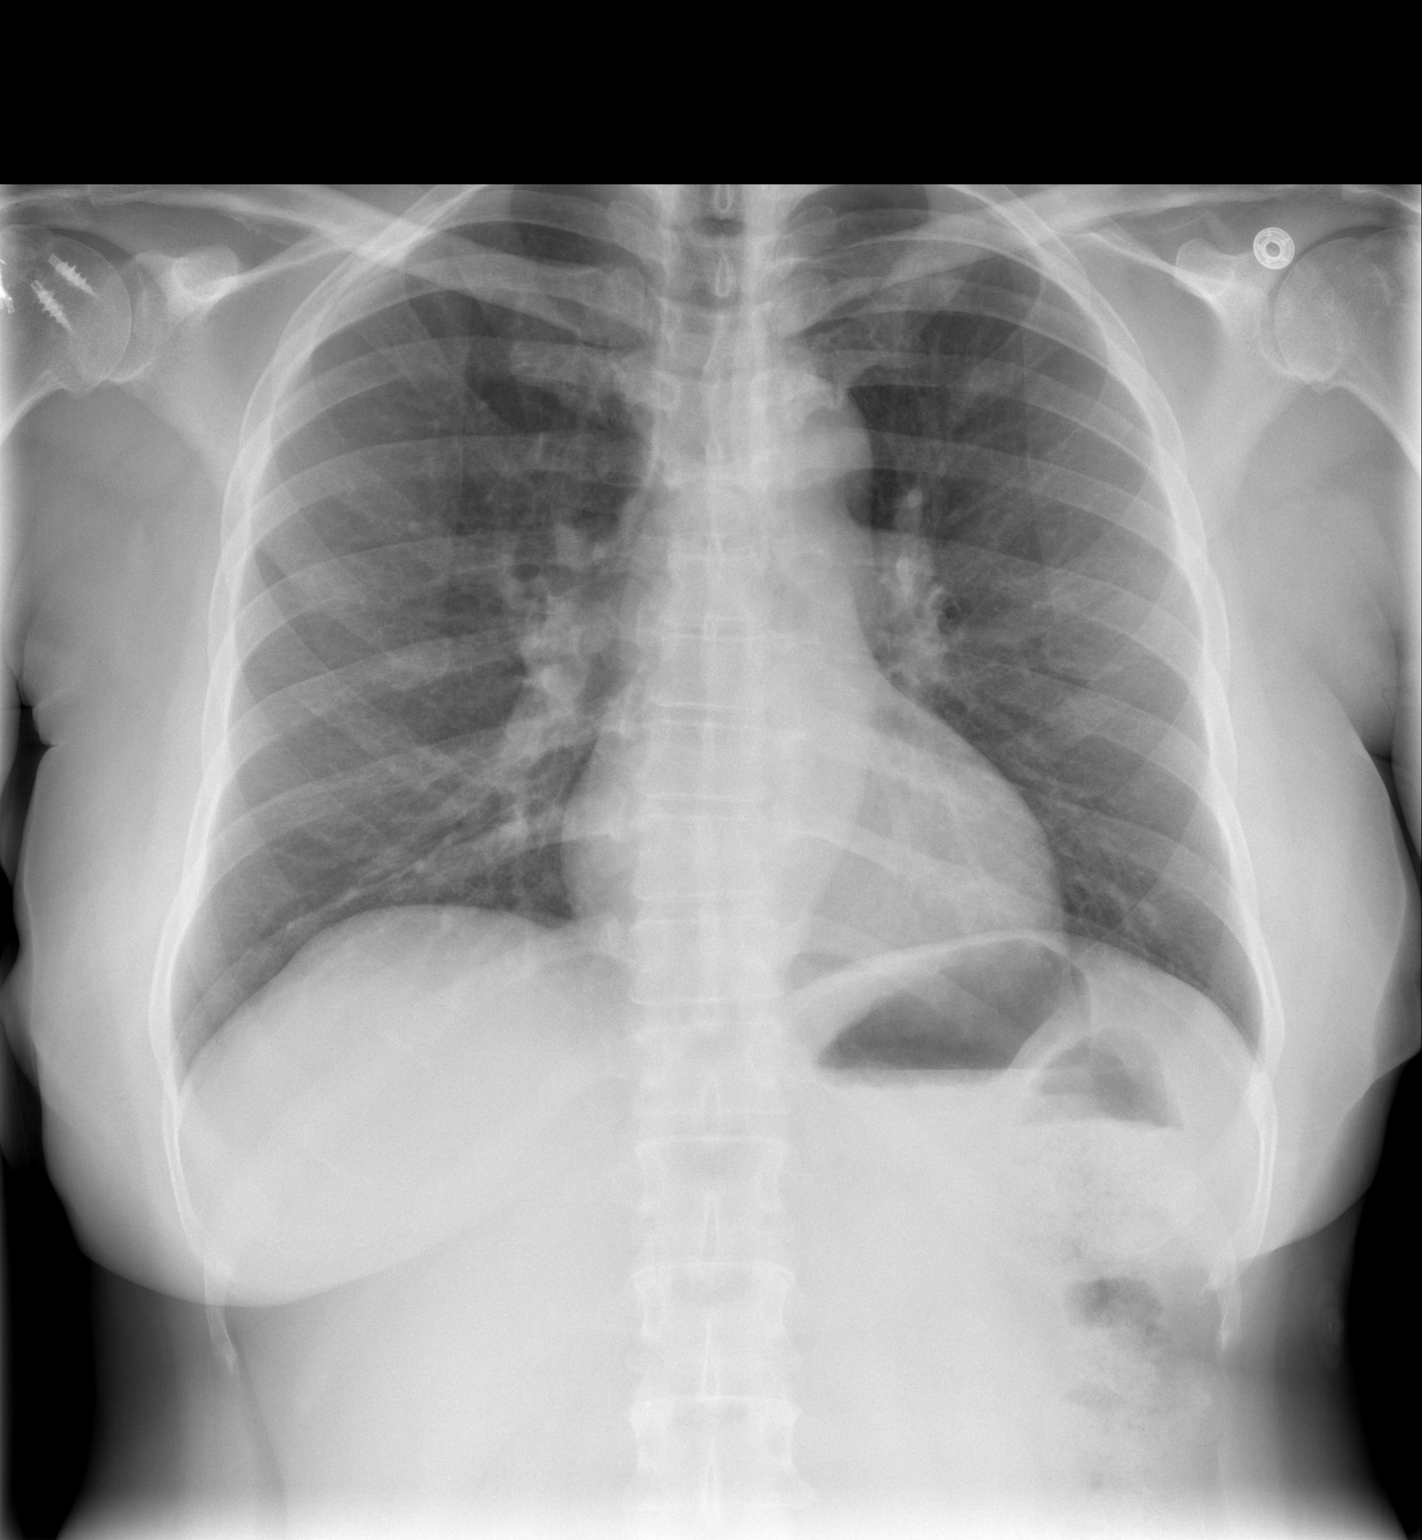

[w chest lat]
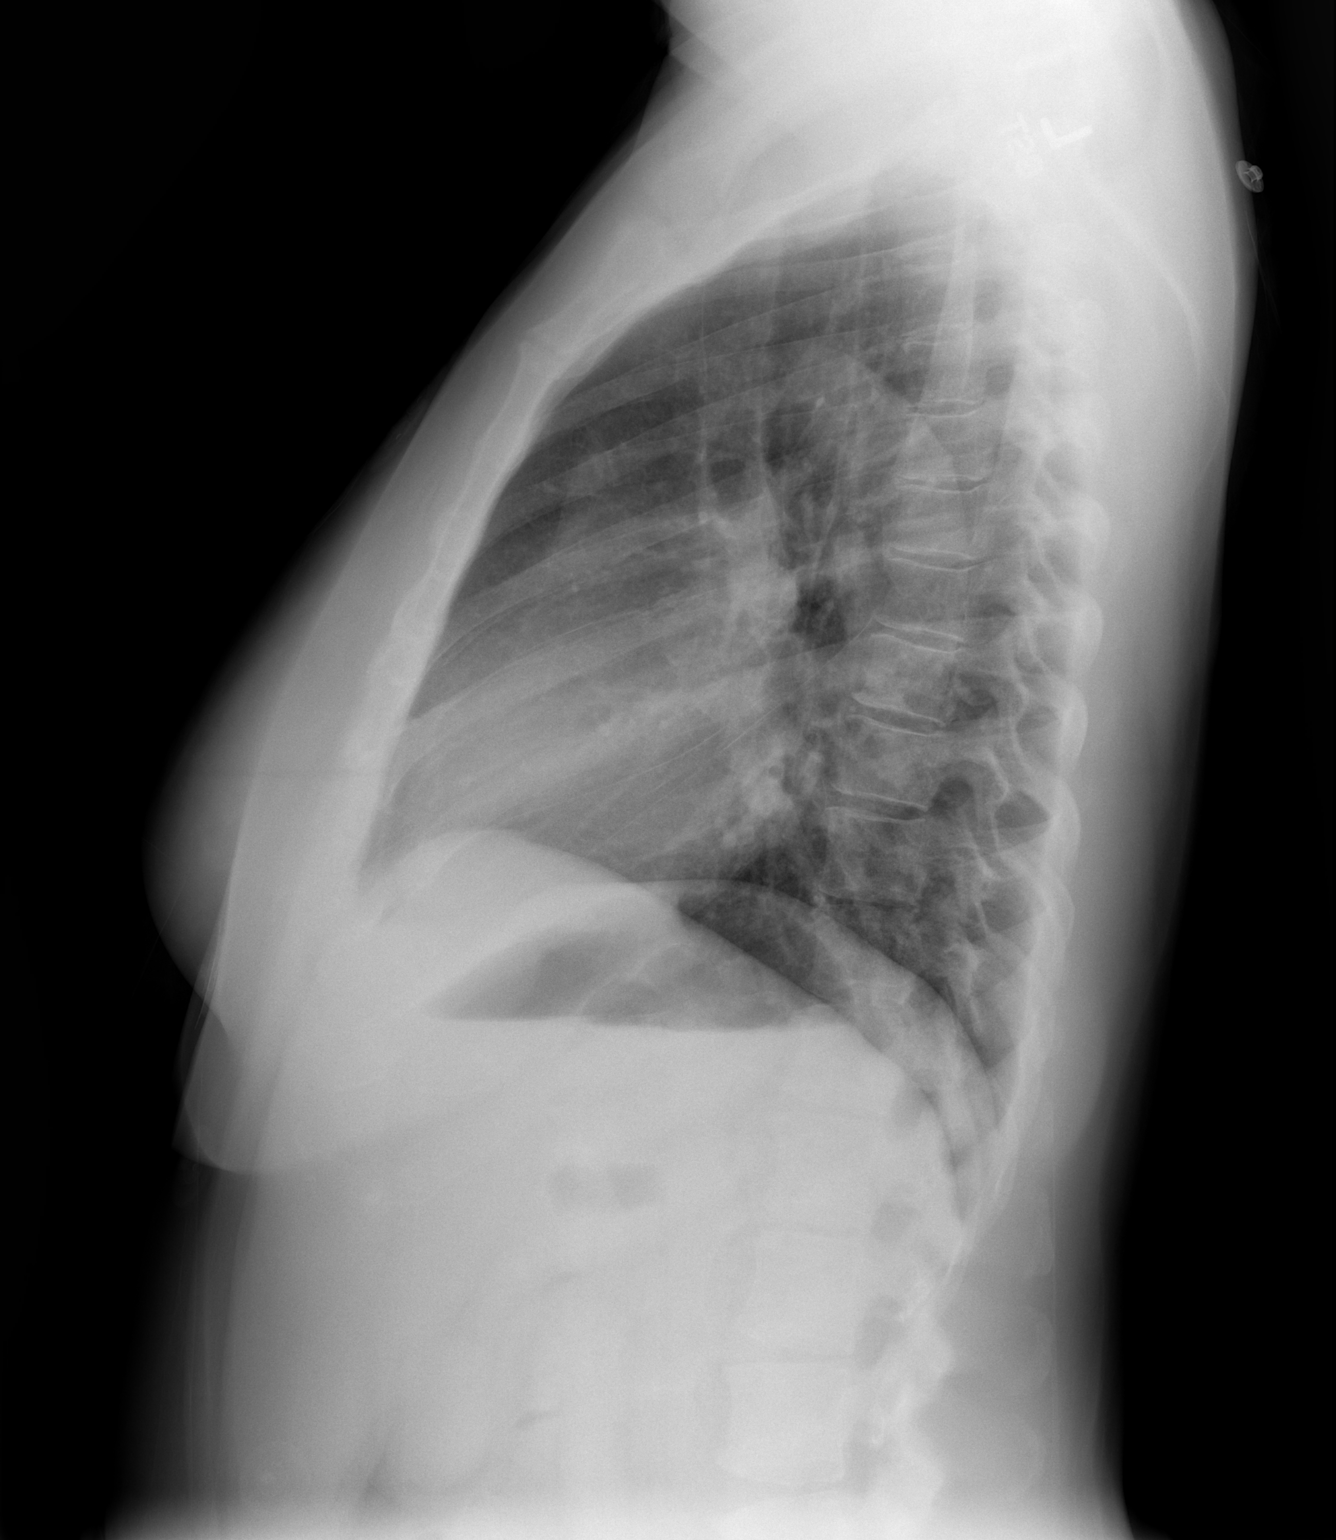

[2 of 2 positions shown; findings below may reference images not displayed]

FINDINGS: Upper normal-size of cardiac silhouette.
Tortuous aorta.
Pulmonary vascularity normal.
Minimal chronic peribronchial thickening.
No pulmonary infiltrate, pleural effusion, or pneumothorax.
Osseous structures unremarkable.
IMPRESSION: No acute abnormalities.

## 2013-02-11 ENCOUNTER — Emergency Department (HOSPITAL_COMMUNITY)
Admission: EM | Admit: 2013-02-11 | Discharge: 2013-02-11 | Disposition: A | Discharge status: Home / Self Care | Payer: Medicare Other | Attending: Emergency Medicine | Admitting: Emergency Medicine

## 2013-02-11 ENCOUNTER — Encounter (HOSPITAL_COMMUNITY): Payer: Self-pay | Admitting: Nurse Practitioner

## 2013-02-11 DIAGNOSIS — Z79899 Other long term (current) drug therapy: Secondary | ICD-10-CM | POA: Insufficient documentation

## 2013-02-11 DIAGNOSIS — Z881 Allergy status to other antibiotic agents status: Secondary | ICD-10-CM | POA: Insufficient documentation

## 2013-02-11 DIAGNOSIS — F17200 Nicotine dependence, unspecified, uncomplicated: Secondary | ICD-10-CM | POA: Insufficient documentation

## 2013-02-11 DIAGNOSIS — F329 Major depressive disorder, single episode, unspecified: Secondary | ICD-10-CM | POA: Insufficient documentation

## 2013-02-11 DIAGNOSIS — M25519 Pain in unspecified shoulder: Secondary | ICD-10-CM | POA: Insufficient documentation

## 2013-02-11 DIAGNOSIS — H53149 Visual discomfort, unspecified: Secondary | ICD-10-CM | POA: Insufficient documentation

## 2013-02-11 DIAGNOSIS — E785 Hyperlipidemia, unspecified: Secondary | ICD-10-CM | POA: Insufficient documentation

## 2013-02-11 DIAGNOSIS — I10 Essential (primary) hypertension: Secondary | ICD-10-CM | POA: Insufficient documentation

## 2013-02-11 DIAGNOSIS — Z885 Allergy status to narcotic agent status: Secondary | ICD-10-CM | POA: Insufficient documentation

## 2013-02-11 DIAGNOSIS — K089 Disorder of teeth and supporting structures, unspecified: Secondary | ICD-10-CM | POA: Insufficient documentation

## 2013-02-11 DIAGNOSIS — K0889 Other specified disorders of teeth and supporting structures: Secondary | ICD-10-CM

## 2013-02-11 DIAGNOSIS — G8929 Other chronic pain: Secondary | ICD-10-CM | POA: Insufficient documentation

## 2013-02-11 DIAGNOSIS — R51 Headache: Secondary | ICD-10-CM | POA: Insufficient documentation

## 2013-02-11 DIAGNOSIS — E119 Type 2 diabetes mellitus without complications: Secondary | ICD-10-CM | POA: Insufficient documentation

## 2013-02-11 DIAGNOSIS — F3289 Other specified depressive episodes: Secondary | ICD-10-CM | POA: Insufficient documentation

## 2013-02-11 DIAGNOSIS — Z7982 Long term (current) use of aspirin: Secondary | ICD-10-CM | POA: Insufficient documentation

## 2013-02-11 MED ORDER — METOCLOPRAMIDE HCL 5 MG/ML IJ SOLN
10.0000 mg | Freq: Once | INTRAMUSCULAR | Status: AC
  Administered 2013-02-11: 10 mg via INTRAVENOUS
  Filled 2013-02-11: qty 2

## 2013-02-11 MED ORDER — SODIUM CHLORIDE 0.9 % IV BOLUS (SEPSIS)
1000.0000 mL | Freq: Once | INTRAVENOUS | Status: AC
  Administered 2013-02-11: 1000 mL via INTRAVENOUS

## 2013-02-11 MED ORDER — DIPHENHYDRAMINE HCL 50 MG/ML IJ SOLN
25.0000 mg | Freq: Once | INTRAMUSCULAR | Status: AC
  Administered 2013-02-11: 25 mg via INTRAVENOUS
  Filled 2013-02-11: qty 1

## 2013-02-11 MED ORDER — CLINDAMYCIN HCL 300 MG PO CAPS
300.0000 mg | ORAL_CAPSULE | Freq: Once | ORAL | Status: AC
  Administered 2013-02-11: 300 mg via ORAL
  Filled 2013-02-11: qty 1

## 2013-02-11 NOTE — ED Provider Notes (Signed)
52 year old female comes in with severe dental pain and headache secondary to that. She has a tooth with very bad cavities and she is scheduled to see a dentist tomorrow. This is tooth #23. On exam, the tube is allowed to the gingival line and the gingiva is for a tender. She will be given IV fluids as well as antibiotics and medication for her headache and she is to see her dentist tomorrow as scheduled.  I saw and evaluated the patient, reviewed the resident's note and I agree with the findings and plan.   Dione Booze, MD 02/11/13 1919

## 2013-02-11 NOTE — ED Provider Notes (Signed)
History     CSN: [id number]  Arrival date & time 02/11/13  1733   First MD Initiated Contact with Patient 02/11/13 1830      Chief Complaint  Patient presents with  . Dental Pain    (Consider location/radiation/quality/duration/timing/severity/associated sxs/prior treatment) HPI Comments: 52 y.o. female who presents to the Er w/ the cc of left lower tooth pain. She states started about 2 days ago. With the headache, she developed global HA as well. States she has had similar HA prior. She does not have nausea or vomiting w/ HA, but does have photophobia. She does not have neck pain with this. No fevers or chills. No difficulty breathing. Pt states she is scheduled to see a dentist tomorrow for extraction of her tooth.   Patient is a 52 y.o. female presenting with general illness. The history is provided by the patient.  Illness Location:  Tooth Severity:  Mild Onset quality:  Gradual Timing:  Constant Progression:  Worsening Chronicity:  Chronic Associated symptoms: headaches   Associated symptoms: no abdominal pain, no chest pain, no cough, no diarrhea, no fatigue, no fever, no rash, no vomiting and no wheezing     Past Medical History  Diagnosis Date  . Diabetes mellitus   . Hypertension   . Hyperlipidemia   . Chronic shoulder pain 10 years    s/p multiple b/l rotator cuff repair sx - per MRI 04/2010 -  Delamination of the infraspinatus with mild fatty atrophysuggest distal tendon tear with medial propagation. Depending onthe hardware, CT arthrography may add information for evaluation othe rotator cuff.f   . Depression     Past Surgical History  Procedure Laterality Date  . Rotator cuff repair      Repair 2004, 2005 and 2007  . Rotator cuff repair  2004  . Rotator cuff repair      x3  . Breast surgery      Family History  Problem Relation Age of Onset  . Cancer Mother   . Stroke Father     History  Substance Use Topics  . Smoking status: Current Every Day  Smoker -- 0.25 packs/day    Types: Cigarettes  . Smokeless tobacco: Never Used     Comment: 5-6 cigs/day  . Alcohol Use: No    OB History   Grav Para Term Preterm Abortions TAB SAB Ect Mult Living                  Review of Systems  Constitutional: Negative for fever, chills and fatigue.  HENT: Positive for dental problem. Negative for facial swelling, drooling and neck pain.   Eyes: Negative for pain, discharge and itching.  Respiratory: Negative for cough, choking, wheezing and stridor.   Cardiovascular: Negative for chest pain.  Gastrointestinal: Negative for vomiting, abdominal pain and diarrhea.  Endocrine: Negative for cold intolerance and heat intolerance.  Genitourinary: Negative for vaginal discharge, difficulty urinating and vaginal pain.  Skin: Negative for pallor and rash.  Neurological: Positive for headaches. Negative for dizziness and light-headedness.  Psychiatric/Behavioral: Negative for behavioral problems and agitation.    Allergies  Cephalexin; Cephalexin; Codeine; Ketorolac tromethamine; and Morphine and related  Home Medications   Current Outpatient Rx  Name  Route  Sig  Dispense  Refill  . aspirin EC 81 MG tablet   Oral   Take 81 mg by mouth daily.         Marland Kitchen lisinopril (PRINIVIL,ZESTRIL) 10 MG tablet   Oral   Take 10 mg  by mouth daily.         . metFORMIN (GLUCOPHAGE) 500 MG tablet   Oral   Take 500 mg by mouth 2 (two) times daily with a meal.         . naproxen (NAPROSYN) 500 MG tablet   Oral   Take 500 mg by mouth 2 (two) times daily with a meal.         . oxyCODONE (ROXICODONE) 15 MG immediate release tablet   Oral   Take 1-2 tablets by mouth daily as needed.         . traMADol (ULTRAM) 50 MG tablet   Oral   Take 100 mg by mouth every 6 (six) hours as needed for pain.           BP 161/90  Pulse 79  Temp(Src) 98.4 F (36.9 C) (Oral)  Resp 15  Ht 5\' 4"  (1.626 m)  Wt 140 lb (63.504 kg)  BMI 24.02 kg/m2  SpO2 100%   LMP 12/31/2012  Physical Exam  Constitutional: She is oriented to person, place, and time. She appears well-developed. No distress.  HENT:  Head: Normocephalic and atraumatic.  Lower canine is ttp and source of her pain. She has no ludwig. She has no change in voice. No drooling present. No neck pain. No nuchal rigidity.   Eyes: Pupils are equal, round, and reactive to light. Right eye exhibits no discharge. Left eye exhibits no discharge.  Neck: Neck supple. No tracheal deviation present.  Cardiovascular: Normal rate.  Exam reveals no gallop and no friction rub.   Pulmonary/Chest: No stridor. No respiratory distress. She has no wheezes.  Abdominal: Soft. She exhibits no distension. There is no tenderness. There is no rebound.  Musculoskeletal: She exhibits no edema and no tenderness.  Neurological: She is alert and oriented to person, place, and time. She has normal reflexes. No cranial nerve deficit. Coordination normal.  Able to ambulate in the room with normal gait. Heel to toe is present while ambulating. 5/5 strength UE and LE.   Skin: Skin is warm. She is not diaphoretic.    ED Course  Procedures (including critical care time)  Labs Reviewed - No data to display No results found.   MDM  Will give HA cocktail for HA -- doubt SAH or meningitis based on hpi and physical exam findings. No neuro deficits on exam, does not need imaging.She has no signs of airway compromise or ludwig on exam -- she is already scheduled to see dentist tomorrow for extraction of tooth.   HA is resolved w/ HA cocktail. Repeat neuro exam without acute deficits. Pt states she feels -- "a lot better". She is given clinda PO in our Er, and since she is scheduled to see a dentist tomorrow already, a script of clinda is not given to her as will allow dentist to choose to place pt on longer term abx therapy.   1. Pain, dental   2. Headache             Bernadene Person, MD 02/11/13 2118

## 2013-02-11 NOTE — ED Notes (Signed)
States she has a toothache, headache, and her R shoulder hurts.

## 2013-03-06 ENCOUNTER — Ambulatory Visit: Payer: Medicare Other | Admitting: Internal Medicine

## 2013-03-22 ENCOUNTER — Other Ambulatory Visit: Payer: Self-pay

## 2013-04-11 ENCOUNTER — Encounter (HOSPITAL_COMMUNITY): Payer: Self-pay | Admitting: Emergency Medicine

## 2013-04-11 ENCOUNTER — Emergency Department (HOSPITAL_COMMUNITY)
Admission: EM | Admit: 2013-04-11 | Discharge: 2013-04-11 | Disposition: A | Discharge status: Home / Self Care | Payer: Medicare Other | Attending: Emergency Medicine | Admitting: Emergency Medicine

## 2013-04-11 DIAGNOSIS — E119 Type 2 diabetes mellitus without complications: Secondary | ICD-10-CM | POA: Insufficient documentation

## 2013-04-11 DIAGNOSIS — I10 Essential (primary) hypertension: Secondary | ICD-10-CM | POA: Insufficient documentation

## 2013-04-11 DIAGNOSIS — Z862 Personal history of diseases of the blood and blood-forming organs and certain disorders involving the immune mechanism: Secondary | ICD-10-CM | POA: Insufficient documentation

## 2013-04-11 DIAGNOSIS — M545 Low back pain, unspecified: Secondary | ICD-10-CM | POA: Insufficient documentation

## 2013-04-11 DIAGNOSIS — Z79899 Other long term (current) drug therapy: Secondary | ICD-10-CM | POA: Insufficient documentation

## 2013-04-11 DIAGNOSIS — M549 Dorsalgia, unspecified: Secondary | ICD-10-CM

## 2013-04-11 DIAGNOSIS — G8929 Other chronic pain: Secondary | ICD-10-CM | POA: Insufficient documentation

## 2013-04-11 DIAGNOSIS — R52 Pain, unspecified: Secondary | ICD-10-CM | POA: Insufficient documentation

## 2013-04-11 DIAGNOSIS — Z7982 Long term (current) use of aspirin: Secondary | ICD-10-CM | POA: Insufficient documentation

## 2013-04-11 DIAGNOSIS — F17200 Nicotine dependence, unspecified, uncomplicated: Secondary | ICD-10-CM | POA: Insufficient documentation

## 2013-04-11 DIAGNOSIS — Z8639 Personal history of other endocrine, nutritional and metabolic disease: Secondary | ICD-10-CM | POA: Insufficient documentation

## 2013-04-11 DIAGNOSIS — F3289 Other specified depressive episodes: Secondary | ICD-10-CM | POA: Insufficient documentation

## 2013-04-11 DIAGNOSIS — F329 Major depressive disorder, single episode, unspecified: Secondary | ICD-10-CM | POA: Insufficient documentation

## 2013-04-11 MED ORDER — HYDROMORPHONE HCL PF 1 MG/ML IJ SOLN
1.0000 mg | Freq: Once | INTRAMUSCULAR | Status: AC
  Administered 2013-04-11: 1 mg via INTRAMUSCULAR
  Filled 2013-04-11: qty 1

## 2013-04-11 NOTE — ED Provider Notes (Signed)
Medical screening examination/treatment/procedure(s) were performed by non-physician practitioner and as supervising physician I was immediately available for consultation/collaboration.   Enid Skeens, MD 04/11/13 [phone]

## 2013-04-11 NOTE — ED Notes (Signed)
Onset lower back pain yesterday while sitting. Radiates to left leg. Goes to pain clinic on [address]

## 2013-04-11 NOTE — ED Provider Notes (Signed)
CSN: [id number]     Arrival date & time 04/11/13  1055 History     None    Chief Complaint  Patient presents with  . Back Pain   (Consider location/radiation/quality/duration/timing/severity/associated sxs/prior Treatment) The history is provided by the patient and medical records.   Patient presents to the ED for non-traumatic, acute exacerbation of chronic back pain (herniated discs and pinched nerves in back) starting yesterday. No recent injury. Pain worse with weightbearing and ambulation. Pain intermittently radiates to left leg.   No numbness or paresthesias of LE.  No loss of bowel or bladder function.  Patient is currently establish with pain clinic, followup appointment scheduled for tomorrow.  Has been taking oxycodone as directed.  Past Medical History  Diagnosis Date  . Diabetes mellitus   . Hypertension   . Hyperlipidemia   . Chronic shoulder pain 10 years    s/p multiple b/l rotator cuff repair sx - per MRI 04/2010 -  Delamination of the infraspinatus with mild fatty atrophysuggest distal tendon tear with medial propagation. Depending onthe hardware, CT arthrography may add information for evaluation othe rotator cuff.f   . Depression    Past Surgical History  Procedure Laterality Date  . Rotator cuff repair      Repair 2004, 2005 and 2007  . Rotator cuff repair  2004  . Rotator cuff repair      x3  . Breast surgery     Family History  Problem Relation Age of Onset  . Cancer Mother   . Stroke Father    History  Substance Use Topics  . Smoking status: Current Every Day Smoker -- 0.25 packs/day    Types: Cigarettes  . Smokeless tobacco: Never Used     Comment: 5-6 cigs/day  . Alcohol Use: No   OB History   Grav Para Term Preterm Abortions TAB SAB Ect Mult Living                 Review of Systems  Musculoskeletal: Positive for back pain.  All other systems reviewed and are negative.    Allergies  Cephalexin; Aspirin; Codeine; Ketorolac  tromethamine; and Morphine and related  Home Medications   Current Outpatient Rx  Name  Route  Sig  Dispense  Refill  . aspirin EC 81 MG tablet   Oral   Take 81 mg by mouth daily.         Marland Kitchen lisinopril (PRINIVIL,ZESTRIL) 10 MG tablet   Oral   Take 10 mg by mouth daily.         . metFORMIN (GLUCOPHAGE) 500 MG tablet   Oral   Take 500 mg by mouth 2 (two) times daily with a meal.         . oxyCODONE (ROXICODONE) 15 MG immediate release tablet   Oral   Take 1-2 tablets by mouth daily as needed.         . traMADol (ULTRAM) 50 MG tablet   Oral   Take 100 mg by mouth every 6 (six) hours as needed for pain.          BP 161/83  Pulse 92  Temp(Src) 97.6 F (36.4 C) (Oral)  Resp 18  SpO2 98%  LMP 04/04/2013  Physical Exam  Nursing note and vitals reviewed. Constitutional: She is oriented to person, place, and time. She appears well-developed and well-nourished.  HENT:  Head: Normocephalic and atraumatic.  Eyes: Conjunctivae and EOM are normal.  Neck: Normal range of motion. Neck supple.  Cardiovascular: Normal rate, regular rhythm and normal heart sounds.   Pulmonary/Chest: Effort normal and breath sounds normal.  Musculoskeletal: She exhibits no edema.       Lumbar back: She exhibits decreased range of motion, tenderness, bony tenderness and pain. She exhibits no swelling, no edema, no deformity, no laceration, no spasm and normal pulse.       Back:  Neurological: She is alert and oriented to person, place, and time.  Skin: Skin is warm and dry.  Psychiatric: She has a normal mood and affect.  Tearful during exam    ED Course   Procedures (including critical care time)  Labs Reviewed - No data to display No results found.  1. Back pain, chronic     MDM   Non-traumatic, acute on chronic back pain.  No concern for cauda equina.  Pt acknowledges she is in pain mngmt, seems reasonable-- IM dilaudid given.  FU with pain management at previously scheduled  appt tomorrow.  Discussed plan with pt, she agreed.  Return precautions advised.  Garlon Hatchet, PA-C 04/11/13 1157

## 2013-05-03 ENCOUNTER — Encounter (HOSPITAL_COMMUNITY): Payer: Self-pay | Admitting: *Deleted

## 2013-05-03 ENCOUNTER — Emergency Department (HOSPITAL_COMMUNITY)
Admission: EM | Admit: 2013-05-03 | Discharge: 2013-05-03 | Disposition: A | Discharge status: Home / Self Care | Payer: Medicare Other | Attending: Emergency Medicine | Admitting: Emergency Medicine

## 2013-05-03 DIAGNOSIS — S0300XA Dislocation of jaw, unspecified side, initial encounter: Secondary | ICD-10-CM

## 2013-05-03 DIAGNOSIS — Z885 Allergy status to narcotic agent status: Secondary | ICD-10-CM | POA: Insufficient documentation

## 2013-05-03 DIAGNOSIS — Z881 Allergy status to other antibiotic agents status: Secondary | ICD-10-CM | POA: Insufficient documentation

## 2013-05-03 DIAGNOSIS — H9209 Otalgia, unspecified ear: Secondary | ICD-10-CM | POA: Insufficient documentation

## 2013-05-03 DIAGNOSIS — F17200 Nicotine dependence, unspecified, uncomplicated: Secondary | ICD-10-CM | POA: Insufficient documentation

## 2013-05-03 DIAGNOSIS — F3289 Other specified depressive episodes: Secondary | ICD-10-CM | POA: Insufficient documentation

## 2013-05-03 DIAGNOSIS — Z888 Allergy status to other drugs, medicaments and biological substances status: Secondary | ICD-10-CM | POA: Insufficient documentation

## 2013-05-03 DIAGNOSIS — M79609 Pain in unspecified limb: Secondary | ICD-10-CM | POA: Insufficient documentation

## 2013-05-03 DIAGNOSIS — I10 Essential (primary) hypertension: Secondary | ICD-10-CM | POA: Insufficient documentation

## 2013-05-03 DIAGNOSIS — E119 Type 2 diabetes mellitus without complications: Secondary | ICD-10-CM | POA: Insufficient documentation

## 2013-05-03 DIAGNOSIS — M545 Low back pain, unspecified: Secondary | ICD-10-CM | POA: Insufficient documentation

## 2013-05-03 DIAGNOSIS — F329 Major depressive disorder, single episode, unspecified: Secondary | ICD-10-CM | POA: Insufficient documentation

## 2013-05-03 DIAGNOSIS — E785 Hyperlipidemia, unspecified: Secondary | ICD-10-CM | POA: Insufficient documentation

## 2013-05-03 DIAGNOSIS — M25519 Pain in unspecified shoulder: Secondary | ICD-10-CM | POA: Insufficient documentation

## 2013-05-03 DIAGNOSIS — Z79899 Other long term (current) drug therapy: Secondary | ICD-10-CM | POA: Insufficient documentation

## 2013-05-03 DIAGNOSIS — G8929 Other chronic pain: Secondary | ICD-10-CM | POA: Insufficient documentation

## 2013-05-03 DIAGNOSIS — R209 Unspecified disturbances of skin sensation: Secondary | ICD-10-CM | POA: Insufficient documentation

## 2013-05-03 DIAGNOSIS — M26609 Unspecified temporomandibular joint disorder, unspecified side: Secondary | ICD-10-CM | POA: Insufficient documentation

## 2013-05-03 DIAGNOSIS — Z7982 Long term (current) use of aspirin: Secondary | ICD-10-CM | POA: Insufficient documentation

## 2013-05-03 DIAGNOSIS — M269 Dentofacial anomaly, unspecified: Secondary | ICD-10-CM | POA: Insufficient documentation

## 2013-05-03 MED ORDER — HYDROMORPHONE HCL PF 1 MG/ML IJ SOLN
1.0000 mg | Freq: Once | INTRAMUSCULAR | Status: AC
  Administered 2013-05-03: 1 mg via INTRAMUSCULAR
  Filled 2013-05-03: qty 1

## 2013-05-03 NOTE — ED Provider Notes (Signed)
CSN: [id number]     Arrival date & time 05/03/13  1304 History     First MD Initiated Contact with Patient 05/03/13 1319     Chief Complaint  Patient presents with  . Jaw Pain  . Back Pain   (Consider location/radiation/quality/duration/timing/severity/associated sxs/prior Treatment) HPI Pt is a 52yo female with hx of chronic back pain c/o LBP and left ear pain. LBP flared up about 3 days ago.  States she has "3 pinched nerves" and is suppose to start injections at pain clinic but does not have appointment until 8/28.  Pain is sharp, stabbing in lower back, constant, 9/10, radiates down right leg with intermittent numbness to toes.  Worse with movement.  No new injuries.  Denies urinary incontinence  or retention.  Pain in left ear radiates into left jaw.  Worse with chewing and palpation. Pain is 4/10.  Denies sore throat, or difficulty breathing or swallowing.     Past Medical History  Diagnosis Date  . Diabetes mellitus   . Hypertension   . Hyperlipidemia   . Chronic shoulder pain 10 years    s/p multiple b/l rotator cuff repair sx - per MRI 04/2010 -  Delamination of the infraspinatus with mild fatty atrophysuggest distal tendon tear with medial propagation. Depending onthe hardware, CT arthrography may add information for evaluation othe rotator cuff.f   . Depression    Past Surgical History  Procedure Laterality Date  . Rotator cuff repair      Repair 2004, 2005 and 2007  . Rotator cuff repair  2004  . Rotator cuff repair      x3  . Breast surgery     Family History  Problem Relation Age of Onset  . Cancer Mother   . Stroke Father    History  Substance Use Topics  . Smoking status: Current Every Day Smoker -- 0.25 packs/day    Types: Cigarettes  . Smokeless tobacco: Never Used     Comment: 5-6 cigs/day  . Alcohol Use: No   OB History   Grav Para Term Preterm Abortions TAB SAB Ect Mult Living                 Review of Systems  Constitutional: Negative for  fever and chills.  HENT: Positive for dental problem ( jaw pain). Negative for sore throat, neck pain and neck stiffness.   Musculoskeletal: Positive for myalgias and back pain.  All other systems reviewed and are negative.    Allergies  Cephalexin; Aspirin; Codeine; Ketorolac tromethamine; and Morphine and related  Home Medications   Current Outpatient Rx  Name  Route  Sig  Dispense  Refill  . aspirin EC 81 MG tablet   Oral   Take 81 mg by mouth daily.         Marland Kitchen ibuprofen (ADVIL,MOTRIN) 200 MG tablet   Oral   Take 800 mg by mouth daily as needed for pain.         Marland Kitchen lisinopril (PRINIVIL,ZESTRIL) 10 MG tablet   Oral   Take 10 mg by mouth daily.         . metFORMIN (GLUCOPHAGE) 500 MG tablet   Oral   Take 500 mg by mouth daily.          Marland Kitchen oxyCODONE (ROXICODONE) 15 MG immediate release tablet   Oral   Take 15 mg by mouth 4 (four) times daily.           BP 127/91  Temp(Src) 97.9 F (  36.6 C) (Oral)  Resp 20  SpO2 99%  LMP 04/04/2013 Physical Exam  Nursing note and vitals reviewed. Constitutional: She is oriented to person, place, and time. She appears well-developed and well-nourished.  HENT:  Head: Normocephalic and atraumatic.  Right Ear: Hearing, tympanic membrane, external ear and ear canal normal.  Left Ear: Hearing, tympanic membrane, external ear and ear canal normal.  Nose: Nose normal.  Mouth/Throat: Uvula is midline, oropharynx is clear and moist and mucous membranes are normal. No oral lesions. Abnormal dentition. No dental abscesses. No tonsillar abscesses.    Multiple missing back molars on lower jaw.  No dental abscess or lesions. TTP near left TMJ.   Eyes: EOM are normal.  Neck: Normal range of motion.  Cardiovascular: Normal rate.   Pulmonary/Chest: Effort normal.  Musculoskeletal: Normal range of motion. She exhibits tenderness. She exhibits no edema.       Arms: TTP lumbar spina and musculature.  No step offs or crepitus.     Neurological: She is alert and oriented to person, place, and time.  Antalgic gait  Skin: Skin is warm and dry.  Psychiatric: She has a normal mood and affect. Her behavior is normal.    ED Course   Procedures (including critical care time)  Labs Reviewed - No data to display No results found. 1. Chronic back pain   2. TMJ (dislocation of temporomandibular joint), initial encounter     MDM  No new injury, no red flag symptoms. No imaging needed at this time.  No dental abscess.  Believe jaw pain due to TMJ, possibly from grinding teeth.  Advised to f/u with dentist if pain persists. Pt has appointment with pain management 8/28, states she is going to start getting injections soon.  Pt seems reliable. Tx: dilaudid in ED.  No prescription meds.    Junius Finner, PA-C 05/03/13 1726

## 2013-05-03 NOTE — ED Notes (Signed)
Pt c/o left ear ache x 3 days. Also c/o low back pain. Hx of "3 pinched nerves" and is supposed to start getting "injections" at the pain clinic.

## 2013-05-03 NOTE — ED Notes (Signed)
Pt c/o waking up with sore jaw (L).  Pain increases with chewing, yawning, etc.  Pt also c/o lower back pain, which she says is her chronic lower back pain.

## 2013-05-04 NOTE — ED Provider Notes (Signed)
Medical screening examination/treatment/procedure(s) were performed by non-physician practitioner and as supervising physician I was immediately available for consultation/collaboration.   Gwyneth Sprout, MD 05/04/13 1007

## 2013-05-22 ENCOUNTER — Other Ambulatory Visit (HOSPITAL_COMMUNITY): Payer: Self-pay | Admitting: Internal Medicine

## 2013-05-22 DIAGNOSIS — Z1231 Encounter for screening mammogram for malignant neoplasm of breast: Secondary | ICD-10-CM

## 2013-05-24 ENCOUNTER — Encounter (HOSPITAL_COMMUNITY): Payer: Self-pay | Admitting: Emergency Medicine

## 2013-05-24 ENCOUNTER — Emergency Department (HOSPITAL_COMMUNITY)
Admission: EM | Admit: 2013-05-24 | Discharge: 2013-05-24 | Disposition: A | Discharge status: Home / Self Care | Payer: Medicare Other | Attending: Emergency Medicine | Admitting: Emergency Medicine

## 2013-05-24 DIAGNOSIS — E119 Type 2 diabetes mellitus without complications: Secondary | ICD-10-CM | POA: Insufficient documentation

## 2013-05-24 DIAGNOSIS — F17200 Nicotine dependence, unspecified, uncomplicated: Secondary | ICD-10-CM | POA: Insufficient documentation

## 2013-05-24 DIAGNOSIS — I10 Essential (primary) hypertension: Secondary | ICD-10-CM | POA: Insufficient documentation

## 2013-05-24 DIAGNOSIS — Z79899 Other long term (current) drug therapy: Secondary | ICD-10-CM | POA: Insufficient documentation

## 2013-05-24 DIAGNOSIS — Z7982 Long term (current) use of aspirin: Secondary | ICD-10-CM | POA: Insufficient documentation

## 2013-05-24 DIAGNOSIS — E785 Hyperlipidemia, unspecified: Secondary | ICD-10-CM | POA: Insufficient documentation

## 2013-05-24 DIAGNOSIS — Z8659 Personal history of other mental and behavioral disorders: Secondary | ICD-10-CM | POA: Insufficient documentation

## 2013-05-24 DIAGNOSIS — G8929 Other chronic pain: Secondary | ICD-10-CM

## 2013-05-24 DIAGNOSIS — M549 Dorsalgia, unspecified: Secondary | ICD-10-CM | POA: Insufficient documentation

## 2013-05-24 MED ORDER — HYDROMORPHONE HCL PF 1 MG/ML IJ SOLN
1.0000 mg | Freq: Once | INTRAMUSCULAR | Status: AC
  Administered 2013-05-24: 1 mg via INTRAMUSCULAR
  Filled 2013-05-24: qty 1

## 2013-05-24 NOTE — ED Notes (Signed)
Pt c/o lower back and right shoulder pain chronic in nature worse x 3 days

## 2013-05-24 NOTE — ED Provider Notes (Signed)
CSN: [id number]     Arrival date & time 05/24/13  4098 History   First MD Initiated Contact with Patient 05/24/13 [phone]     Chief Complaint  Patient presents with  . Back Pain  . Shoulder Pain   (Consider location/radiation/quality/duration/timing/severity/associated sxs/prior Treatment) HPI  Kaylee Murphy is a 52 y.o.female with a significant PMH of DM, hypertension, chronic pain presents to the ER with complaints of shoulder and back pain. She said she was taking care of her little grandchildren 3 days ago and when she bent over to pick him up she exacerbated her chronic pain. She normally takes OxyContin at home for this but has not been helping. She comes to the ED requesting better pain control. She has history of multiple surgeries to the shoulder for rotator cuff repair. He shouldn't denies pain being different from any exacerbation she's had before. She denies any chest pain, shortness of breath, wheezing, fevers, weakness.   Past Medical History  Diagnosis Date  . Diabetes mellitus   . Hypertension   . Hyperlipidemia   . Chronic shoulder pain 10 years    s/p multiple b/l rotator cuff repair sx - per MRI 04/2010 -  Delamination of the infraspinatus with mild fatty atrophysuggest distal tendon tear with medial propagation. Depending onthe hardware, CT arthrography may add information for evaluation othe rotator cuff.f   . Depression    Past Surgical History  Procedure Laterality Date  . Rotator cuff repair      Repair 2004, 2005 and 2007  . Rotator cuff repair  2004  . Rotator cuff repair      x3  . Breast surgery     Family History  Problem Relation Age of Onset  . Cancer Mother   . Stroke Father    History  Substance Use Topics  . Smoking status: Current Every Day Smoker -- 0.25 packs/day    Types: Cigarettes  . Smokeless tobacco: Never Used     Comment: 5-6 cigs/day  . Alcohol Use: No   OB History   Grav Para Term Preterm Abortions TAB SAB Ect Mult Living               Review of Systems ROS is negative unless otherwise stated in the HPI  Allergies  Cephalexin; Aspirin; Codeine; Ketorolac tromethamine; and Morphine and related  Home Medications   Current Outpatient Rx  Name  Route  Sig  Dispense  Refill  . aspirin EC 81 MG tablet   Oral   Take 81 mg by mouth daily.         Marland Kitchen lisinopril (PRINIVIL,ZESTRIL) 10 MG tablet   Oral   Take 10 mg by mouth daily.         . metFORMIN (GLUCOPHAGE) 500 MG tablet   Oral   Take 500 mg by mouth daily.          . OxyCODONE (OXYCONTIN) 20 mg T12A 12 hr tablet   Oral   Take 20 mg by mouth every 4 (four) hours.          BP 144/74  Pulse 71  Temp(Src) 98 F (36.7 C) (Oral)  Resp 18  SpO2 100% Physical Exam  Nursing note and vitals reviewed. Constitutional: She appears well-developed and well-nourished. No distress.  HENT:  Head: Normocephalic and atraumatic.  Eyes: Pupils are equal, round, and reactive to light.  Neck: Normal range of motion. Neck supple.  Cardiovascular: Normal rate and regular rhythm.   Pulmonary/Chest: Effort normal.  Abdominal:  Soft.  Musculoskeletal:       Right shoulder: She exhibits decreased range of motion, tenderness, pain and spasm. She exhibits no bony tenderness, no swelling, no effusion, no crepitus, no deformity, no laceration, normal pulse and normal strength.  Neurological: She is alert.  Skin: Skin is warm and dry.    ED Course  Procedures (including critical care time) Labs Review Labs Reviewed - No data to display Imaging Review No results found.  MDM   1. Chronic pain    Chronic pain exacerbation takes narcotic pain medication home. Will give shot of IM Dilaudid pain control and discharge patient without any prescriptions. She is to followup with her PCP.  52 y.o.Kaylee Murphy's evaluation in the Emergency Department is complete. It has been determined that no acute conditions requiring further emergency intervention are present  at this time. The patient/guardian have been advised of the diagnosis and plan. We have discussed signs and symptoms that warrant return to the ED, such as changes or worsening in symptoms.  Vital signs are stable at discharge. Filed Vitals:   05/24/13 0905  BP: 144/74  Pulse: 71  Temp: 98 F (36.7 C)  Resp: 18    Patient/guardian has voiced understanding and agreed to follow-up with the PCP or specialist.     Dorthula Matas, PA-C 05/24/13 1031

## 2013-05-24 NOTE — ED Provider Notes (Signed)
Medical screening examination/treatment/procedure(s) were performed by non-physician practitioner and as supervising physician I was immediately available for consultation/collaboration.   Jesiah Grismer, MD 05/24/13 1551 

## 2013-05-24 NOTE — ED Notes (Addendum)
Pt has hx of "3 pinched nerves" in lower back. Hx of bilateral shoulder arthroscopies in [place]. Today, c/o right neck pain radiating to right shoulder. Feels like "something squeezing" her right upper arm. Also c/o low back pain.

## 2013-05-30 ENCOUNTER — Encounter: Payer: Medicare Other | Admitting: Internal Medicine

## 2013-05-30 ENCOUNTER — Ambulatory Visit (HOSPITAL_COMMUNITY)
Admission: RE | Admit: 2013-05-30 | Discharge: 2013-05-30 | Disposition: A | Discharge status: Home / Self Care | Payer: Medicare Other | Source: Ambulatory Visit | Attending: Internal Medicine | Admitting: Internal Medicine

## 2013-05-30 DIAGNOSIS — Z1231 Encounter for screening mammogram for malignant neoplasm of breast: Secondary | ICD-10-CM | POA: Insufficient documentation

## 2013-06-07 ENCOUNTER — Telehealth: Payer: Self-pay | Admitting: *Deleted

## 2013-06-07 NOTE — Telephone Encounter (Signed)
?  not a pt here at [facility].

## 2013-06-07 NOTE — Telephone Encounter (Deleted)
Fax from the pharmacy - pt requesting refill on Tramadol 50mg  - take 2 tabs 2 times daily.  NOT on current med list. Last filled 04/16/13.  Thanks

## 2013-07-01 ENCOUNTER — Emergency Department (HOSPITAL_COMMUNITY)
Admission: EM | Admit: 2013-07-01 | Discharge: 2013-07-01 | Disposition: A | Discharge status: Home / Self Care | Payer: Medicare Other | Attending: Emergency Medicine | Admitting: Emergency Medicine

## 2013-07-01 ENCOUNTER — Encounter (HOSPITAL_COMMUNITY): Payer: Self-pay | Admitting: Emergency Medicine

## 2013-07-01 DIAGNOSIS — Z8739 Personal history of other diseases of the musculoskeletal system and connective tissue: Secondary | ICD-10-CM | POA: Insufficient documentation

## 2013-07-01 DIAGNOSIS — I10 Essential (primary) hypertension: Secondary | ICD-10-CM | POA: Insufficient documentation

## 2013-07-01 DIAGNOSIS — Z79899 Other long term (current) drug therapy: Secondary | ICD-10-CM | POA: Insufficient documentation

## 2013-07-01 DIAGNOSIS — X500XXA Overexertion from strenuous movement or load, initial encounter: Secondary | ICD-10-CM | POA: Insufficient documentation

## 2013-07-01 DIAGNOSIS — Y929 Unspecified place or not applicable: Secondary | ICD-10-CM | POA: Insufficient documentation

## 2013-07-01 DIAGNOSIS — IMO0002 Reserved for concepts with insufficient information to code with codable children: Secondary | ICD-10-CM | POA: Insufficient documentation

## 2013-07-01 DIAGNOSIS — G8929 Other chronic pain: Secondary | ICD-10-CM

## 2013-07-01 DIAGNOSIS — Z7982 Long term (current) use of aspirin: Secondary | ICD-10-CM | POA: Insufficient documentation

## 2013-07-01 DIAGNOSIS — Y9389 Activity, other specified: Secondary | ICD-10-CM | POA: Insufficient documentation

## 2013-07-01 DIAGNOSIS — E119 Type 2 diabetes mellitus without complications: Secondary | ICD-10-CM | POA: Insufficient documentation

## 2013-07-01 DIAGNOSIS — Z8659 Personal history of other mental and behavioral disorders: Secondary | ICD-10-CM | POA: Insufficient documentation

## 2013-07-01 DIAGNOSIS — F17200 Nicotine dependence, unspecified, uncomplicated: Secondary | ICD-10-CM | POA: Insufficient documentation

## 2013-07-01 MED ORDER — HYDROMORPHONE HCL PF 1 MG/ML IJ SOLN
1.0000 mg | Freq: Once | INTRAMUSCULAR | Status: AC
  Administered 2013-07-01: 1 mg via INTRAMUSCULAR
  Filled 2013-07-01: qty 1

## 2013-07-01 MED ORDER — METHOCARBAMOL 500 MG PO TABS: 500.0000 mg | ORAL_TABLET | Freq: Two times a day (BID) | ORAL | Status: DC

## 2013-07-01 NOTE — ED Provider Notes (Signed)
Medical screening examination/treatment/procedure(s) were performed by non-physician practitioner and as supervising physician I was immediately available for consultation/collaboration.   Khadeeja Elden, MD 07/01/13 2308 

## 2013-07-01 NOTE — ED Notes (Signed)
Reports hx of back pain, injured it today when trying to help someone who was falling. Now having lower back pain that is radiating down right leg, ambulatory at triage.

## 2013-07-01 NOTE — ED Provider Notes (Signed)
CSN: [id number]     Arrival date & time 07/01/13  1723 History  This chart was scribed for non-physician practitioner Magnus Sinning, PA-C, working with Rolan Bucco, MD by Dorothey Baseman, ED Scribe. This patient was seen in room TR07C/TR07C and the patient's care was started at 7:07 PM.    Chief Complaint  Patient presents with  . Back Pain   The history is provided by the patient. No language interpreter was used.   HPI Comments: Kaylee Murphy is a 52 y.o. female with a history of back pain who presents to the Emergency Department complaining of a constant lower back pain that is exacerbated with movement onset PTA when the patient reports that she may have injured it while trying to help someone that was falling. Patient reports that she took an oxycodone at home without relief, but that it is usually effective at relieving her symptoms. She reports that the pain radiates down to the right leg with associated paresthesias. Denies saddle anaesthesia.  She denies loss of bowel or bladder function or fever. Patient reports a history of DM, HTN, and hyperlipidemia.   PCP- Dr. Roseanne Reno  Past Medical History  Diagnosis Date  . Diabetes mellitus   . Hypertension   . Hyperlipidemia   . Chronic shoulder pain 10 years    s/p multiple b/l rotator cuff repair sx - per MRI 04/2010 -  Delamination of the infraspinatus with mild fatty atrophysuggest distal tendon tear with medial propagation. Depending onthe hardware, CT arthrography may add information for evaluation othe rotator cuff.f   . Depression    Past Surgical History  Procedure Laterality Date  . Rotator cuff repair      Repair 2004, 2005 and 2007  . Rotator cuff repair  2004  . Rotator cuff repair      x3  . Breast surgery     Family History  Problem Relation Age of Onset  . Cancer Mother   . Stroke Father    History  Substance Use Topics  . Smoking status: Current Every Day Smoker -- 0.25 packs/day    Types: Cigarettes  .  Smokeless tobacco: Never Used     Comment: 5-6 cigs/day  . Alcohol Use: No   OB History   Grav Para Term Preterm Abortions TAB SAB Ect Mult Living                 Review of Systems  A complete 10 system review of systems was obtained and all systems are negative except as noted in the HPI and PMH.   Allergies  Cephalexin; Aspirin; Codeine; Ketorolac tromethamine; and Morphine and related  Home Medications   Current Outpatient Rx  Name  Route  Sig  Dispense  Refill  . aspirin EC 81 MG tablet   Oral   Take 81 mg by mouth daily.         Marland Kitchen lisinopril (PRINIVIL,ZESTRIL) 10 MG tablet   Oral   Take 10 mg by mouth daily.         . metFORMIN (GLUCOPHAGE) 500 MG tablet   Oral   Take 500 mg by mouth daily.          . OxyCODONE (OXYCONTIN) 20 mg T12A 12 hr tablet   Oral   Take 20 mg by mouth every 4 (four) hours.          Triage Vitals: BP 169/73  Pulse 78  Temp(Src) 98.6 F (37 C) (Oral)  Resp 18  Ht 5'  4" (1.626 m)  Wt 140 lb (63.504 kg)  BMI 24.02 kg/m2  SpO2 100%  Physical Exam  Nursing note and vitals reviewed. Constitutional: She is oriented to person, place, and time. She appears well-developed and well-nourished. No distress.  HENT:  Head: Normocephalic and atraumatic.  Eyes: Conjunctivae are normal.  Neck: Normal range of motion. Neck supple.  Cardiovascular: Normal rate, regular rhythm and normal heart sounds.   Pulmonary/Chest: Effort normal and breath sounds normal. No respiratory distress.  Abdominal: She exhibits no distension.  Musculoskeletal: Normal range of motion.       Lumbar back: She exhibits tenderness and bony tenderness. She exhibits no swelling, no edema and no deformity.  Tenderness to palpation to T and L spine.  Neurological: She is alert and oriented to person, place, and time. She has normal reflexes. Gait normal.  2+ patellar and achilles reflexes. Normal strength and sensation throughout.   Skin: Skin is warm and dry.   Psychiatric: She has a normal mood and affect. Her behavior is normal.    ED Course  Procedures (including critical care time)  DIAGNOSTIC STUDIES: Oxygen Saturation is 100% on room air, normal by my interpretation.    COORDINATION OF CARE: 7:11 PM- Discussed that symptoms are likely due to a muscle strain. Will discharge patient with a muscle relaxant to manage symptoms. Will order an injection of Dilaudid. Advised patient to continue taking her oxycodone at home and to apply ice to the area. Advised patient to follow up with her PCP or an orthopaedist, especially if there are any new or worsening symptoms. Discussed treatment plan with patient at bedside and patient verbalized agreement.     Labs Review Labs Reviewed - No data to display Imaging Review No results found.  EKG Interpretation   None       MDM  No diagnosis found. Patient with back pain.  No neurological deficits and normal neuro exam.  Patient can walk but states is painful.  No loss of bowel or bladder control.  No concern for cauda equina.  No fever, night sweats, weight loss, h/o cancer, IVDU.  RICE protocol and pain medicine indicated and discussed with patient.  Patient stable for discharge.  Return precautions given.  I personally performed the services described in this documentation, which was scribed in my presence. The recorded information has been reviewed and is accurate.     Pascal Lux Surrency, PA-C 07/01/13 2152

## 2013-07-10 ENCOUNTER — Emergency Department (HOSPITAL_COMMUNITY)
Admission: EM | Admit: 2013-07-10 | Discharge: 2013-07-10 | Discharge status: Against Medical Advice | Payer: Medicare Other | Attending: Emergency Medicine | Admitting: Emergency Medicine

## 2013-07-10 ENCOUNTER — Encounter (HOSPITAL_COMMUNITY): Payer: Self-pay | Admitting: Emergency Medicine

## 2013-07-10 DIAGNOSIS — M542 Cervicalgia: Secondary | ICD-10-CM | POA: Insufficient documentation

## 2013-07-10 DIAGNOSIS — E119 Type 2 diabetes mellitus without complications: Secondary | ICD-10-CM | POA: Insufficient documentation

## 2013-07-10 DIAGNOSIS — H9209 Otalgia, unspecified ear: Secondary | ICD-10-CM | POA: Insufficient documentation

## 2013-07-10 DIAGNOSIS — I10 Essential (primary) hypertension: Secondary | ICD-10-CM | POA: Insufficient documentation

## 2013-07-10 DIAGNOSIS — F17200 Nicotine dependence, unspecified, uncomplicated: Secondary | ICD-10-CM | POA: Insufficient documentation

## 2013-07-10 NOTE — ED Notes (Signed)
Pt states that last night she took her friend to [employer] and fall in parking lot and she tried to get her up. Now pt having left neck, head and left ear pain.

## 2013-07-10 NOTE — ED Notes (Signed)
Called to treatment area. Pt did not respond when her name was called

## 2013-07-10 NOTE — ED Notes (Signed)
Not in waiting room when called to treatment area

## 2013-07-16 ENCOUNTER — Encounter: Payer: Self-pay | Admitting: Gastroenterology

## 2013-08-22 ENCOUNTER — Emergency Department (HOSPITAL_COMMUNITY)
Admission: EM | Admit: 2013-08-22 | Discharge: 2013-08-22 | Disposition: A | Discharge status: Home / Self Care | Payer: Medicare Other | Attending: Emergency Medicine | Admitting: Emergency Medicine

## 2013-08-22 ENCOUNTER — Encounter (HOSPITAL_COMMUNITY): Payer: Self-pay | Admitting: Emergency Medicine

## 2013-08-22 DIAGNOSIS — Z8659 Personal history of other mental and behavioral disorders: Secondary | ICD-10-CM | POA: Insufficient documentation

## 2013-08-22 DIAGNOSIS — E119 Type 2 diabetes mellitus without complications: Secondary | ICD-10-CM | POA: Insufficient documentation

## 2013-08-22 DIAGNOSIS — M545 Low back pain, unspecified: Secondary | ICD-10-CM | POA: Insufficient documentation

## 2013-08-22 DIAGNOSIS — G8929 Other chronic pain: Secondary | ICD-10-CM | POA: Insufficient documentation

## 2013-08-22 DIAGNOSIS — F17200 Nicotine dependence, unspecified, uncomplicated: Secondary | ICD-10-CM | POA: Insufficient documentation

## 2013-08-22 DIAGNOSIS — I10 Essential (primary) hypertension: Secondary | ICD-10-CM | POA: Insufficient documentation

## 2013-08-22 DIAGNOSIS — Z79899 Other long term (current) drug therapy: Secondary | ICD-10-CM | POA: Insufficient documentation

## 2013-08-22 DIAGNOSIS — M549 Dorsalgia, unspecified: Secondary | ICD-10-CM

## 2013-08-22 HISTORY — DX: Other chronic pain: G89.29

## 2013-08-22 HISTORY — DX: Dorsalgia, unspecified: M54.9

## 2013-08-22 MED ORDER — ONDANSETRON 4 MG PO TBDP
8.0000 mg | ORAL_TABLET | Freq: Once | ORAL | Status: AC
  Administered 2013-08-22: 8 mg via ORAL
  Filled 2013-08-22: qty 2

## 2013-08-22 MED ORDER — HYDROMORPHONE HCL 2 MG PO TABS
1.0000 mg | ORAL_TABLET | Freq: Once | ORAL | Status: AC
  Administered 2013-08-22: 1 mg via ORAL
  Filled 2013-08-22: qty 1

## 2013-08-22 NOTE — Discharge Instructions (Signed)
Back Pain, Adult °Back pain is very common. The pain often gets better over time. The cause of back pain is usually not dangerous. Most people can learn to manage their back pain on their own.  °HOME CARE  °· Stay active. Start with short walks on flat ground if you can. Try to walk farther each day. °· Do not sit, drive, or stand in one place for more than 30 minutes. Do not stay in bed. °· Do not avoid exercise or work. Activity can help your back heal faster. °· Be careful when you bend or lift an object. Bend at your knees, keep the object close to you, and do not twist. °· Sleep on a firm mattress. Lie on your side, and bend your knees. If you lie on your back, put a pillow under your knees. °· Only take medicines as told by your doctor. °· Put ice on the injured area. °· Put ice in a plastic bag. °· Place a towel between your skin and the bag. °· Leave the ice on for 15-20 minutes, 03-04 times a day for the first 2 to 3 days. After that, you can switch between ice and heat packs. °· Ask your doctor about back exercises or massage. °· Avoid feeling anxious or stressed. Find good ways to deal with stress, such as exercise. °GET HELP RIGHT AWAY IF:  °· Your pain does not go away with rest or medicine. °· Your pain does not go away in 1 week. °· You have new problems. °· You do not feel well. °· The pain spreads into your legs. °· You cannot control when you poop (bowel movement) or pee (urinate). °· Your arms or legs feel weak or lose feeling (numbness). °· You feel sick to your stomach (nauseous) or throw up (vomit). °· You have belly (abdominal) pain. °· You feel like you may pass out (faint). °MAKE SURE YOU:  °· Understand these instructions. °· Will watch your condition. °· Will get help right away if you are not doing well or get worse. °Document Released: 02/16/2008 Document Revised: 11/22/2011 Document Reviewed: 01/18/2011 °[employer]® Patient Information ©2014 [employer]. ° °Chronic Back Pain ° When back  pain lasts longer than 3 months, it is called chronic back pain. People with chronic back pain often go through certain periods that are more intense (flare-ups).  °CAUSES °Chronic back pain can be caused by wear and tear (degeneration) on different structures in your back. These structures include: °· The bones of your spine (vertebrae) and the joints surrounding your spinal cord and nerve roots (facets). °· The strong, fibrous tissues that connect your vertebrae (ligaments). °Degeneration of these structures may result in pressure on your nerves. This can lead to constant pain. °HOME CARE INSTRUCTIONS °· Avoid bending, heavy lifting, prolonged sitting, and activities which make the problem worse. °· Take brief periods of rest throughout the day to reduce your pain. Lying down or standing usually is better than sitting while you are resting. °· Take over-the-counter or prescription medicines only as directed by your caregiver. °SEEK IMMEDIATE MEDICAL CARE IF:  °· You have weakness or numbness in one of your legs or feet. °· You have trouble controlling your bladder or bowels. °· You have nausea, vomiting, abdominal pain, shortness of breath, or fainting. °Document Released: 10/07/2004 Document Revised: 11/22/2011 Document Reviewed: 08/14/2011 °[employer]® Patient Information ©2014 [employer]. ° °

## 2013-08-22 NOTE — ED Provider Notes (Signed)
Medical screening examination/treatment/procedure(s) were performed by non-physician practitioner and as supervising physician I was immediately available for consultation/collaboration.  Toy Baker, MD 08/22/13 [phone]

## 2013-08-22 NOTE — ED Notes (Addendum)
Pt c/o back pain that started 2 days ago.  Pt denies injury.  Pt with hx of chronic back pain and shoulder pain.

## 2013-08-22 NOTE — ED Provider Notes (Signed)
CSN: [id number]     Arrival date & time 08/22/13  1314 History   First MD Initiated Contact with Patient 08/22/13 1344    This chart was scribed for Kaylee Silk  PA-C, a non-physician practitioner working with Toy Baker, MD by Lewanda Rife, ED Scribe. This patient was seen in room TR08C/TR08C and the patient's care was started at 1:59 PM     Chief Complaint  Patient presents with  . Back Pain   (Consider location/radiation/quality/duration/timing/severity/associated sxs/prior Treatment) The history is provided by the patient. No language interpreter was used.   HPI Comments: Kaylee Murphy is a 52 y.o. female who presents to the Emergency Department PMHx chronic back pain and shoulder pain complaining of constant moderate low back pain with exacerbation 2 days ago. Denies any precipitating factors. Describes pain as "pulling" and radiating down right leg. Denies associated new injury, fall, hematuria, and dysuria, and chills. Denies urinary or fecal incontinence, urinary retention, perineal/saddle paresthesias, fever, PMHx of cancer, and IV drug use. Reports taking oxycodone every 4 hours usually alleviates symptoms, with no relief of symptoms today.    Past Medical History  Diagnosis Date  . Diabetes mellitus   . Hypertension   . Hyperlipidemia   . Chronic shoulder pain 10 years    s/p multiple b/l rotator cuff repair sx - per MRI 04/2010 -  Delamination of the infraspinatus with mild fatty atrophysuggest distal tendon tear with medial propagation. Depending onthe hardware, CT arthrography may add information for evaluation othe rotator cuff.f   . Depression   . Chronic back pain    Past Surgical History  Procedure Laterality Date  . Rotator cuff repair      Repair 2004, 2005 and 2007  . Rotator cuff repair  2004  . Rotator cuff repair      x3  . Breast surgery     Family History  Problem Relation Age of Onset  . Cancer Mother   . Stroke Father    History   Substance Use Topics  . Smoking status: Current Every Day Smoker -- 0.25 packs/day    Types: Cigarettes  . Smokeless tobacco: Never Used     Comment: 5-6 cigs/day  . Alcohol Use: No   OB History   Grav Para Term Preterm Abortions TAB SAB Ect Mult Living                 Review of Systems  Musculoskeletal: Positive for back pain.   A complete 10 system review of systems was obtained and all systems are negative except as noted in the HPI and PMHx.     Allergies  Cephalexin; Aspirin; Codeine; Ketorolac tromethamine; and Morphine and related  Home Medications   Current Outpatient Rx  Name  Route  Sig  Dispense  Refill  . lisinopril (PRINIVIL,ZESTRIL) 10 MG tablet   Oral   Take 10 mg by mouth daily.         . metFORMIN (GLUCOPHAGE) 500 MG tablet   Oral   Take 500 mg by mouth daily.          . OxyCODONE (OXYCONTIN) 20 mg T12A 12 hr tablet   Oral   Take 20 mg by mouth every 4 (four) hours.           BP 137/96  Pulse 85  Temp(Src) 98.8 F (37.1 C) (Oral)  Resp 17  Ht 5\' 5"  (1.651 m)  Wt 140 lb 2 oz (63.56 kg)  BMI 23.32 kg/m2  SpO2  99%  LMP 08/01/2013 Physical Exam  Nursing note and vitals reviewed. Constitutional: She is oriented to person, place, and time. She appears well-developed and well-nourished. No distress.  HENT:  Head: Normocephalic and atraumatic.  Right Ear: External ear normal.  Left Ear: External ear normal.  Nose: Nose normal.  Mouth/Throat: Oropharynx is clear and moist.  Eyes: Conjunctivae are normal.  Neck: Normal range of motion.  Cardiovascular: Normal rate, regular rhythm, normal heart sounds, intact distal pulses and normal pulses.   Pulses:      Radial pulses are 2+ on the right side, and 2+ on the left side.       Posterior tibial pulses are 2+ on the right side, and 2+ on the left side.  Pulmonary/Chest: Effort normal and breath sounds normal. No stridor. No respiratory distress. She has no wheezes. She has no rales.   Abdominal: Soft. She exhibits no distension.  Musculoskeletal: Normal range of motion.       Back:  No midline C-spine, or T-spine, tenderness with no step-offs or deformities noted   TTP over L-spine    Neurological: She is alert and oriented to person, place, and time. She has normal strength. No sensory deficit.  Straight leg raise positive bilaterally    Skin: Skin is warm and dry. She is not diaphoretic. No erythema.  Psychiatric: She has a normal mood and affect. Her behavior is normal.    ED Course  Procedures   COORDINATION OF CARE:  Nursing notes reviewed. Vital signs reviewed. Initial pt interview and examination performed.  Treatment plan initiated: Medications  ondansetron (ZOFRAN-ODT) disintegrating tablet 8 mg (8 mg Oral Given 08/22/13 1438)  HYDROmorphone (DILAUDID) tablet 1 mg (1 mg Oral Given 08/22/13 1437)     Initial diagnostic testing ordered.    Labs Review Labs Reviewed - No data to display Imaging Review No results found.  EKG Interpretation   None       MDM   1. Back pain    Patient with back pain.  No neurological deficits and normal neuro exam.  Patient can walk but states is painful.  No loss of bowel or bladder control.  No concern for cauda equina.  No fever, night sweats, weight loss, h/o cancer, IVDU.  RICE protocol and pain medicine indicated and discussed with patient. No rx for home as she is a patient of pain management.    I personally performed the services described in this documentation, which was scribed in my presence. The recorded information has been reviewed and is accurate.       Mora Bellman, PA-C 08/22/13 [phone]

## 2013-08-28 ENCOUNTER — Encounter (HOSPITAL_COMMUNITY): Payer: Self-pay | Admitting: Emergency Medicine

## 2013-08-28 ENCOUNTER — Emergency Department (HOSPITAL_COMMUNITY)
Admission: EM | Admit: 2013-08-28 | Discharge: 2013-08-28 | Disposition: A | Discharge status: Home / Self Care | Payer: Medicare Other | Attending: Emergency Medicine | Admitting: Emergency Medicine

## 2013-08-28 DIAGNOSIS — M25519 Pain in unspecified shoulder: Secondary | ICD-10-CM | POA: Insufficient documentation

## 2013-08-28 DIAGNOSIS — G8929 Other chronic pain: Secondary | ICD-10-CM | POA: Insufficient documentation

## 2013-08-28 DIAGNOSIS — R209 Unspecified disturbances of skin sensation: Secondary | ICD-10-CM | POA: Insufficient documentation

## 2013-08-28 DIAGNOSIS — Z8659 Personal history of other mental and behavioral disorders: Secondary | ICD-10-CM | POA: Insufficient documentation

## 2013-08-28 DIAGNOSIS — Z9889 Other specified postprocedural states: Secondary | ICD-10-CM | POA: Insufficient documentation

## 2013-08-28 DIAGNOSIS — M79609 Pain in unspecified limb: Secondary | ICD-10-CM | POA: Insufficient documentation

## 2013-08-28 DIAGNOSIS — I10 Essential (primary) hypertension: Secondary | ICD-10-CM | POA: Insufficient documentation

## 2013-08-28 DIAGNOSIS — F17200 Nicotine dependence, unspecified, uncomplicated: Secondary | ICD-10-CM | POA: Insufficient documentation

## 2013-08-28 DIAGNOSIS — E119 Type 2 diabetes mellitus without complications: Secondary | ICD-10-CM | POA: Insufficient documentation

## 2013-08-28 DIAGNOSIS — M545 Low back pain, unspecified: Secondary | ICD-10-CM | POA: Insufficient documentation

## 2013-08-28 MED ORDER — DIAZEPAM 5 MG PO TABS: 5.0000 mg | ORAL_TABLET | Freq: Four times a day (QID) | ORAL | Status: DC | PRN

## 2013-08-28 MED ORDER — HYDROMORPHONE HCL PF 1 MG/ML IJ SOLN
1.0000 mg | Freq: Once | INTRAMUSCULAR | Status: AC
  Administered 2013-08-28: 1 mg via INTRAMUSCULAR
  Filled 2013-08-28: qty 1

## 2013-08-28 MED ORDER — DIAZEPAM 5 MG PO TABS
5.0000 mg | ORAL_TABLET | Freq: Once | ORAL | Status: AC
  Administered 2013-08-28: 5 mg via ORAL
  Filled 2013-08-28: qty 1

## 2013-08-28 NOTE — ED Provider Notes (Signed)
CSN: [id number]     Arrival date & time 08/28/13  1103 History  This chart was scribed for non-physician practitioner Denver Faster, PA-C working with Ward Givens, MD by Clydene Laming, ED Scribe. This patient was seen in room TR09C/TR09C and the patient's care was started at 11:22 AM.   Chief Complaint  Patient presents with  . Back Pain    The history is provided by the patient. No language interpreter was used.    HPI Comments: Kaylee Murphy is a 52 y.o. female who presents to the Emergency Department complaining of chronic lower back pain onset two weeks ago. Pt states the pain is constant no matter what she is doing although it is worsened by laying down. She also reports associated numbness of her right leg. Pt states she has three pinched nerves in her back and also has a hx of rotator cuff repair. Pt denies any urinary symptoms, nausea, or vomiting. Pt was seen here 6 days ago for the same symptoms. Pt states she was given a small tablet for pain which did not provide relief. She has an appointment this upcoming Friday with her doctor. Pt is on Oxycodone 20mg  prescribed by Dr. Karl Luke. Pt states use of muscle relaxer's make her fell nauseated, she has not tried valium. Pt has tried applying ice and heat and she does not get relief from either. She also tries to walk the pain off without relief.    Past Medical History  Diagnosis Date  . Diabetes mellitus   . Hypertension   . Hyperlipidemia   . Chronic shoulder pain 10 years    s/p multiple b/l rotator cuff repair sx - per MRI 04/2010 -  Delamination of the infraspinatus with mild fatty atrophysuggest distal tendon tear with medial propagation. Depending onthe hardware, CT arthrography may add information for evaluation othe rotator cuff.f   . Depression   . Chronic back pain    Past Surgical History  Procedure Laterality Date  . Rotator cuff repair      Repair 2004, 2005 and 2007  . Rotator cuff repair  2004  . Rotator cuff  repair      x3  . Breast surgery     Family History  Problem Relation Age of Onset  . Cancer Mother   . Stroke Father    History  Substance Use Topics  . Smoking status: Current Every Day Smoker -- 0.25 packs/day    Types: Cigarettes  . Smokeless tobacco: Never Used     Comment: 5-6 cigs/day  . Alcohol Use: No   OB History   Grav Para Term Preterm Abortions TAB SAB Ect Mult Living                 Review of Systems  Constitutional: Negative for fever and chills.  Gastrointestinal: Negative for nausea and vomiting.  Genitourinary: Negative for urgency, frequency and decreased urine volume.  Musculoskeletal: Positive for back pain.  Neurological: Positive for numbness.    Allergies  Cephalexin; Aspirin; Codeine; Ketorolac tromethamine; and Morphine and related  Home Medications   Current Outpatient Rx  Name  Route  Sig  Dispense  Refill  . lisinopril (PRINIVIL,ZESTRIL) 10 MG tablet   Oral   Take 10 mg by mouth daily.         . metFORMIN (GLUCOPHAGE) 500 MG tablet   Oral   Take 500 mg by mouth daily.          . OxyCODONE (OXYCONTIN) 20 mg T12A  12 hr tablet   Oral   Take 20 mg by mouth every 4 (four) hours.           Triage Vitals:BP 138/87  Pulse 77  Temp(Src) 97.8 F (36.6 C) (Oral)  Resp 18  Ht 5\' 5"  (1.651 m)  Wt 137 lb 9.6 oz (62.415 kg)  BMI 22.90 kg/m2  SpO2 100%  LMP 08/01/2013 Physical Exam  Nursing note and vitals reviewed. Constitutional: She is oriented to person, place, and time. She appears well-developed and well-nourished. No distress.  HENT:  Head: Normocephalic and atraumatic.  Eyes: EOM are normal.  Neck: Neck supple.  Cardiovascular: Normal rate.   Pulmonary/Chest: Effort normal. No respiratory distress.  Musculoskeletal: Normal range of motion.  midline lumbar spine tenderness Lumbar peratubal spine tenderness with muscle spasms Pain with bilateral straight leg raises   Neurological: She is alert and oriented to person,  place, and time.  5/5 and equal lower extremity strength. 2+ and equal patellar reflexes bilaterally. Pt able to dorsiflex bilateral toes and feet with good strength against resistance.    Skin: Skin is warm and dry.  Psychiatric: She has a normal mood and affect. Her behavior is normal.    ED Course  Procedures (including critical care time) DIAGNOSTIC STUDIES: Oxygen Saturation is 100% on RA, normal by my interpretation.    COORDINATION OF CARE: 11:30 AM- Discussed treatment plan with pt at bedside. Pt verbalized understanding and agreement with plan.   Labs Review Labs Reviewed - No data to display Imaging Review No results found.  EKG Interpretation   None       MDM   1. Chronic back pain     Pt with lower back pain that is chronic. Neurovascularly intact. No new injuries. No signs of cauda equina. She has an appointment with primary care Dr. in 3 days. She is already taking oxycodone 20 mg every 12 hours for pain. States she has medicines at home. We'll add Valium. I suspect she probably does have muscle spasms given exam findings. Also instructed her to try to get massage and brought her back with a ball roller. She'll followup with primary care Dr.  Ceasar Mons Vitals:   08/28/13 1107  BP: 138/87  Pulse: 77  Temp: 97.8 F (36.6 C)  TempSrc: Oral  Resp: 18  Height: 5\' 5"  (1.651 m)  Weight: 137 lb 9.6 oz (62.415 kg)  SpO2: 100%     I personally performed the services described in this documentation, which was scribed in my presence. The recorded information has been reviewed and is accurate.     Lottie Mussel, PA-C 08/28/13 1601

## 2013-08-28 NOTE — ED Notes (Signed)
Reports left side lower back pain x 2 weeks, increases with movement. Denies any urinary symptoms.

## 2013-08-29 NOTE — ED Provider Notes (Signed)
Medical screening examination/treatment/procedure(s) were performed by non-physician practitioner and as supervising physician I was immediately available for consultation/collaboration.  EKG Interpretation   None       Devoria Albe, MD, Armando Gang   Ward Givens, MD 08/29/13 [phone]

## 2013-09-22 ENCOUNTER — Emergency Department (HOSPITAL_COMMUNITY)
Admission: EM | Admit: 2013-09-22 | Discharge: 2013-09-22 | Disposition: A | Discharge status: Home / Self Care | Payer: Medicare Other | Attending: Emergency Medicine | Admitting: Emergency Medicine

## 2013-09-22 ENCOUNTER — Encounter (HOSPITAL_COMMUNITY): Payer: Self-pay | Admitting: Emergency Medicine

## 2013-09-22 DIAGNOSIS — Z8659 Personal history of other mental and behavioral disorders: Secondary | ICD-10-CM | POA: Insufficient documentation

## 2013-09-22 DIAGNOSIS — M545 Low back pain, unspecified: Secondary | ICD-10-CM | POA: Insufficient documentation

## 2013-09-22 DIAGNOSIS — G8929 Other chronic pain: Secondary | ICD-10-CM | POA: Insufficient documentation

## 2013-09-22 DIAGNOSIS — I10 Essential (primary) hypertension: Secondary | ICD-10-CM | POA: Insufficient documentation

## 2013-09-22 DIAGNOSIS — R630 Anorexia: Secondary | ICD-10-CM | POA: Insufficient documentation

## 2013-09-22 DIAGNOSIS — E119 Type 2 diabetes mellitus without complications: Secondary | ICD-10-CM | POA: Insufficient documentation

## 2013-09-22 DIAGNOSIS — Z8639 Personal history of other endocrine, nutritional and metabolic disease: Secondary | ICD-10-CM | POA: Insufficient documentation

## 2013-09-22 DIAGNOSIS — Z79899 Other long term (current) drug therapy: Secondary | ICD-10-CM | POA: Insufficient documentation

## 2013-09-22 DIAGNOSIS — R209 Unspecified disturbances of skin sensation: Secondary | ICD-10-CM | POA: Insufficient documentation

## 2013-09-22 DIAGNOSIS — F17200 Nicotine dependence, unspecified, uncomplicated: Secondary | ICD-10-CM | POA: Insufficient documentation

## 2013-09-22 DIAGNOSIS — Z862 Personal history of diseases of the blood and blood-forming organs and certain disorders involving the immune mechanism: Secondary | ICD-10-CM | POA: Insufficient documentation

## 2013-09-22 MED ORDER — OXYCODONE-ACETAMINOPHEN 5-325 MG PO TABS
2.0000 | ORAL_TABLET | Freq: Once | ORAL | Status: AC
  Administered 2013-09-22: 2 via ORAL
  Filled 2013-09-22: qty 2

## 2013-09-22 NOTE — ED Notes (Signed)
Pt c/o severe lower back pain x 3 days. History of chronic back pain but states its worse now and unrelieved by ibuprofen and oxycodone at home. Denies bowel/bladder changes. Ambulatory, mae

## 2013-09-22 NOTE — ED Provider Notes (Signed)
CSN: [id number]     Arrival date & time 09/22/13  1024 History  This chart was scribed for non-physician practitioner, Clayton Bibles PA-C, working with Veryl Speak, MD by Roe Coombs, ED Scribe. This patient was seen in room TR11C/TR11C and the patient's care was started at 11:34 AM.     Chief Complaint  Patient presents with  . Back Pain   The history is provided by the patient. No language interpreter was used.    HPI Comments: Kaylee Murphy is a 53 y.o. female with history of chronic back pain with three pinched nerves of lumbar spine who presents to the Emergency Department complaining of constant, lower back pain onset 3 days ago. Pain radiates down both legs and is worse in her right leg. She describes pain as sharp and rates it as 10/10. Pain is worse with standing and weight bearing. She says that pain is consistent in location and character with her prior history, but more severe. She usually has exacerbations about once a month. She denies any recent injury or trauma. She is taking ibuprofen and oxycodone at home without relief and has tried Valium in the past as well. Her last MRI was 1 year ago. She sometimes wears a back brace and occasionally uses a walker to ambulate.There is associated tingling in her left upper leg which is not new. She says that in the last few days she has also been dragging her foot, but denies any weakness in her lower extremities. She reports decreased appetite. She denies bowel incontinence, bladder incontinence, saddle anesthesia, fever, nausea, vomiting or diarrhea. Patient's back pain is followed by her PCP, Dr. Sheryle Hail and she is on a pain contract with him. She is not regularly followed by a neurologist. She has never had any back surgeries. She has no history of cancer. Her other medical history includes DM, HTN, hyperlipidemia, chronic shoulder pain, and depression.    Past Medical History  Diagnosis Date  . Diabetes mellitus   . Hypertension   .  Hyperlipidemia   . Chronic shoulder pain 10 years    s/p multiple b/l rotator cuff repair sx - per MRI 04/2010 -  Delamination of the infraspinatus with mild fatty atrophysuggest distal tendon tear with medial propagation. Depending onthe hardware, CT arthrography may add information for evaluation othe rotator cuff.f   . Depression   . Chronic back pain    Past Surgical History  Procedure Laterality Date  . Rotator cuff repair      Repair 2004, 2005 and 2007  . Rotator cuff repair  2004  . Rotator cuff repair      x3  . Breast surgery     Family History  Problem Relation Age of Onset  . Cancer Mother   . Stroke Father    History  Substance Use Topics  . Smoking status: Current Every Day Smoker -- 0.25 packs/day    Types: Cigarettes  . Smokeless tobacco: Never Used     Comment: 5-6 cigs/day  . Alcohol Use: No   OB History   Grav Para Term Preterm Abortions TAB SAB Ect Mult Living                 Review of Systems  Constitutional: Positive for activity change. Negative for fever and chills.  Gastrointestinal: Negative for nausea and vomiting.  Genitourinary:       Negative for bladder incontinence. Negative for bowel incontinence.  Musculoskeletal: Positive for back pain.  Skin: Negative for pallor.  Neurological:       Positive for tingling in her left upper leg.    Allergies  Cephalexin; Aspirin; Codeine; Ketorolac tromethamine; and Morphine and related  Home Medications   Current Outpatient Rx  Name  Route  Sig  Dispense  Refill  . diazepam (VALIUM) 5 MG tablet   Oral   Take 5 mg by mouth every 6 (six) hours as needed for muscle spasms.         Marland Kitchen ibuprofen (ADVIL,MOTRIN) 200 MG tablet   Oral   Take 400 mg by mouth once.         Marland Kitchen lisinopril (PRINIVIL,ZESTRIL) 10 MG tablet   Oral   Take 10 mg by mouth daily.         . OxyCODONE (OXYCONTIN) 20 mg T12A 12 hr tablet   Oral   Take 20 mg by mouth every 4 (four) hours.           Triage Vitals:  BP 128/79  Pulse 80  Temp(Src) 97.4 F (36.3 C) (Oral)  Resp 16  Ht 5\' 8"  (1.727 m)  Wt 130 lb (58.968 kg)  BMI 19.77 kg/m2  SpO2 100% Physical Exam  Nursing note and vitals reviewed. Constitutional: She is oriented to person, place, and time. She appears well-developed and well-nourished. No distress.  HENT:  Head: Normocephalic and atraumatic.  Eyes: EOM are normal.  Neck: Neck supple. No tracheal deviation present.  Cardiovascular: Normal rate.   Pulmonary/Chest: Effort normal. No respiratory distress.  Abdominal: Soft. She exhibits no distension. There is no tenderness. There is no rebound and no guarding.  Musculoskeletal:  Diffuse tenderness of the lower back. Spine nontender, no crepitus, or stepoffs.  Lower extremities:  Strength 5/5, sensation intact, distal pulses intact.  Neurological: She is alert and oriented to person, place, and time.  Gait is normal. No foot dragging bilaterally.  Skin: Skin is warm and dry.  Psychiatric: She has a normal mood and affect. Her behavior is normal.    ED Course  Procedures (including critical care time) DIAGNOSTIC STUDIES: Oxygen Saturation is 100% on room air, normal by my interpretation.    COORDINATION OF CARE: 11:43 AM- Patient informed of current plan for treatment and evaluation and agrees with plan at this time.      MDM   1. Chronic low back pain    Pt with exacerbation of chronic back pain without injury.  Neurovascularly intact.  No red flags. She is not dragging her foot while walking and has good strength in bilateral legs.  Pt is on pain contract.  No need for emergent imaging at this time.  Given two 5-325 percocets in ED. D/C home.  Discussed  findings, treatment, and follow up  with patient.  Pt given return precautions.  Pt verbalizes understanding and agrees with plan.      I personally performed the services described in this documentation, which was scribed in my presence. The recorded information has  been reviewed and is accurate.    Riggins, PA-C 09/22/13 1621

## 2013-09-22 NOTE — Discharge Instructions (Signed)
Read the information below.  You may return to the Emergency Department at any time for worsening condition or any new symptoms that concern you.  If you develop fevers, loss of control of bowel or bladder, weakness or numbness in your legs, or are unable to walk, return to the ER for a recheck.   Chronic Back Pain  When back pain lasts longer than 3 months, it is called chronic back pain.People with chronic back pain often go through certain periods that are more intense (flare-ups).  CAUSES Chronic back pain can be caused by wear and tear (degeneration) on different structures in your back. These structures include:  The bones of your spine (vertebrae) and the joints surrounding your spinal cord and nerve roots (facets).  The strong, fibrous tissues that connect your vertebrae (ligaments). Degeneration of these structures may result in pressure on your nerves. This can lead to constant pain. HOME CARE INSTRUCTIONS  Avoid bending, heavy lifting, prolonged sitting, and activities which make the problem worse.  Take brief periods of rest throughout the day to reduce your pain. Lying down or standing usually is better than sitting while you are resting.  Take over-the-counter or prescription medicines only as directed by your caregiver. SEEK IMMEDIATE MEDICAL CARE IF:   You have weakness or numbness in one of your legs or feet.  You have trouble controlling your bladder or bowels.  You have nausea, vomiting, abdominal pain, shortness of breath, or fainting. Document Released: 10/07/2004 Document Revised: 11/22/2011 Document Reviewed: 08/14/2011 [facility] Patient Information 2014 [place], [place].

## 2013-09-23 ENCOUNTER — Emergency Department (HOSPITAL_COMMUNITY)
Admission: EM | Admit: 2013-09-23 | Discharge: 2013-09-24 | Disposition: A | Discharge status: Home / Self Care | Payer: Medicare Other | Attending: Emergency Medicine | Admitting: Emergency Medicine

## 2013-09-23 ENCOUNTER — Encounter (HOSPITAL_COMMUNITY): Payer: Self-pay | Admitting: Emergency Medicine

## 2013-09-23 ENCOUNTER — Emergency Department (HOSPITAL_COMMUNITY): Payer: Medicare Other

## 2013-09-23 ENCOUNTER — Emergency Department (HOSPITAL_COMMUNITY)
Admission: EM | Admit: 2013-09-23 | Discharge: 2013-09-23 | Discharge status: Against Medical Advice | Payer: Medicare Other | Source: Home / Self Care

## 2013-09-23 DIAGNOSIS — M542 Cervicalgia: Secondary | ICD-10-CM | POA: Insufficient documentation

## 2013-09-23 DIAGNOSIS — F3289 Other specified depressive episodes: Secondary | ICD-10-CM | POA: Insufficient documentation

## 2013-09-23 DIAGNOSIS — F329 Major depressive disorder, single episode, unspecified: Secondary | ICD-10-CM | POA: Insufficient documentation

## 2013-09-23 DIAGNOSIS — R110 Nausea: Secondary | ICD-10-CM | POA: Insufficient documentation

## 2013-09-23 DIAGNOSIS — M549 Dorsalgia, unspecified: Secondary | ICD-10-CM

## 2013-09-23 DIAGNOSIS — R079 Chest pain, unspecified: Secondary | ICD-10-CM

## 2013-09-23 DIAGNOSIS — F17200 Nicotine dependence, unspecified, uncomplicated: Secondary | ICD-10-CM | POA: Insufficient documentation

## 2013-09-23 DIAGNOSIS — E785 Hyperlipidemia, unspecified: Secondary | ICD-10-CM | POA: Insufficient documentation

## 2013-09-23 DIAGNOSIS — R071 Chest pain on breathing: Secondary | ICD-10-CM | POA: Insufficient documentation

## 2013-09-23 DIAGNOSIS — E119 Type 2 diabetes mellitus without complications: Secondary | ICD-10-CM

## 2013-09-23 DIAGNOSIS — M545 Low back pain, unspecified: Secondary | ICD-10-CM | POA: Insufficient documentation

## 2013-09-23 DIAGNOSIS — R0789 Other chest pain: Secondary | ICD-10-CM

## 2013-09-23 DIAGNOSIS — G8929 Other chronic pain: Secondary | ICD-10-CM | POA: Insufficient documentation

## 2013-09-23 DIAGNOSIS — R0602 Shortness of breath: Secondary | ICD-10-CM | POA: Insufficient documentation

## 2013-09-23 DIAGNOSIS — Z79899 Other long term (current) drug therapy: Secondary | ICD-10-CM | POA: Insufficient documentation

## 2013-09-23 DIAGNOSIS — I10 Essential (primary) hypertension: Secondary | ICD-10-CM

## 2013-09-23 DIAGNOSIS — Z9889 Other specified postprocedural states: Secondary | ICD-10-CM | POA: Insufficient documentation

## 2013-09-23 LAB — CBC
HCT: 31.1 % — ABNORMAL LOW (ref 36.0–46.0)
HCT: 32.4 % — ABNORMAL LOW (ref 36.0–46.0)
HEMOGLOBIN: 10.5 g/dL — AB (ref 12.0–15.0)
Hemoglobin: 10.1 g/dL — ABNORMAL LOW (ref 12.0–15.0)
MCH: 26.9 pg (ref 26.0–34.0)
MCH: 26.5 pg (ref 26.0–34.0)
MCHC: 32.5 g/dL (ref 30.0–36.0)
MCHC: 32.4 g/dL (ref 30.0–36.0)
MCV: 82.9 fL (ref 78.0–100.0)
MCV: 81.8 fL (ref 78.0–100.0)
Platelets: 304 10*3/uL (ref 150–400)
Platelets: 312 10*3/uL (ref 150–400)
RBC: 3.75 MIL/uL — ABNORMAL LOW (ref 3.87–5.11)
RBC: 3.96 MIL/uL (ref 3.87–5.11)
RDW: 15.1 % (ref 11.5–15.5)
RDW: 15 % (ref 11.5–15.5)
WBC: 12.4 10*3/uL — ABNORMAL HIGH (ref 4.0–10.5)
WBC: 13 10*3/uL — ABNORMAL HIGH (ref 4.0–10.5)

## 2013-09-23 LAB — BASIC METABOLIC PANEL
BUN: 14 mg/dL (ref 6–23)
CALCIUM: 8.4 mg/dL (ref 8.4–10.5)
CO2: 24 mEq/L (ref 19–32)
CREATININE: 0.72 mg/dL (ref 0.50–1.10)
Chloride: 104 mEq/L (ref 96–112)
GFR calc Af Amer: >90 mL/min (ref >90)
Glucose, Bld: 123 mg/dL — ABNORMAL HIGH (ref 70–99)
Potassium: 3.9 mEq/L (ref 3.7–5.3)
Sodium: 140 mEq/L (ref 137–147)

## 2013-09-23 LAB — PRO B NATRIURETIC PEPTIDE: Pro B Natriuretic peptide (BNP): 108.1 pg/mL (ref 0–125)

## 2013-09-23 LAB — COMPREHENSIVE METABOLIC PANEL
ALT: 9 U/L (ref 0–35)
AST: 13 U/L (ref 0–37)
Albumin: 3.5 g/dL (ref 3.5–5.2)
Alkaline Phosphatase: 69 U/L (ref 39–117)
BUN: 12 mg/dL (ref 6–23)
CALCIUM: 8.6 mg/dL (ref 8.4–10.5)
CO2: 24 meq/L (ref 19–32)
Chloride: 103 mEq/L (ref 96–112)
Creatinine, Ser: 0.76 mg/dL (ref 0.50–1.10)
GLUCOSE: 83 mg/dL (ref 70–99)
Potassium: 3.2 mEq/L — ABNORMAL LOW (ref 3.7–5.3)
SODIUM: 139 meq/L (ref 137–147)
Total Bilirubin: <0.2 mg/dL — ABNORMAL LOW (ref 0.3–1.2)
Total Protein: 7.4 g/dL (ref 6.0–8.3)

## 2013-09-23 LAB — URINALYSIS, ROUTINE W REFLEX MICROSCOPIC
BILIRUBIN URINE: NEGATIVE
GLUCOSE, UA: NEGATIVE mg/dL
HGB URINE DIPSTICK: NEGATIVE
Ketones, ur: NEGATIVE mg/dL
Leukocytes, UA: NEGATIVE
Nitrite: NEGATIVE
PROTEIN: NEGATIVE mg/dL
Specific Gravity, Urine: 1.012 (ref 1.005–1.030)
Urobilinogen, UA: 0.2 mg/dL (ref 0.0–1.0)
pH: 6.5 (ref 5.0–8.0)

## 2013-09-23 LAB — POCT I-STAT TROPONIN I
TROPONIN I, POC: 0 ng/mL (ref 0.00–0.08)
Troponin i, poc: 0 ng/mL (ref 0.00–0.08)

## 2013-09-23 MED ORDER — ASPIRIN 81 MG PO CHEW
324.0000 mg | CHEWABLE_TABLET | Freq: Once | ORAL | Status: AC
  Administered 2013-09-23: 324 mg via ORAL
  Filled 2013-09-23: qty 4

## 2013-09-23 MED ORDER — ONDANSETRON 8 MG PO TBDP
8.0000 mg | ORAL_TABLET | Freq: Once | ORAL | Status: AC
  Administered 2013-09-23: 8 mg via ORAL
  Filled 2013-09-23: qty 1

## 2013-09-23 MED ORDER — POTASSIUM CHLORIDE CRYS ER 20 MEQ PO TBCR
40.0000 meq | EXTENDED_RELEASE_TABLET | Freq: Once | ORAL | Status: AC
  Administered 2013-09-23: 40 meq via ORAL
  Filled 2013-09-23: qty 2

## 2013-09-23 MED ORDER — FENTANYL CITRATE 0.05 MG/ML IJ SOLN
50.0000 ug | Freq: Once | INTRAMUSCULAR | Status: AC
Start: 2013-09-23 — End: 2013-09-23
  Administered 2013-09-23: 50 ug via INTRAVENOUS
  Filled 2013-09-23: qty 2

## 2013-09-23 NOTE — ED Provider Notes (Signed)
CSN: [id number]     Arrival date & time 09/23/13  2049 History   First MD Initiated Contact with Patient 09/23/13 2053     Chief Complaint  Patient presents with  . Chest Pain  . Shortness of Breath  . Back Pain   (Consider location/radiation/quality/duration/timing/severity/associated sxs/prior Treatment) The history is provided by the patient and medical records. No language interpreter was used.    Kaylee Murphy is a 53 y.o. female  with a hx of HTN, NIDDM, chronic shoulder and back pain presents to the Emergency Department complaining of gradual, persistent, progressively worsening substernal chest pain described as pressure onset 3pm today while watching TV.  Pt reports a Hx of CP similar today with negative work ups. Pt denies ever seeing a cardiologist.  Pt reports Percocet and 2 ibuprofen before coming to the ED without relief.  Pt reports she initially went to [facility], but left after a 2 hour wait.  Associated symptoms include mild nausea, SOB, paresthesia of the right.  Laying still makes it better and moving around makes it worse.  Pt denies fever, chills, headache, neck pain, abd pain, vomiting, diarrhea, weakness, dizziness, syncope, dysuria.  Pt reports getting a flu shot, but not having any URI ssx including cough lately.   Pt also reports flare of her back pain.  Known trigger and patient denies fall. She denies numbness, tingling, weakness, gait disturbance, loss of bowel or bladder control, saddle anesthesia.  Patient reports she has a history of chronic back pain with pinched nerves both in her back and her neck. She states she has a flare about once per month.  She reports she was seen at [facility] for this. Record review shows the patient has been seen 10 times in the last 6 months her low back pain. She is on a pain contract with her physician.   Past Medical History  Diagnosis Date  . Diabetes mellitus   . Hypertension   . Hyperlipidemia   . Chronic shoulder pain 10  years    s/p multiple b/l rotator cuff repair sx - per MRI 04/2010 -  Delamination of the infraspinatus with mild fatty atrophysuggest distal tendon tear with medial propagation. Depending onthe hardware, CT arthrography may add information for evaluation othe rotator cuff.f   . Depression   . Chronic back pain    Past Surgical History  Procedure Laterality Date  . Rotator cuff repair      Repair 2004, 2005 and 2007  . Rotator cuff repair  2004  . Rotator cuff repair      x3  . Breast surgery     Family History  Problem Relation Age of Onset  . Cancer Mother   . Stroke Father    History  Substance Use Topics  . Smoking status: Current Every Day Smoker -- 0.25 packs/day    Types: Cigarettes  . Smokeless tobacco: Never Used     Comment: 5-6 cigs/day  . Alcohol Use: No   OB History   Grav Para Term Preterm Abortions TAB SAB Ect Mult Living                 Review of Systems  Constitutional: Negative for fever, diaphoresis, appetite change, fatigue and unexpected weight change.  HENT: Negative for mouth sores.   Eyes: Negative for visual disturbance.  Respiratory: Positive for chest tightness and shortness of breath. Negative for cough and wheezing.   Cardiovascular: Positive for chest pain.  Gastrointestinal: Positive for nausea. Negative for  vomiting, abdominal pain, diarrhea and constipation.  Endocrine: Negative for polydipsia, polyphagia and polyuria.  Genitourinary: Negative for dysuria, urgency, frequency and hematuria.  Musculoskeletal: Positive for back pain. Negative for gait problem, joint swelling, neck pain and neck stiffness.  Skin: Negative for rash.  Allergic/Immunologic: Negative for immunocompromised state.  Neurological: Negative for syncope, weakness, light-headedness, numbness and headaches.  Hematological: Does not bruise/bleed easily.  Psychiatric/Behavioral: Negative for sleep disturbance. The patient is not nervous/anxious.   All other systems  reviewed and are negative.    Allergies  Cephalexin; Aspirin; Codeine; Ketorolac tromethamine; and Morphine and related  Home Medications   Current Outpatient Rx  Name  Route  Sig  Dispense  Refill  . diazepam (VALIUM) 5 MG tablet   Oral   Take 5 mg by mouth every 6 (six) hours as needed for muscle spasms.         Marland Kitchen ibuprofen (ADVIL,MOTRIN) 200 MG tablet   Oral   Take 400 mg by mouth once.         Marland Kitchen lisinopril (PRINIVIL,ZESTRIL) 10 MG tablet   Oral   Take 10 mg by mouth daily.         . OxyCODONE (OXYCONTIN) 20 mg T12A 12 hr tablet   Oral   Take 20 mg by mouth every 4 (four) hours.          . pantoprazole (PROTONIX) 40 MG tablet   Oral   Take 1 tablet by mouth every morning.         . traMADol (ULTRAM) 50 MG tablet   Oral   Take 1 tablet by mouth 3 (three) times daily as needed.          BP 176/94  Pulse 65  Temp(Src) 97.6 F (36.4 C) (Oral)  Resp 18  Ht 5\' 4"  (1.626 m)  Wt 135 lb (61.236 kg)  BMI 23.16 kg/m2  SpO2 100%  LMP 08/30/2013 Physical Exam  Nursing note and vitals reviewed. Constitutional: She is oriented to person, place, and time. She appears well-developed and well-nourished. No distress.  HENT:  Head: Normocephalic and atraumatic.  Mouth/Throat: Oropharynx is clear and moist. No oropharyngeal exudate.  Eyes: Conjunctivae are normal.  Neck: Normal range of motion. Neck supple.  Full ROM with mild pain Nothing to palpation of the left paraspinal muscle, no midline tenderness  Cardiovascular: Normal rate, regular rhythm, normal heart sounds and intact distal pulses.   No murmur heard. Regular rate and rhythm, no murmur  Pulmonary/Chest: Effort normal and breath sounds normal. No respiratory distress. She has no wheezes.  Clear and equal breath sounds Pain to palpation of the anterior chest reproduces patient's symptoms  Abdominal: Soft. Bowel sounds are normal. She exhibits no distension. There is no tenderness.  No bruit   Musculoskeletal: Normal range of motion. She exhibits no edema and no tenderness.  Full range of motion of the T-spine and L-spine No tenderness to palpation of the spinous processes of the T-spine or L-spine Tenderness to palpation of the paraspinous muscles of the L-spine  Lymphadenopathy:    She has no cervical adenopathy.  Neurological: She is alert and oriented to person, place, and time. She has normal reflexes. She exhibits normal muscle tone. Coordination normal.  Speech is clear and goal oriented, follows commands Normal strength in upper and lower extremities bilaterally including dorsiflexion and plantar flexion, strong and equal grip strength Sensation normal to light and sharp touch Moves extremities without ataxia, coordination intact Normal gait Normal balance  Skin: Skin is warm and dry. No rash noted. She is not diaphoretic. No erythema.  Psychiatric: She has a normal mood and affect. Her behavior is normal.    ED Course  Procedures (including critical care time) Labs Review Labs Reviewed  URINALYSIS, ROUTINE W REFLEX MICROSCOPIC - Abnormal; Notable for the following:    APPearance CLOUDY (*)    All other components within normal limits  COMPREHENSIVE METABOLIC PANEL - Abnormal; Notable for the following:    Potassium 3.2 (*)    Total Bilirubin <0.2 (*)    All other components within normal limits  CBC - Abnormal; Notable for the following:    WBC 13.0 (*)    Hemoglobin 10.5 (*)    HCT 32.4 (*)    All other components within normal limits  POCT I-STAT TROPONIN I   Imaging Review Dg Chest 2 View  09/23/2013   CLINICAL DATA:  Chest pain and shortness of breath  EXAM: CHEST  2 VIEW  COMPARISON:  08/27/2012  FINDINGS: The heart size and mediastinal contours are within normal limits. Both lungs are clear. The visualized skeletal structures are unremarkable. Postsurgical changes in the right shoulder are noted.  IMPRESSION: No active cardiopulmonary disease.    Electronically Signed   By: Inez Catalina M.D.   On: 09/23/2013 21:31    EKG Interpretation    Date/Time:  Sunday September 23 2013 20:52:13 EST Ventricular Rate:  76 PR Interval:  158 QRS Duration: 79 QT Interval:  400 QTC Calculation: 450 R Axis:   38 Text Interpretation:  Sinus rhythm Low voltage, extremity leads Probable anteroseptal infarct, old compared to prior, no PVCs present. Confirmed by [facility]  MD, TREY ([id number]) on 09/23/2013 11:16:39 PM            MDM   1. Chest wall pain   2. Chronic back pain      Hartlyn Kollasch presents with chest pain and low back pain.  Chest pain is worsened with movement and reproducible with palpation.  .ACS bolus as. Patient's low-back pain is chronic in nature. No red flags or evidence of cauda equina. Patient ambulates without difficulty.  Chest pain is not likely of cardiac or pulmonary etiology d/t presentation, perc negative, VSS, no tracheal deviation, no JVD or new murmur, RRR, breath sounds equal bilaterally, EKG without acute abnormalities, negative troponin, and negative CXR. Patient with resolution of chest pain after narcotic administration. Also reports resolution of right arm paresthesia after narcotic administration. Pt has been advised to return to the ED if CP becomes exertional, associated with diaphoresis or nausea, radiates to left jaw/arm, worsens or becomes concerning in any way. Pt appears reliable for follow up and is agreeable to discharge.   Patient with back pain.  No neurological deficits and normal neuro exam.  Patient can walk but states is painful.  No loss of bowel or bladder control.  No concern for cauda equina.  No fever, night sweats, weight loss, h/o cancer, IVDU.  RICE protocol indicated and discussed with patient.   It has been determined that no acute conditions requiring further emergency intervention are present at this time. The patient/guardian have been advised of the diagnosis and plan. We have  discussed signs and symptoms that warrant return to the ED, such as changes or worsening in symptoms.   Vital signs are stable at discharge.   BP 176/94  Pulse 65  Temp(Src) 97.6 F (36.4 C) (Oral)  Resp 18  Ht 5\' 4"  (1.626 m)  Wt 135 lb (61.236 kg)  BMI 23.16 kg/m2  SpO2 100%  LMP 08/30/2013  Patient/guardian has voiced understanding and agreed to follow-up with the PCP or specialist.      Abigail Butts, PA-C 09/24/13 0010

## 2013-09-23 NOTE — ED Notes (Signed)
EKG given to EDP, Horton,MD. For review.

## 2013-09-23 NOTE — ED Notes (Signed)
Pt awaiting ride to come and pick her up.

## 2013-09-23 NOTE — ED Provider Notes (Signed)
Medical screening examination/treatment/procedure(s) were performed by non-physician practitioner and as supervising physician I was immediately available for consultation/collaboration.     Veryl Speak, MD 09/23/13 [phone]

## 2013-09-23 NOTE — Discharge Instructions (Signed)
1. Medications: usual home medications 2. Treatment: rest, drink plenty of fluids,  3. Follow Up: Please followup with your primary doctor for discussion of your diagnoses and further evaluation after today's visit; if you do not have a primary care doctor use the resource guide provided to find one;    Read instructions below for reasons to return to the Emergency Department. It is recommended that your follow up with your Primary Care Doctor in regards to today's visit. If you do not have a doctor, use the resource guide listed below to help you find one.   Chest Pain (Nonspecific)  HOME CARE INSTRUCTIONS  For the next few days, avoid physical activities that bring on chest pain. Continue physical activities as directed.  Do not smoke cigarettes or drink alcohol until your symptoms are gone.  Only take over-the-counter or prescription medicine for pain, discomfort, or fever as directed by your caregiver.  Follow your caregiver's suggestions for further testing if your chest pain does not go away.  Keep any follow-up appointments you made. If you do not go to an appointment, you could develop lasting (chronic) problems with pain. If there is any problem keeping an appointment, you must call to reschedule.  SEEK MEDICAL CARE IF:  You think you are having problems from the medicine you are taking. Read your medicine instructions carefully.  Your chest pain does not go away, even after treatment.  You develop a rash with blisters on your chest.  SEEK IMMEDIATE MEDICAL CARE IF:  You have increased chest pain or pain that spreads to your arm, neck, jaw, back, or belly (abdomen).  You develop shortness of breath, an increasing cough, or you are coughing up blood.  You have severe back or abdominal pain, feel sick to your stomach (nauseous) or throw up (vomit).  You develop severe weakness, fainting, or chills.  You have an oral temperature above 102 F (38.9 C), not controlled by medicine.    THIS IS AN EMERGENCY. Do not wait to see if the pain will go away. Get medical help at once. Call your local emergency services (911 in [place] Do not drive yourself to the hospital.   RESOURCE GUIDE  Dental Problems  Patients with Medicaid: [facility] [phone] [address] Phone:  [phone]                                                  Phone:  [phone]  If unable to pay or uninsured, contact:  [facility] or [facility]. to become qualified for the adult dental clinic.  Chronic Pain Problems Contact [facility]  [phone] Patients need to be referred by their primary care doctor.  Insufficient Money for Medicine Contact [employer]:  call "211" or [place] [phone].  No Primary Care Doctor Call [facility]  [phone] Other agencies that provide inexpensive medical care    [place]  [facility]  [place]  9016860440    [facility]  [phone]    [facility]  [place]  [place]  [phone] [place]   [phone] (emergency services [phone])  Substance Abuse Resources Alcohol and Drug Services  [place] [phone] [facility] [place] [phone] Residential & Outpatient Substance Abuse Program  [phone]  Abuse/Neglect [place] [phone] [place] [phone] (After Hours)  Emergency [place] [phone]  [place] [facility] the [place] [phone] [place] [phone]  MRSA Hotline #:   832-193-5362    [facility] Dept. [address]      [place] Phone:  [phone]                                   Phone:  [phone]                 Phone:  [place] Phone:  [place] [phone] (After Hours)

## 2013-09-23 NOTE — ED Notes (Signed)
Pt came to nurse first desk and stated that she did not want to wait any longer and was going to go to [facility] ED.

## 2013-09-23 NOTE — ED Notes (Signed)
Bed: WA23 Expected date:  Expected time:  Means of arrival:  Comments: 

## 2013-09-23 NOTE — ED Notes (Signed)
The pt has gone to [facility] ed

## 2013-09-23 NOTE — ED Notes (Signed)
Pt presents with c/o chest pain, shortness of breath, and low back pain. Pt says she started to have some chest pain and shortness of breath around 3 pm today. Pt went to [facility] to be evaluated and waited for a couple of hours but the wait was too long so she came to [place] for same. Pt has low back pain as well, hx of pinched nerves in her back. Pt is in no acute distress, talking in full sentences.

## 2013-09-23 NOTE — ED Notes (Signed)
Pt states she's had CP and SOB since this am. States she was here yesterday for back pain and discharged home and her back still hurts as well. A&Ox4, resp e/u

## 2013-09-24 NOTE — ED Notes (Signed)
Pt is aware that she was given narcotics earlier this evening and that she is not to be driving herself home. Pt is waiting on her son to come and pick her up in the lobby. Pt says she did not drive here and she understands that she is not to drive home.

## 2013-09-24 NOTE — ED Provider Notes (Signed)
Medical screening examination/treatment/procedure(s) were performed by non-physician practitioner and as supervising physician I was immediately available for consultation/collaboration.  EKG Interpretation    Date/Time:  Sunday September 23 2013 20:52:13 EST Ventricular Rate:  76 PR Interval:  158 QRS Duration: 79 QT Interval:  400 QTC Calculation: 450 R Axis:   38 Text Interpretation:  Sinus rhythm Low voltage, extremity leads Probable anteroseptal infarct, old compared to prior, no PVCs present. Confirmed by [facility]  MD, TREY [phone]) on 09/23/2013 11:16:39 PM              Houston Siren, MD 09/24/13 1327

## 2013-09-26 ENCOUNTER — Encounter: Payer: Self-pay | Admitting: Gastroenterology

## 2013-09-26 ENCOUNTER — Other Ambulatory Visit: Payer: Medicare Other | Admitting: Gastroenterology

## 2013-10-22 ENCOUNTER — Ambulatory Visit (AMBULATORY_SURGERY_CENTER): Payer: Self-pay | Admitting: *Deleted

## 2013-10-22 VITALS — Ht 64.0 in | Wt 141.2 lb

## 2013-10-22 DIAGNOSIS — Z1211 Encounter for screening for malignant neoplasm of colon: Secondary | ICD-10-CM

## 2013-10-22 MED ORDER — MOVIPREP 100 G PO SOLR: ORAL | Status: DC

## 2013-10-22 NOTE — Progress Notes (Signed)
No allergies to eggs or soy. No problems with anesthesia.  

## 2013-11-05 ENCOUNTER — Ambulatory Visit (AMBULATORY_SURGERY_CENTER): Payer: Medicare Other | Admitting: Gastroenterology

## 2013-11-05 ENCOUNTER — Encounter: Payer: Self-pay | Admitting: Gastroenterology

## 2013-11-05 VITALS — BP 166/90 | HR 69 | Temp 97.7°F | Resp 17 | Ht 64.0 in | Wt 141.0 lb

## 2013-11-05 DIAGNOSIS — Z1211 Encounter for screening for malignant neoplasm of colon: Secondary | ICD-10-CM

## 2013-11-05 DIAGNOSIS — D126 Benign neoplasm of colon, unspecified: Secondary | ICD-10-CM

## 2013-11-05 LAB — GLUCOSE, CAPILLARY
Glucose-Capillary: 98 mg/dL (ref 70–99)
Glucose-Capillary: 94 mg/dL (ref 70–99)

## 2013-11-05 MED ORDER — SODIUM CHLORIDE 0.9 % IV SOLN: 500.0000 mL | INTRAVENOUS | Status: DC

## 2013-11-05 NOTE — Progress Notes (Signed)
Called to room to assist during endoscopic procedure.  Patient ID and intended procedure confirmed with present staff. Received instructions for my participation in the procedure from the performing physician.  

## 2013-11-05 NOTE — Patient Instructions (Addendum)
One of your biggest health concerns is your smoking.  This increases your risk for most cancers and serious cardiovascular diseases such as strokes, heart attacks.  You should try your best to stop.  If you need assistance, please contact your PCP or Smoking Cessation Class at [facility] [phone]) or [place] (1-800-QUIT-NOW).   YOU HAD AN ENDOSCOPIC PROCEDURE TODAY AT [facility]: Refer to the procedure report that was given to you for any specific questions about what was found during the examination.  If the procedure report does not answer your questions, please call your gastroenterologist to clarify.  If you requested that your care partner not be given the details of your procedure findings, then the procedure report has been included in a sealed envelope for you to review at your convenience later.  YOU SHOULD EXPECT: Some feelings of bloating in the abdomen. Passage of more gas than usual.  Walking can help get rid of the air that was put into your GI tract during the procedure and reduce the bloating. If you had a lower endoscopy (such as a colonoscopy or flexible sigmoidoscopy) you [place] notice spotting of blood in your stool or on the toilet paper. If you underwent a bowel prep for your procedure, then you [place] not have a normal bowel movement for a few days.  DIET: Your first meal following the procedure should be a light meal and then it is ok to progress to your normal diet.  A half-sandwich or bowl of soup is an example of a good first meal.  Heavy or fried foods are harder to digest and [place] make you feel nauseous or bloated.  Likewise meals heavy in dairy and vegetables can cause extra gas to form and this can also increase the bloating.  Drink plenty of fluids but you should avoid alcoholic beverages for 24 hours.  ACTIVITY: Your care partner should take you home directly after the procedure.  You should plan to take it easy, moving slowly for the rest of  the day.  You can resume normal activity the day after the procedure however you should NOT DRIVE or use heavy machinery for 24 hours (because of the sedation medicines used during the test).    SYMPTOMS TO REPORT IMMEDIATELY: A gastroenterologist can be reached at any hour.  During normal business hours, 8:30 AM to 5:00 PM Monday through Friday, call [phone].  After hours and on weekends, please call the GI answering service at [phone] who will take a message and have the physician on call contact you.   Following lower endoscopy (colonoscopy or flexible sigmoidoscopy):  Excessive amounts of blood in the stool  Significant tenderness or worsening of abdominal pains  Swelling of the abdomen that is new, acute  Fever of 100F or higher  FOLLOW UP: If any biopsies were taken you will be contacted by phone or by letter within the next 1-3 weeks.  Call your gastroenterologist if you have not heard about the biopsies in 3 weeks.  Our staff will call the home number listed on your records the next business day following your procedure to check on you and address any questions or concerns that you [place] have at that time regarding the information given to you following your procedure. This is a courtesy call and so if there is no answer at the home number and we have not heard from you through the emergency physician on call, we will assume that you have returned  to your regular daily activities without incident.  SIGNATURES/CONFIDENTIALITY: You and/or your care partner have signed paperwork which will be entered into your electronic medical record.  These signatures attest to the fact that that the information above on your After Visit Summary has been reviewed and is understood.  Full responsibility of the confidentiality of this discharge information lies with you and/or your care-partner.  Please continue your normal medications  Please read over handout about polyps  Await  pathology

## 2013-11-05 NOTE — Progress Notes (Signed)
A/ox3 pleased with MAC, report to Kristin RN 

## 2013-11-05 NOTE — Op Note (Signed)
[place]  [address] [place], [id number]   COLONOSCOPY PROCEDURE REPORT  PATIENT: Kaylee Murphy, Kaylee Murphy  MR#: [id number] BIRTHDATE: Jun 25, 1961 , 53  yrs. old GENDER: Female ENDOSCOPIST: Milus Banister, MD REFERRED VP:XTGG Sheryle Hail, M.D. PROCEDURE DATE:  11/05/2013 PROCEDURE:   Colonoscopy with snare polypectomy First Screening Colonoscopy - Avg.  risk and is 50 yrs.  old or older Yes.  Prior Negative Screening - Now for repeat screening. N/A  History of Adenoma - Now for follow-up colonoscopy & has been > or = to 3 yrs.  N/A  Polyps Removed Today? Yes. ASA CLASS:   Class II INDICATIONS:average risk screening. MEDICATIONS: propofol (Diprivan) 200mg  IV and MAC sedation, administered by CRNA  DESCRIPTION OF PROCEDURE:   After the risks benefits and alternatives of the procedure were thoroughly explained, informed consent was obtained.  A digital rectal exam revealed no abnormalities of the rectum.   The LB [id number]  endoscope was introduced through the anus and advanced to the cecum, which was identified by both the appendix and ileocecal valve. No adverse events experienced.   The quality of the prep was adequate.  The instrument was then slowly withdrawn as the colon was fully examined.  COLON FINDINGS: Two polyps were found, removed and sent to pathology.  These were both sessile, located in descending and sigmoid segments, ranged in size from 3-46mm across, removed with cold snare, sent to pathology.  The examination was othewise normal.  Retroflexed views revealed no abnormalities. The time to cecum=6 minutes 57 seconds.  Withdrawal time=8 minutes 04 seconds. The scope was withdrawn and the procedure completed. COMPLICATIONS: There were no complications.  ENDOSCOPIC IMPRESSION: Two polyps were found, removed and sent to pathology. The examination was othewise normal.  RECOMMENDATIONS: If the polyp(s) removed today are proven to be  adenomatous (pre-cancerous) polyps, you will need a repeat colonoscopy in 5 years.  Otherwise you should continue to follow colorectal cancer screening guidelines for "routine risk" patients with colonoscopy in 10 years.  You will receive a letter within 1-2 weeks with the results of your biopsy as well as final recommendations.  Please call my office if you have not received a letter after 3 weeks.   eSigned:  Milus Banister, MD 11/05/2013 11:09 AM

## 2013-11-06 ENCOUNTER — Telehealth: Payer: Self-pay | Admitting: *Deleted

## 2013-11-06 NOTE — Telephone Encounter (Signed)
  Follow up Call-  Call back number 11/05/2013  Post procedure Call Back phone  # [phone]  Permission to leave phone message Yes     Patient questions:  Left message to call if necessary.

## 2013-11-09 ENCOUNTER — Encounter: Payer: Self-pay | Admitting: Gastroenterology

## 2013-11-12 ENCOUNTER — Encounter: Payer: Self-pay | Admitting: General Surgery

## 2013-12-11 ENCOUNTER — Encounter (HOSPITAL_COMMUNITY): Payer: Self-pay | Admitting: Emergency Medicine

## 2013-12-11 ENCOUNTER — Emergency Department (HOSPITAL_COMMUNITY)
Admission: EM | Admit: 2013-12-11 | Discharge: 2013-12-11 | Disposition: A | Discharge status: Home / Self Care | Payer: Medicare Other | Attending: Emergency Medicine | Admitting: Emergency Medicine

## 2013-12-11 DIAGNOSIS — M545 Low back pain, unspecified: Secondary | ICD-10-CM | POA: Insufficient documentation

## 2013-12-11 DIAGNOSIS — F329 Major depressive disorder, single episode, unspecified: Secondary | ICD-10-CM | POA: Insufficient documentation

## 2013-12-11 DIAGNOSIS — M549 Dorsalgia, unspecified: Secondary | ICD-10-CM

## 2013-12-11 DIAGNOSIS — I10 Essential (primary) hypertension: Secondary | ICD-10-CM | POA: Insufficient documentation

## 2013-12-11 DIAGNOSIS — G8929 Other chronic pain: Secondary | ICD-10-CM | POA: Insufficient documentation

## 2013-12-11 DIAGNOSIS — E119 Type 2 diabetes mellitus without complications: Secondary | ICD-10-CM | POA: Insufficient documentation

## 2013-12-11 DIAGNOSIS — F3289 Other specified depressive episodes: Secondary | ICD-10-CM | POA: Insufficient documentation

## 2013-12-11 DIAGNOSIS — Z79899 Other long term (current) drug therapy: Secondary | ICD-10-CM | POA: Insufficient documentation

## 2013-12-11 DIAGNOSIS — F17200 Nicotine dependence, unspecified, uncomplicated: Secondary | ICD-10-CM | POA: Insufficient documentation

## 2013-12-11 NOTE — ED Provider Notes (Signed)
CSN: [id number]     Arrival date & time 12/11/13  1410 History  This chart was scribed for Glendell Docker, NP working with Tanna Furry, MD by Roxan Diesel, ED Scribe. This patient was seen in room TR09C/TR09C and the patient's care was started at 2:51 PM.   Chief Complaint  Patient presents with  . Back Pain    The history is provided by the patient. No language interpreter was used.    HPI Comments: Kaylee Murphy is a 53 y.o. female who presents to the Emergency Department complaining of several days exacerbation of her chronic back pain.  Pt states she has been moving and doing heavy lifting and her pain has been worsening.  Currently she complains of severe constant pain in her lower back that she describes as definitely the same as her chronic pain.  She medicates regularly with ibuprofen and 20mg  oxycodone but these medications have not been providing relief.      Past Medical History  Diagnosis Date  . Diabetes mellitus   . Hypertension   . Hyperlipidemia   . Chronic shoulder pain 10 years    s/p multiple b/l rotator cuff repair sx - per MRI 04/2010 -  Delamination of the infraspinatus with mild fatty atrophysuggest distal tendon tear with medial propagation. Depending onthe hardware, CT arthrography may add information for evaluation othe rotator cuff.f   . Depression   . Chronic back pain     Past Surgical History  Procedure Laterality Date  . Rotator cuff repair Right 2005  . Rotator cuff repair Right 2004  . Rotator cuff repair Left 2007  . Breast reduction surgery  1996  . Tubal ligation  1991    Family History  Problem Relation Age of Onset  . Cancer Mother   . Stroke Father   . Colon cancer Neg Hx     History  Substance Use Topics  . Smoking status: Current Every Day Smoker -- 0.25 packs/day    Types: Cigarettes  . Smokeless tobacco: Never Used     Comment: 5-6 cigs/day  . Alcohol Use: No    OB History   Grav Para Term Preterm Abortions TAB SAB  Ect Mult Living                   Review of Systems  Musculoskeletal: Positive for back pain.  All other systems reviewed and are negative.      Allergies  Cephalexin; Aspirin; Codeine; Ketorolac tromethamine; and Morphine and related  Home Medications   Current Outpatient Rx  Name  Route  Sig  Dispense  Refill  . diazepam (VALIUM) 5 MG tablet   Oral   Take 5 mg by mouth every 6 (six) hours as needed for muscle spasms.         Marland Kitchen ibuprofen (ADVIL,MOTRIN) 200 MG tablet   Oral   Take 400 mg by mouth once.         Marland Kitchen lisinopril (PRINIVIL,ZESTRIL) 10 MG tablet   Oral   Take 10 mg by mouth daily.         . metFORMIN (GLUCOPHAGE) 500 MG tablet   Oral   Take by mouth daily with breakfast.         . OxyCODONE (OXYCONTIN) 20 mg T12A 12 hr tablet   Oral   Take 20 mg by mouth every 4 (four) hours.          . pantoprazole (PROTONIX) 40 MG tablet   Oral   Take  1 tablet by mouth every morning.         . traMADol (ULTRAM) 50 MG tablet   Oral   Take 1 tablet by mouth 3 (three) times daily as needed.          BP 136/67  Pulse 70  Temp(Src) 97.7 F (36.5 C) (Oral)  Resp 18  Ht 5\' 4"  (1.626 m)  Wt 140 lb (63.504 kg)  BMI 24.02 kg/m2  SpO2 100%  Physical Exam  Nursing note and vitals reviewed. Constitutional: She is oriented to person, place, and time. She appears well-developed and well-nourished. No distress.  HENT:  Head: Normocephalic and atraumatic.  Eyes: EOM are normal.  Neck: Neck supple. No tracheal deviation present.  Cardiovascular: Normal rate.   Pulmonary/Chest: Effort normal. No respiratory distress.  Musculoskeletal: Normal range of motion.  Moving all extremities Nontender to palpation  Neurological: She is alert and oriented to person, place, and time.  Skin: Skin is warm and dry.  Psychiatric: She has a normal mood and affect. Her behavior is normal.    ED Course  Procedures (including critical care time)  DIAGNOSTIC  STUDIES: Oxygen Saturation is 100% on room air, normal by my interpretation.    COORDINATION OF CARE: 2:53 PM-Informed pt that she will not relieve narcotic pain medications in the ED for her chronic pain.  Pt decline muscle relaxants because "they don't work."  Discussed treatment plan which includes f/u with pain specialist with pt at bedside and pt agreed to plan.     Labs Review Labs Reviewed - No data to display  Imaging Review No results found.   EKG Interpretation None      MDM   Final diagnoses:  Chronic back pain    Pt wants narcotic shots    I personally performed the services described in this documentation, which was scribed in my presence. The recorded information has been reviewed and is accurate.     Glendell Docker, NP 12/24/13 1005

## 2013-12-11 NOTE — ED Notes (Signed)
Pt reports hx of back problems, has been moving and heavy lifting and now having increase in lower back pain. Ambulatory at triage.

## 2013-12-11 NOTE — Discharge Instructions (Signed)
Back Pain, Adult Low back pain is very common. About 1 in 5 people have back pain.The cause of low back pain is rarely dangerous. The pain often gets better over time.About half of people with a sudden onset of back pain feel better in just 2 weeks. About 8 in 10 people feel better by 6 weeks.  CAUSES Some common causes of back pain include:  Strain of the muscles or ligaments supporting the spine.  Wear and tear (degeneration) of the spinal discs.  Arthritis.  Direct injury to the back. DIAGNOSIS Most of the time, the direct cause of low back pain is not known.However, back pain can be treated effectively even when the exact cause of the pain is unknown.Answering your caregiver's questions about your overall health and symptoms is one of the most accurate ways to make sure the cause of your pain is not dangerous. If your caregiver needs more information, he or she may order lab work or imaging tests (X-rays or MRIs).However, even if imaging tests show changes in your back, this usually does not require surgery. HOME CARE INSTRUCTIONS For many people, back pain returns.Since low back pain is rarely dangerous, it is often a condition that people can learn to manageon their own.   Remain active. It is stressful on the back to sit or stand in one place. Do not sit, drive, or stand in one place for more than 30 minutes at a time. Take short walks on level surfaces as soon as pain allows.Try to increase the length of time you walk each day.  Do not stay in bed.Resting more than 1 or 2 days can delay your recovery.  Do not avoid exercise or work.Your body is made to move.It is not dangerous to be active, even though your back may hurt.Your back will likely heal faster if you return to being active before your pain is gone.  Pay attention to your body when you bend and lift. Many people have less discomfortwhen lifting if they bend their knees, keep the load close to their bodies,and  avoid twisting. Often, the most comfortable positions are those that put less stress on your recovering back.  Find a comfortable position to sleep. Use a firm mattress and lie on your side with your knees slightly bent. If you lie on your back, put a pillow under your knees.  Only take over-the-counter or prescription medicines as directed by your caregiver. Over-the-counter medicines to reduce pain and inflammation are often the most helpful.Your caregiver may prescribe muscle relaxant drugs.These medicines help dull your pain so you can more quickly return to your normal activities and healthy exercise.  Put ice on the injured area.  Put ice in a plastic bag.  Place a towel between your skin and the bag.  Leave the ice on for 15-20 minutes, 03-04 times a day for the first 2 to 3 days. After that, ice and heat may be alternated to reduce pain and spasms.  Ask your caregiver about trying back exercises and gentle massage. This may be of some benefit.  Avoid feeling anxious or stressed.Stress increases muscle tension and can worsen back pain.It is important to recognize when you are anxious or stressed and learn ways to manage it.Exercise is a great option. SEEK MEDICAL CARE IF:  You have pain that is not relieved with rest or medicine.  You have pain that does not improve in 1 week.  You have new symptoms.  You are generally not feeling well. SEEK   IMMEDIATE MEDICAL CARE IF:   You have pain that radiates from your back into your legs.  You develop new bowel or bladder control problems.  You have unusual weakness or numbness in your arms or legs.  You develop nausea or vomiting.  You develop abdominal pain.  You feel faint. Document Released: 08/30/2005 Document Revised: 02/29/2012 Document Reviewed: 01/18/2011 [employer] Patient Information 2014 [employer].  

## 2013-12-11 NOTE — ED Notes (Signed)
MD Jeneen Rinks to bedside.

## 2013-12-11 NOTE — ED Notes (Signed)
Pt left without waiting for discharge paperwork or discharge instructions, was told she was being discharged by Alvino Chapel NP and Dr. Jeneen Rinks, pt left after speaking with MD.

## 2013-12-11 NOTE — ED Provider Notes (Signed)
Patient seen examined. History reviewed. Her exam is reassuring. She has no historical symptoms, or physical exam findings that would suggest an acute condition. I feel she's had an appropriate and adequate medical screening exam without further studies. I agree with Kaylee Murphy PAs asessment.  I had a discussion with the patient is missing a medical screening exam. Do not feel there is any indication for narcotics at this time. Feel this would be simply treating an acute exacerbation of chronic pain. Referred back to her primary care physician if this is adequate for her ongoing pain needs and I have recommended that she see a pain management physician.  I also recommended for today and tomorrow she rest. Use a back safe position. Continue her chronic pain meds. Reduce activity such as lifting and carrying furniture, that would exacerbate her symptoms.  Tanna Furry, MD 12/11/13 [phone]

## 2013-12-24 NOTE — ED Provider Notes (Signed)
Medical screening examination/treatment/procedure(s) were performed by non-physician practitioner and as supervising physician I was immediately available for consultation/collaboration.   EKG Interpretation None        Tanna Furry, MD 12/24/13 2356

## 2014-02-17 ENCOUNTER — Encounter (HOSPITAL_COMMUNITY): Payer: Self-pay | Admitting: Emergency Medicine

## 2014-02-17 ENCOUNTER — Emergency Department (HOSPITAL_COMMUNITY): Payer: Medicare Other

## 2014-02-17 ENCOUNTER — Emergency Department (HOSPITAL_COMMUNITY)
Admission: EM | Admit: 2014-02-17 | Discharge: 2014-02-17 | Disposition: A | Discharge status: Home / Self Care | Payer: Medicare Other | Attending: Emergency Medicine | Admitting: Emergency Medicine

## 2014-02-17 DIAGNOSIS — I10 Essential (primary) hypertension: Secondary | ICD-10-CM | POA: Insufficient documentation

## 2014-02-17 DIAGNOSIS — F3289 Other specified depressive episodes: Secondary | ICD-10-CM | POA: Insufficient documentation

## 2014-02-17 DIAGNOSIS — R109 Unspecified abdominal pain: Secondary | ICD-10-CM

## 2014-02-17 DIAGNOSIS — E119 Type 2 diabetes mellitus without complications: Secondary | ICD-10-CM | POA: Insufficient documentation

## 2014-02-17 DIAGNOSIS — G8929 Other chronic pain: Secondary | ICD-10-CM | POA: Insufficient documentation

## 2014-02-17 DIAGNOSIS — R110 Nausea: Secondary | ICD-10-CM | POA: Insufficient documentation

## 2014-02-17 DIAGNOSIS — M549 Dorsalgia, unspecified: Secondary | ICD-10-CM | POA: Insufficient documentation

## 2014-02-17 DIAGNOSIS — F329 Major depressive disorder, single episode, unspecified: Secondary | ICD-10-CM | POA: Insufficient documentation

## 2014-02-17 DIAGNOSIS — R197 Diarrhea, unspecified: Secondary | ICD-10-CM | POA: Insufficient documentation

## 2014-02-17 DIAGNOSIS — R1013 Epigastric pain: Secondary | ICD-10-CM | POA: Insufficient documentation

## 2014-02-17 DIAGNOSIS — M2550 Pain in unspecified joint: Secondary | ICD-10-CM | POA: Insufficient documentation

## 2014-02-17 DIAGNOSIS — Z79899 Other long term (current) drug therapy: Secondary | ICD-10-CM | POA: Insufficient documentation

## 2014-02-17 DIAGNOSIS — F17200 Nicotine dependence, unspecified, uncomplicated: Secondary | ICD-10-CM | POA: Insufficient documentation

## 2014-02-17 LAB — CBC WITH DIFFERENTIAL/PLATELET
Basophils Absolute: 0.1 10*3/uL (ref 0.0–0.1)
Basophils Relative: 1 % (ref 0–1)
EOS PCT: 2 % (ref 0–5)
Eosinophils Absolute: 0.3 10*3/uL (ref 0.0–0.7)
HCT: 33.3 % — ABNORMAL LOW (ref 36.0–46.0)
HEMOGLOBIN: 10.7 g/dL — AB (ref 12.0–15.0)
LYMPHS ABS: 4.7 10*3/uL — AB (ref 0.7–4.0)
LYMPHS PCT: 35 % (ref 12–46)
MCH: 26.2 pg (ref 26.0–34.0)
MCHC: 32.1 g/dL (ref 30.0–36.0)
MCV: 81.4 fL (ref 78.0–100.0)
MONOS PCT: 7 % (ref 3–12)
Monocytes Absolute: 0.9 10*3/uL (ref 0.1–1.0)
Neutro Abs: 7.3 10*3/uL (ref 1.7–7.7)
Neutrophils Relative %: 55 % (ref 43–77)
Platelets: 335 10*3/uL (ref 150–400)
RBC: 4.09 MIL/uL (ref 3.87–5.11)
RDW: 15.8 % — ABNORMAL HIGH (ref 11.5–15.5)
WBC: 13.2 10*3/uL — AB (ref 4.0–10.5)

## 2014-02-17 LAB — URINALYSIS, ROUTINE W REFLEX MICROSCOPIC
BILIRUBIN URINE: NEGATIVE
Glucose, UA: NEGATIVE mg/dL
Hgb urine dipstick: NEGATIVE
Ketones, ur: NEGATIVE mg/dL
Leukocytes, UA: NEGATIVE
Nitrite: NEGATIVE
PROTEIN: NEGATIVE mg/dL
Specific Gravity, Urine: 1.012 (ref 1.005–1.030)
UROBILINOGEN UA: 0.2 mg/dL (ref 0.0–1.0)
pH: 7 (ref 5.0–8.0)

## 2014-02-17 LAB — COMPREHENSIVE METABOLIC PANEL
ALT: 8 U/L (ref 0–35)
AST: 14 U/L (ref 0–37)
Albumin: 3.6 g/dL (ref 3.5–5.2)
Alkaline Phosphatase: 72 U/L (ref 39–117)
BUN: 9 mg/dL (ref 6–23)
CALCIUM: 9 mg/dL (ref 8.4–10.5)
CO2: 25 mEq/L (ref 19–32)
Chloride: 108 mEq/L (ref 96–112)
Creatinine, Ser: 0.76 mg/dL (ref 0.50–1.10)
GLUCOSE: 100 mg/dL — AB (ref 70–99)
Potassium: 3.7 mEq/L (ref 3.7–5.3)
SODIUM: 145 meq/L (ref 137–147)
Total Bilirubin: <0.2 mg/dL — ABNORMAL LOW (ref 0.3–1.2)
Total Protein: 7.3 g/dL (ref 6.0–8.3)

## 2014-02-17 LAB — LIPASE, BLOOD: Lipase: 33 U/L (ref 11–59)

## 2014-02-17 MED ORDER — HYDROMORPHONE HCL PF 1 MG/ML IJ SOLN
0.5000 mg | Freq: Once | INTRAMUSCULAR | Status: AC
  Administered 2014-02-17: 0.5 mg via INTRAVENOUS
  Filled 2014-02-17: qty 1

## 2014-02-17 MED ORDER — ONDANSETRON HCL 4 MG/2ML IJ SOLN
4.0000 mg | Freq: Once | INTRAMUSCULAR | Status: AC
  Administered 2014-02-17: 4 mg via INTRAVENOUS
  Filled 2014-02-17: qty 2

## 2014-02-17 MED ORDER — DICYCLOMINE HCL 20 MG PO TABS: 20.0000 mg | ORAL_TABLET | Freq: Two times a day (BID) | ORAL | Status: DC

## 2014-02-17 MED ORDER — HYOSCYAMINE SULFATE 0.5 MG/ML IJ SOLN
0.2500 mg | Freq: Once | INTRAMUSCULAR | Status: AC
  Administered 2014-02-17: 0.25 mg via INTRAVENOUS
  Filled 2014-02-17 (×2): qty 0.5

## 2014-02-17 MED ORDER — ONDANSETRON HCL 4 MG PO TABS: 4.0000 mg | ORAL_TABLET | Freq: Four times a day (QID) | ORAL | Status: DC

## 2014-02-17 NOTE — Discharge Instructions (Signed)
Abdominal Pain, Women °Abdominal (stomach, pelvic, or belly) pain can be caused by many things. It is important to tell your doctor: °· The location of the pain. °· Does it come and go or is it present all the time? °· Are there things that start the pain (eating certain foods, exercise)? °· Are there other symptoms associated with the pain (fever, nausea, vomiting, diarrhea)? °All of this is helpful to know when trying to find the cause of the pain. °CAUSES  °· Stomach: virus or bacteria infection, or ulcer. °· Intestine: appendicitis (inflamed appendix), regional ileitis (Crohn's disease), ulcerative colitis (inflamed colon), irritable bowel syndrome, diverticulitis (inflamed diverticulum of the colon), or cancer of the stomach or intestine. °· Gallbladder disease or stones in the gallbladder. °· Kidney disease, kidney stones, or infection. °· Pancreas infection or cancer. °· Fibromyalgia (pain disorder). °· Diseases of the female organs: °· Uterus: fibroid (non-cancerous) tumors or infection. °· Fallopian tubes: infection or tubal pregnancy. °· Ovary: cysts or tumors. °· Pelvic adhesions (scar tissue). °· Endometriosis (uterus lining tissue growing in the pelvis and on the pelvic organs). °· Pelvic congestion syndrome (female organs filling up with blood just before the menstrual period). °· Pain with the menstrual period. °· Pain with ovulation (producing an egg). °· Pain with an IUD (intrauterine device, birth control) in the uterus. °· Cancer of the female organs. °· Functional pain (pain not caused by a disease, may improve without treatment). °· Psychological pain. °· Depression. °DIAGNOSIS  °Your doctor will decide the seriousness of your pain by doing an examination. °· Blood tests. °· X-rays. °· Ultrasound. °· CT scan (computed tomography, special type of X-ray). °· MRI (magnetic resonance imaging). °· Cultures, for infection. °· Barium enema (dye inserted in the large intestine, to better view it with  X-rays). °· Colonoscopy (looking in intestine with a lighted tube). °· Laparoscopy (minor surgery, looking in abdomen with a lighted tube). °· Major abdominal exploratory surgery (looking in abdomen with a large incision). °TREATMENT  °The treatment will depend on the cause of the pain.  °· Many cases can be observed and treated at home. °· Over-the-counter medicines recommended by your caregiver. °· Prescription medicine. °· Antibiotics, for infection. °· Birth control pills, for painful periods or for ovulation pain. °· Hormone treatment, for endometriosis. °· Nerve blocking injections. °· Physical therapy. °· Antidepressants. °· Counseling with a psychologist or psychiatrist. °· Minor or major surgery. °HOME CARE INSTRUCTIONS  °· Do not take laxatives, unless directed by your caregiver. °· Take over-the-counter pain medicine only if ordered by your caregiver. Do not take aspirin because it can cause an upset stomach or bleeding. °· Try a clear liquid diet (broth or water) as ordered by your caregiver. Slowly move to a bland diet, as tolerated, if the pain is related to the stomach or intestine. °· Have a thermometer and take your temperature several times a day, and record it. °· Bed rest and sleep, if it helps the pain. °· Avoid sexual intercourse, if it causes pain. °· Avoid stressful situations. °· Keep your follow-up appointments and tests, as your caregiver orders. °· If the pain does not go away with medicine or surgery, you may try: °· Acupuncture. °· Relaxation exercises (yoga, meditation). °· Group therapy. °· Counseling. °SEEK MEDICAL CARE IF:  °· You notice certain foods cause stomach pain. °· Your home care treatment is not helping your pain. °· You need stronger pain medicine. °· You want your IUD removed. °· You feel faint or   lightheaded. °· You develop nausea and vomiting. °· You develop a rash. °· You are having side effects or an allergy to your medicine. °SEEK IMMEDIATE MEDICAL CARE IF:  °· Your  pain does not go away or gets worse. °· You have a fever. °· Your pain is felt only in portions of the abdomen. The right side could possibly be appendicitis. The left lower portion of the abdomen could be colitis or diverticulitis. °· You are passing blood in your stools (bright red or black tarry stools, with or without vomiting). °· You have blood in your urine. °· You develop chills, with or without a fever. °· You pass out. °MAKE SURE YOU:  °· Understand these instructions. °· Will watch your condition. °· Will get help right away if you are not doing well or get worse. °Document Released: 06/27/2007 Document Revised: 11/22/2011 Document Reviewed: 07/17/2009 °[employer]® Patient Information ©2014 [employer]. ° °

## 2014-02-17 NOTE — ED Provider Notes (Signed)
CSN: [id number]     Arrival date & time 02/17/14  1716 History   First MD Initiated Contact with Patient 02/17/14 1830     Chief Complaint  Patient presents with  . Abdominal Pain  . Nausea  . Diarrhea     (Consider location/radiation/quality/duration/timing/severity/associated sxs/prior Treatment) Patient is a 53 y.o. female presenting with abdominal pain and diarrhea. The history is provided by the patient. No language interpreter was used.  Abdominal Pain Pain location:  Epigastric Pain quality: cramping and sharp   Pain radiates to:  Does not radiate Pain severity:  Moderate Timing:  Constant Associated symptoms: diarrhea   Associated symptoms: no chest pain, no chills, no dysuria, no fever, no nausea, no shortness of breath, no vaginal bleeding, no vaginal discharge and no vomiting   Associated symptoms comment:  Pain upper abdomen that started yesterday associated with multiple episodes of non-bloody diarrhea. She took Immodium today which alleviated the diarrhea but the pain continues with increased severity today. Nausea with vomiting. No fever. She complains of lower back pain as well. No dysuria, vaginal discharge or bleeding. She also has exacerbation of recurrent/chronic shoulder pain.  Diarrhea Associated symptoms: abdominal pain and arthralgias   Associated symptoms: no chills, no fever and no vomiting     Past Medical History  Diagnosis Date  . Diabetes mellitus   . Hypertension   . Hyperlipidemia   . Chronic shoulder pain 10 years    s/p multiple b/l rotator cuff repair sx - per MRI 04/2010 -  Delamination of the infraspinatus with mild fatty atrophysuggest distal tendon tear with medial propagation. Depending onthe hardware, CT arthrography may add information for evaluation othe rotator cuff.f   . Depression   . Chronic back pain    Past Surgical History  Procedure Laterality Date  . Rotator cuff repair Right 2005  . Rotator cuff repair Right 2004  . Rotator  cuff repair Left 2007  . Breast reduction surgery  1996  . Tubal ligation  1991   Family History  Problem Relation Age of Onset  . Cancer Mother   . Stroke Father   . Colon cancer Neg Hx    History  Substance Use Topics  . Smoking status: Current Every Day Smoker -- 0.25 packs/day    Types: Cigarettes  . Smokeless tobacco: Never Used     Comment: 5-6 cigs/day  . Alcohol Use: No   OB History   Grav Para Term Preterm Abortions TAB SAB Ect Mult Living                 Review of Systems  Constitutional: Negative for fever and chills.  Respiratory: Negative.  Negative for shortness of breath.   Cardiovascular: Negative.  Negative for chest pain.  Gastrointestinal: Positive for abdominal pain and diarrhea. Negative for nausea, vomiting and blood in stool.  Genitourinary: Negative.  Negative for dysuria, vaginal bleeding and vaginal discharge.  Musculoskeletal: Positive for arthralgias and back pain.  Skin: Negative.  Negative for rash.  Neurological: Negative.       Allergies  Cephalexin; Aspirin; Codeine; Ketorolac tromethamine; and Morphine and related  Home Medications   Prior to Admission medications   Medication Sig Start Date End Date Taking? Authorizing Provider  diazepam (VALIUM) 5 MG tablet Take 5 mg by mouth every 6 (six) hours as needed for muscle spasms. 08/28/13  Yes Tatyana A Kirichenko, PA-C  lisinopril (PRINIVIL,ZESTRIL) 10 MG tablet Take 10 mg by mouth daily.   Yes Historical Provider, MD  OxyCODONE (OXYCONTIN) 20 mg T12A 12 hr tablet Take 20 mg by mouth every 4 (four) hours.    Yes Historical Provider, MD  pantoprazole (PROTONIX) 40 MG tablet Take 1 tablet by mouth every morning. 08/30/13  Yes Historical Provider, MD   BP 159/79  Pulse 60  Temp(Src) 98.2 F (36.8 C) (Oral)  Resp 20  SpO2 98%  LMP 02/11/2014 Physical Exam  Constitutional: She is oriented to person, place, and time. She appears well-developed and well-nourished.  HENT:  Head:  Normocephalic.  Neck: Normal range of motion. Neck supple.  Cardiovascular: Normal rate and regular rhythm.   Pulmonary/Chest: Effort normal and breath sounds normal.  Abdominal: Soft. Bowel sounds are normal. There is tenderness. There is no rebound and no guarding.  Epigastric tenderness without guarding or rebound. Soft abdomen. No peritoneal signs.   Musculoskeletal: Normal range of motion.  Neurological: She is alert and oriented to person, place, and time.  Skin: Skin is warm and dry. No rash noted.  Psychiatric: She has a normal mood and affect.    ED Course  Procedures (including critical care time) Labs Review Labs Reviewed  CBC WITH DIFFERENTIAL  COMPREHENSIVE METABOLIC PANEL  LIPASE, BLOOD  URINALYSIS, ROUTINE W REFLEX MICROSCOPIC   Results for orders placed during the hospital encounter of 02/17/14  CBC WITH DIFFERENTIAL      Result Value Ref Range   WBC 13.2 (*) 4.0 - 10.5 K/uL   RBC 4.09  3.87 - 5.11 MIL/uL   Hemoglobin 10.7 (*) 12.0 - 15.0 g/dL   HCT 33.3 (*) 36.0 - 46.0 %   MCV 81.4  78.0 - 100.0 fL   MCH 26.2  26.0 - 34.0 pg   MCHC 32.1  30.0 - 36.0 g/dL   RDW 15.8 (*) 11.5 - 15.5 %   Platelets 335  150 - 400 K/uL   Neutrophils Relative % 55  43 - 77 %   Neutro Abs 7.3  1.7 - 7.7 K/uL   Lymphocytes Relative 35  12 - 46 %   Lymphs Abs 4.7 (*) 0.7 - 4.0 K/uL   Monocytes Relative 7  3 - 12 %   Monocytes Absolute 0.9  0.1 - 1.0 K/uL   Eosinophils Relative 2  0 - 5 %   Eosinophils Absolute 0.3  0.0 - 0.7 K/uL   Basophils Relative 1  0 - 1 %   Basophils Absolute 0.1  0.0 - 0.1 K/uL  COMPREHENSIVE METABOLIC PANEL      Result Value Ref Range   Sodium 145  137 - 147 mEq/L   Potassium 3.7  3.7 - 5.3 mEq/L   Chloride 108  96 - 112 mEq/L   CO2 25  19 - 32 mEq/L   Glucose, Bld 100 (*) 70 - 99 mg/dL   BUN 9  6 - 23 mg/dL   Creatinine, Ser 0.76  0.50 - 1.10 mg/dL   Calcium 9.0  8.4 - 10.5 mg/dL   Total Protein 7.3  6.0 - 8.3 g/dL   Albumin 3.6  3.5 - 5.2 g/dL    AST 14  0 - 37 U/L   ALT 8  0 - 35 U/L   Alkaline Phosphatase 72  39 - 117 U/L   Total Bilirubin <0.2 (*) 0.3 - 1.2 mg/dL   GFR calc non Af Amer >90  >90 mL/min   GFR calc Af Amer >90  >90 mL/min  LIPASE, BLOOD      Result Value Ref Range   Lipase  33  11 - 59 U/L  URINALYSIS, ROUTINE W REFLEX MICROSCOPIC      Result Value Ref Range   Color, Urine YELLOW  YELLOW   APPearance CLEAR  CLEAR   Specific Gravity, Urine 1.012  1.005 - 1.030   pH 7.0  5.0 - 8.0   Glucose, UA NEGATIVE  NEGATIVE mg/dL   Hgb urine dipstick NEGATIVE  NEGATIVE   Bilirubin Urine NEGATIVE  NEGATIVE   Ketones, ur NEGATIVE  NEGATIVE mg/dL   Protein, ur NEGATIVE  NEGATIVE mg/dL   Urobilinogen, UA 0.2  0.0 - 1.0 mg/dL   Nitrite NEGATIVE  NEGATIVE   Leukocytes, UA NEGATIVE  NEGATIVE   Dg Abd Acute W/chest  02/17/2014   CLINICAL DATA:  Mid-abdominal pain with nausea.  EXAM: ACUTE ABDOMEN SERIES (ABDOMEN 2 VIEW & CHEST 1 VIEW)  COMPARISON:  09/23/2013 chest.  FINDINGS: There is no evidence of dilated bowel loops or free intraperitoneal air. No radiopaque calculi or other significant radiographic abnormality is seen. Heart size and mediastinal contours are within normal limits. Both lungs are clear. Stable appearance from priors.  IMPRESSION: Negative abdominal radiographs.  No acute cardiopulmonary disease.   Electronically Signed   By: Rolla Flatten M.D.   On: 02/17/2014 20:24   Imaging Review No results found.   EKG Interpretation None      MDM   Final diagnoses:  None    1. Abdominal pain  Pain is resolved with medications in ED and she is feeling much better. Labs and imaging reassuring. Will discharge home on Bentyl and Zofran. Encourage PCP follow up.    Dewaine Oats, PA-C 02/17/14 2137

## 2014-02-17 NOTE — ED Provider Notes (Signed)
Medical screening examination/treatment/procedure(s) were performed by non-physician practitioner and as supervising physician I was immediately available for consultation/collaboration.  6151  Pt feeling much better after her treatment.  No abdominal ttp.  Soft, non distended.   At this time there does not appear to be any evidence of an acute emergency medical condition and the patient appears stable for discharge with appropriate outpatient follow up.   Dorie Rank, MD 02/17/14 2128

## 2014-02-17 NOTE — ED Notes (Signed)
Pt states she started having abdominal pain, nausea, diarrhea and back pain starting yesterday.  Pt states she felt normal prior to yesterday.  Pt states no fever.  Pt also says rt arm is hurting constantly.  Pt has hx of rotator cuff surgery and normally has pain but cannot get it under control.

## 2014-02-17 NOTE — ED Notes (Signed)
Pt just went to restroom an urinated and was unaware she needed to give a sample. Pt states she will let us know next time before she goes.

## 2014-07-09 ENCOUNTER — Other Ambulatory Visit (HOSPITAL_COMMUNITY): Payer: Self-pay | Admitting: Internal Medicine

## 2014-07-09 DIAGNOSIS — Z1231 Encounter for screening mammogram for malignant neoplasm of breast: Secondary | ICD-10-CM

## 2014-08-05 ENCOUNTER — Ambulatory Visit (HOSPITAL_COMMUNITY): Payer: Medicare Other | Attending: Internal Medicine

## 2016-04-02 ENCOUNTER — Encounter (HOSPITAL_COMMUNITY): Payer: Self-pay | Admitting: Emergency Medicine

## 2016-04-02 DIAGNOSIS — E119 Type 2 diabetes mellitus without complications: Secondary | ICD-10-CM | POA: Insufficient documentation

## 2016-04-02 DIAGNOSIS — R9431 Abnormal electrocardiogram [ECG] [EKG]: Secondary | ICD-10-CM | POA: Insufficient documentation

## 2016-04-02 DIAGNOSIS — R079 Chest pain, unspecified: Secondary | ICD-10-CM | POA: Diagnosis present

## 2016-04-02 DIAGNOSIS — I10 Essential (primary) hypertension: Secondary | ICD-10-CM | POA: Insufficient documentation

## 2016-04-02 DIAGNOSIS — F17210 Nicotine dependence, cigarettes, uncomplicated: Secondary | ICD-10-CM | POA: Diagnosis not present

## 2016-04-02 DIAGNOSIS — Z5321 Procedure and treatment not carried out due to patient leaving prior to being seen by health care provider: Secondary | ICD-10-CM | POA: Insufficient documentation

## 2016-04-02 NOTE — ED Notes (Signed)
Pt. reports intermittent central chest tightness with mild SOB and diaphoresis onset 2 days ago , denies nausea or emesis .

## 2016-04-03 ENCOUNTER — Emergency Department (HOSPITAL_COMMUNITY): Payer: Medicare Other

## 2016-04-03 ENCOUNTER — Ambulatory Visit (HOSPITAL_COMMUNITY): Admission: RE | Admit: 2016-04-03 | Payer: Medicare Other | Source: Ambulatory Visit

## 2016-04-03 ENCOUNTER — Emergency Department (HOSPITAL_COMMUNITY)
Admission: EM | Admit: 2016-04-03 | Discharge: 2016-04-03 | Disposition: A | Discharge status: Home / Self Care | Payer: Medicare Other | Attending: Emergency Medicine | Admitting: Emergency Medicine

## 2016-04-03 DIAGNOSIS — R9431 Abnormal electrocardiogram [ECG] [EKG]: Secondary | ICD-10-CM | POA: Diagnosis not present

## 2016-04-03 LAB — I-STAT TROPONIN, ED: TROPONIN I, POC: 0 ng/mL (ref 0.00–0.08)

## 2016-04-03 LAB — CBC
HCT: 29.6 % — ABNORMAL LOW (ref 36.0–46.0)
HEMOGLOBIN: 9.4 g/dL — AB (ref 12.0–15.0)
MCH: 25.9 pg — ABNORMAL LOW (ref 26.0–34.0)
MCHC: 31.8 g/dL (ref 30.0–36.0)
MCV: 81.5 fL (ref 78.0–100.0)
PLATELETS: 274 10*3/uL (ref 150–400)
RBC: 3.63 MIL/uL — AB (ref 3.87–5.11)
RDW: 14.8 % (ref 11.5–15.5)
WBC: 12.1 10*3/uL — ABNORMAL HIGH (ref 4.0–10.5)

## 2016-04-03 LAB — BASIC METABOLIC PANEL
ANION GAP: 8 (ref 5–15)
BUN: 11 mg/dL (ref 6–20)
CALCIUM: 8.7 mg/dL — AB (ref 8.9–10.3)
CO2: 21 mmol/L — AB (ref 22–32)
Chloride: 106 mmol/L (ref 101–111)
Creatinine, Ser: 0.86 mg/dL (ref 0.44–1.00)
Glucose, Bld: 114 mg/dL — ABNORMAL HIGH (ref 65–99)
Potassium: 3.6 mmol/L (ref 3.5–5.1)
Sodium: 135 mmol/L (ref 135–145)

## 2016-04-03 NOTE — Discharge Instructions (Signed)
You were seen in the emergency department for an EKG. Your EKG shows nonspecific changes that are new compared to prior EKG in 2015. This is nothing to get alarmed about but I do recommend close follow up with your primary care physician and possibly a cardiologist for this. If you began having chest pain or chest discomfort, shortness of breath, nausea, dizziness or sudden sweating, please return to the emergency department. Your labs, chest x-ray were normal today.    Given the swelling in your right calf we recommend you returned the emergency department later today ultrasound of your leg to rule out a blood clot also known as a DVT.

## 2016-04-03 NOTE — ED Provider Notes (Signed)
By signing my name below, I, Irene Pap, attest that this documentation has been prepared under the direction and in the presence of [place], DO. Electronically Signed: Irene Pap, ED Scribe. 04/03/2016. 2:58 AM.  TIME SEEN: 2:53 AM  CHIEF COMPLAINT:  Chief Complaint  Patient presents with  . Chest Pain   HPI:  HPI Comments: Kaylee Murphy is a 55 y.o. Female with a hx of HTN, DM, and chronic pain who presents to the Emergency Department requesting an EKG. Pt was seen at the methadone clinic and was told that she needed to have an EKG performed, as it is required once a year. Pt has had right bunionectomy June 2nd by a podiatrist and is complaining of gradually worsening right foot swelling, right leg swelling, and pain to the area. She is able to ambulate in a post-op shoe. Has a wound to the top of her foot which her podiatrist is following. Despite nursing notes she adamantly denies any chest pain or chest discomfort, shortness of breath, diaphoresis, nausea, vomiting or dizziness. No fever or cough. States she is not having any symptoms other than pain in this foot and swelling in this leg but this is not why she presented to the emergency department. States she is here only for an EKG.   ROS: See HPI Constitutional: no fever  Eyes: no drainage  ENT: no runny nose   Cardiovascular:  no chest pain  Resp: no SOB  GI: no vomiting GU: no dysuria Integumentary: no rash  Allergy: no hives  Musculoskeletal: Right leg swelling  Neurological: no slurred speech ROS otherwise negative  PAST MEDICAL HISTORY/PAST SURGICAL HISTORY:  Past Medical History  Diagnosis Date  . Diabetes mellitus   . Hypertension   . Hyperlipidemia   . Chronic shoulder pain 10 years    s/p multiple b/l rotator cuff repair sx - per MRI 04/2010 -  Delamination of the infraspinatus with mild fatty atrophysuggest distal tendon tear with medial propagation. Depending onthe hardware, CT arthrography may  add information for evaluation othe rotator cuff.f   . Depression   . Chronic back pain     MEDICATIONS:  Prior to Admission medications   Medication Sig Start Date End Date Taking? Authorizing Provider  diazepam (VALIUM) 5 MG tablet Take 5 mg by mouth every 6 (six) hours as needed for muscle spasms. 08/28/13   Tatyana Kirichenko, PA-C  dicyclomine (BENTYL) 20 MG tablet Take 1 tablet (20 mg total) by mouth 2 (two) times daily. 02/17/14   Charlann Lange, PA-C  lisinopril (PRINIVIL,ZESTRIL) 10 MG tablet Take 10 mg by mouth daily.    Historical Provider, MD  ondansetron (ZOFRAN) 4 MG tablet Take 1 tablet (4 mg total) by mouth every 6 (six) hours. 02/17/14   Charlann Lange, PA-C  OxyCODONE (OXYCONTIN) 20 mg T12A 12 hr tablet Take 20 mg by mouth every 4 (four) hours.     Historical Provider, MD  pantoprazole (PROTONIX) 40 MG tablet Take 1 tablet by mouth every morning. 08/30/13   Historical Provider, MD    ALLERGIES:  Allergies  Allergen Reactions  . Cephalexin Swelling    REACTION: tongue swells and becomes disoriented  . Aspirin Nausea And Vomiting  . Codeine Itching  . Ketorolac Tromethamine Nausea And Vomiting  . Morphine And Related Other (See Comments)    headache    SOCIAL HISTORY:  Social History  Substance Use Topics  . Smoking status: Current Every Day Smoker -- 0.25 packs/day    Types: Cigarettes  .  Smokeless tobacco: Never Used     Comment: 5-6 cigs/day  . Alcohol Use: No    FAMILY HISTORY: Family History  Problem Relation Age of Onset  . Cancer Mother   . Stroke Father   . Colon cancer Neg Hx     EXAM: BP 149/87 mmHg  Pulse 65  Temp(Src) 97.4 F (36.3 C) (Oral)  Resp 26  SpO2 100%  LMP 02/13/2014 CONSTITUTIONAL: Alert and oriented and responds appropriately to questions. Well-appearing; well-nourished HEAD: Normocephalic EYES: Conjunctivae clear, PERRL ENT: normal nose; no rhinorrhea; moist mucous membranes NECK: Supple, no meningismus, no LAD  CARD: RRR;  S1 and S2 appreciated; no murmurs, no clicks, no rubs, no gallops RESP: Normal chest excursion without splinting or tachypnea; breath sounds clear and equal bilaterally; no wheezes, no rhonchi, no rales, no hypoxia or respiratory distress, speaking full sentences ABD/GI: Normal bowel sounds; non-distended; soft, non-tender, no rebound, no guarding, no peritoneal signs BACK:  The back appears normal and is non-tender to palpation, there is no CVA tenderness EXT: 3 cm open lesion noted to the dorsal aspect of the right foot just proximal to the MTPs with no erythema, warmth or purulent drainage; 2+ DP pulse; no bony deformity or tenderness; no joint effusion; compartments soft; Normal ROM in all joints; non-tender to palpation; no edema; normal capillary refill; no cyanosis, right sided calf tenderness and swelling, no left-sided calf tenderness or swelling    SKIN: Normal color for age and race; warm; no rash NEURO: Moves all extremities equally, sensation to light touch intact diffusely, cranial nerves II through XII intact PSYCH: The patient's mood and manner are appropriate. Grooming and personal hygiene are appropriate.  MEDICAL DECISION MAKING: Patient here with complaints of right-sided foot pain and Swelling after surgery by a podiatrist the beginning of June. She is tender to palpation throughout the right calf and I'm concerned for possible DVT. We'll have her return later today for an ultrasound of her leg. She states the reason she can't emergency per however was to obtain an EKG to present to her methadone clinic. She adamantly denies to me that she's had any chest pain or chest discomfort, shortness of breath, nausea, diaphoresis, dizziness. Her EKG does show nonspecific T wave abnormalities that are new compared to prior. She has a negative troponin today and a clear chest x-ray. I recommended she follow-up with cardiology as an outpatient. I have given her a copy of her EKG to take to her  methadone clinic. I do not feel this time she needs further emergent workup.  At this time, I do not feel there is any life-threatening condition present. I have reviewed and discussed all results (EKG, imaging, lab, urine as appropriate), exam findings with patient. I have reviewed nursing notes and appropriate previous records.  I feel the patient is safe to be discharged home without further emergent workup. Discussed usual and customary return precautions. Patient and family (if present) verbalize understanding and are comfortable with this plan.  Patient will follow-up with their primary care provider. If they do not have a primary care provider, information for follow-up has been provided to them. All questions have been answered.     EKG Interpretation  Date/Time:  Friday April 02 2016 23:37:57 EDT Ventricular Rate:  67 PR Interval:  166 QRS Duration: 88 QT Interval:  408 QTC Calculation: 431 R Axis:   16 Text Interpretation:  Sinus rhythm with occasional Premature ventricular complexes T wave abnormality, consider lateral ischemia Abnormal ECG new  T wave inversions in lateral leads compared to 2015 Confirmed by WARD,  DO, KRISTEN [phone]) on 04/03/2016 1:59:07 AM         EKG Interpretation  Date/Time:  Saturday April 03 2016 02:10:42 EDT Ventricular Rate:  64 PR Interval:  166 QRS Duration: 115 QT Interval:  419 QTC Calculation: 433 R Axis:   18 Text Interpretation:  Sinus rhythm Ventricular premature complex Nonspecific intraventricular conduction delay Borderline low voltage, extremity leads Nonspecific T abnormalities, anterior leads Confirmed by WARD,  DO, KRISTEN [id number]) on 04/03/2016 2:48:57 AM         I personally performed the services described in this documentation, which was scribed in my presence. The recorded information has been reviewed and is accurate.    [place], DO 04/03/16 0630

## 2016-08-18 ENCOUNTER — Other Ambulatory Visit: Payer: Self-pay | Admitting: Internal Medicine

## 2016-08-18 DIAGNOSIS — Z1231 Encounter for screening mammogram for malignant neoplasm of breast: Secondary | ICD-10-CM

## 2017-05-14 ENCOUNTER — Emergency Department (HOSPITAL_COMMUNITY)
Admission: EM | Admit: 2017-05-14 | Discharge: 2017-05-14 | Disposition: A | Discharge status: Home / Self Care | Payer: Medicare Other | Attending: Emergency Medicine | Admitting: Emergency Medicine

## 2017-05-14 ENCOUNTER — Emergency Department (HOSPITAL_COMMUNITY): Payer: Medicare Other

## 2017-05-14 ENCOUNTER — Encounter (HOSPITAL_COMMUNITY): Payer: Self-pay

## 2017-05-14 DIAGNOSIS — I10 Essential (primary) hypertension: Secondary | ICD-10-CM | POA: Diagnosis not present

## 2017-05-14 DIAGNOSIS — F17210 Nicotine dependence, cigarettes, uncomplicated: Secondary | ICD-10-CM | POA: Diagnosis not present

## 2017-05-14 DIAGNOSIS — Y92410 Unspecified street and highway as the place of occurrence of the external cause: Secondary | ICD-10-CM | POA: Diagnosis not present

## 2017-05-14 DIAGNOSIS — R0789 Other chest pain: Secondary | ICD-10-CM | POA: Diagnosis not present

## 2017-05-14 DIAGNOSIS — E119 Type 2 diabetes mellitus without complications: Secondary | ICD-10-CM | POA: Diagnosis not present

## 2017-05-14 DIAGNOSIS — Z79899 Other long term (current) drug therapy: Secondary | ICD-10-CM | POA: Diagnosis not present

## 2017-05-14 DIAGNOSIS — Z885 Allergy status to narcotic agent status: Secondary | ICD-10-CM | POA: Diagnosis not present

## 2017-05-14 DIAGNOSIS — Y998 Other external cause status: Secondary | ICD-10-CM | POA: Insufficient documentation

## 2017-05-14 DIAGNOSIS — Y939 Activity, unspecified: Secondary | ICD-10-CM | POA: Diagnosis not present

## 2017-05-14 DIAGNOSIS — M25519 Pain in unspecified shoulder: Secondary | ICD-10-CM | POA: Diagnosis not present

## 2017-05-14 MED ORDER — CYCLOBENZAPRINE HCL 5 MG PO TABS: 5.0000 mg | ORAL_TABLET | Freq: Two times a day (BID) | ORAL | 0 refills | Status: DC | PRN
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
## 2017-11-04 ENCOUNTER — Other Ambulatory Visit: Payer: Self-pay | Admitting: Internal Medicine

## 2017-11-04 DIAGNOSIS — Z1231 Encounter for screening mammogram for malignant neoplasm of breast: Secondary | ICD-10-CM

## 2017-11-22 ENCOUNTER — Ambulatory Visit: Payer: Medicare Other

## 2017-11-29 ENCOUNTER — Emergency Department (HOSPITAL_COMMUNITY): Payer: Medicare Other

## 2017-11-29 ENCOUNTER — Emergency Department (HOSPITAL_COMMUNITY)
Admission: EM | Admit: 2017-11-29 | Discharge: 2017-11-29 | Disposition: A | Discharge status: Home / Self Care | Payer: Medicare Other | Attending: Emergency Medicine | Admitting: Emergency Medicine

## 2017-11-29 ENCOUNTER — Other Ambulatory Visit: Payer: Self-pay

## 2017-11-29 DIAGNOSIS — T887XXA Unspecified adverse effect of drug or medicament, initial encounter: Secondary | ICD-10-CM | POA: Insufficient documentation

## 2017-11-29 DIAGNOSIS — Y658 Other specified misadventures during surgical and medical care: Secondary | ICD-10-CM | POA: Insufficient documentation

## 2017-11-29 DIAGNOSIS — R4182 Altered mental status, unspecified: Secondary | ICD-10-CM | POA: Insufficient documentation

## 2017-11-29 DIAGNOSIS — E119 Type 2 diabetes mellitus without complications: Secondary | ICD-10-CM | POA: Insufficient documentation

## 2017-11-29 DIAGNOSIS — T50905A Adverse effect of unspecified drugs, medicaments and biological substances, initial encounter: Secondary | ICD-10-CM | POA: Insufficient documentation

## 2017-11-29 DIAGNOSIS — F17210 Nicotine dependence, cigarettes, uncomplicated: Secondary | ICD-10-CM | POA: Diagnosis not present

## 2017-11-29 DIAGNOSIS — R55 Syncope and collapse: Secondary | ICD-10-CM | POA: Diagnosis present

## 2017-11-29 DIAGNOSIS — I10 Essential (primary) hypertension: Secondary | ICD-10-CM | POA: Insufficient documentation

## 2017-11-29 DIAGNOSIS — Z79899 Other long term (current) drug therapy: Secondary | ICD-10-CM | POA: Insufficient documentation

## 2017-11-29 DIAGNOSIS — E876 Hypokalemia: Secondary | ICD-10-CM | POA: Diagnosis not present

## 2017-11-29 LAB — CBC
HCT: 40.7 % (ref 36.0–46.0)
Hemoglobin: 12.9 g/dL (ref 12.0–15.0)
MCH: 26.1 pg (ref 26.0–34.0)
MCHC: 31.7 g/dL (ref 30.0–36.0)
MCV: 82.2 fL (ref 78.0–100.0)
PLATELETS: 292 10*3/uL (ref 150–400)
RBC: 4.95 MIL/uL (ref 3.87–5.11)
RDW: 15.6 % — ABNORMAL HIGH (ref 11.5–15.5)
WBC: 18.7 10*3/uL — AB (ref 4.0–10.5)

## 2017-11-29 LAB — RAPID URINE DRUG SCREEN, HOSP PERFORMED
AMPHETAMINES: NOT DETECTED
Barbiturates: NOT DETECTED
Benzodiazepines: POSITIVE — AB
Cocaine: NOT DETECTED
OPIATES: POSITIVE — AB
Tetrahydrocannabinol: NOT DETECTED

## 2017-11-29 LAB — COMPREHENSIVE METABOLIC PANEL
ALT: 14 U/L (ref 14–54)
AST: 21 U/L (ref 15–41)
Albumin: 4 g/dL (ref 3.5–5.0)
Alkaline Phosphatase: 98 U/L (ref 38–126)
Anion gap: 12 (ref 5–15)
BUN: 6 mg/dL (ref 6–20)
CALCIUM: 9.1 mg/dL (ref 8.9–10.3)
CHLORIDE: 103 mmol/L (ref 101–111)
CO2: 21 mmol/L — AB (ref 22–32)
CREATININE: 0.66 mg/dL (ref 0.44–1.00)
GFR calc Af Amer: >60 mL/min (ref >60)
Glucose, Bld: 166 mg/dL — ABNORMAL HIGH (ref 65–99)
Potassium: 2.9 mmol/L — ABNORMAL LOW (ref 3.5–5.1)
SODIUM: 136 mmol/L (ref 135–145)
Total Bilirubin: 0.5 mg/dL (ref 0.3–1.2)
Total Protein: 7.8 g/dL (ref 6.5–8.1)

## 2017-11-29 LAB — ETHANOL

## 2017-11-29 LAB — CBG MONITORING, ED: Glucose-Capillary: 162 mg/dL — ABNORMAL HIGH (ref 65–99)

## 2017-11-29 MED ORDER — SODIUM CHLORIDE 0.9 % IV SOLN
INTRAVENOUS | Status: DC
  Administered 2017-11-29: 20 mL/h via INTRAVENOUS

## 2017-11-29 MED ORDER — POTASSIUM CHLORIDE CRYS ER 20 MEQ PO TBCR
40.0000 meq | EXTENDED_RELEASE_TABLET | Freq: Once | ORAL | Status: AC
  Administered 2017-11-29: 40 meq via ORAL
  Filled 2017-11-29: qty 2

## 2017-11-29 NOTE — ED Provider Notes (Signed)
Blood pressure (!) 138/96, pulse 82, temperature (!) 97.5 F (36.4 C), temperature source Oral, resp. rate 19, height 5\' 6"  (1.676 m), weight 63.5 kg (140 lb), last menstrual period 02/13/2014, SpO2 98 %.  Assuming care from Dr. Ashok Cordia.  In short, Kaylee Murphy is a 57 y.o. female with a chief complaint of Loss of Consciousness .  Refer to the original H&P for additional details.  The current plan of care is to follow up patient and reassess after PO challenge.  04:26 PM I reassessed the patient.  She awakens to voice and is conversational.  She has had fluids to drink and is feeling better.  There is a family member at bedside who is able to take her home.  She is not having any vomiting here.  Respirations are even and unlabored.  She is not having any hypoxemia. Plan for discharge.   At this time, I do not feel there is any life-threatening condition present. I have reviewed and discussed all results (EKG, imaging, lab, urine as appropriate), exam findings with patient. I have reviewed nursing notes and appropriate previous records.  I feel the patient is safe to be discharged home without further emergent workup. Discussed usual and customary return precautions. Patient and family (if present) verbalize understanding and are comfortable with this plan.  Patient will follow-up with their primary care provider. If they do not have a primary care provider, information for follow-up has been provided to them. All questions have been answered.  Kaylee Quinton, MD    Kaylee Fast, MD 11/29/17 [phone]

## 2017-11-29 NOTE — ED Notes (Signed)
Frequent PVC's noted on monitor. Family remains at bedside. Pt with one occurrence on emesis. Suction set up in room.

## 2017-11-29 NOTE — ED Provider Notes (Signed)
[facility] EMERGENCY DEPARTMENT Provider Note   CSN: [id number] Arrival date & time: 11/29/17  1055     History   Chief Complaint Chief Complaint  Patient presents with  . Loss of Consciousness    HPI Kaylee Murphy is a 57 y.o. female.  Patient presents with report of decreased responsiveness at home after returning from methadone clinic. Patient has been going to clinic for years. Shortly after returning home this AM became confused, decreased responsiveness, shallow breathing, and family member administered intranasal narcan. Pt became agitated, and had episode of nausea/vomiting. On arrival to ED remains altered/confused - level 5 caveat. No report of trauma or fall. Prior to going to methadone clinic was reported at baseline, no other acute/new symptoms per family.    The history is provided by the patient, a relative and the EMS personnel. The history is limited by the condition of the patient.  Loss of Consciousness      Past Medical History:  Diagnosis Date  . Chronic back pain   . Chronic shoulder pain 10 years   s/p multiple b/l rotator cuff repair sx - per MRI 04/2010 -  Delamination of the infraspinatus with mild fatty atrophysuggest distal tendon tear with medial propagation. Depending onthe hardware, CT arthrography may add information for evaluation othe rotator cuff.f   . Depression   . Diabetes mellitus   . Hyperlipidemia   . Hypertension     Patient Active Problem List   Diagnosis Date Noted  . Abdominal pain, other specified site 11/12/2011  . Chest pain 10/23/2011  . Iron deficiency anemia 10/23/2011  . Anxiety 10/20/2011  . Preventative health care 10/20/2011  . HYPERLIPIDEMIA 02/13/2010  . DEPRESSION, MAJOR 07/24/2009  . DM 06/11/2009  . Essential hypertension, benign 06/11/2009  . ROTATOR CUFF REPAIR, HX OF 06/11/2009    Past Surgical History:  Procedure Laterality Date  . BREAST REDUCTION SURGERY  1996  . ROTATOR CUFF  REPAIR Right 2005  . ROTATOR CUFF REPAIR Right 2004  . ROTATOR CUFF REPAIR Left 2007  . TUBAL LIGATION  1991    OB History    No data available       Home Medications    Prior to Admission medications   Medication Sig Start Date End Date Taking? Authorizing Provider  cyclobenzaprine (FLEXERIL) 5 MG tablet Take 1 tablet (5 mg total) by mouth 2 (two) times daily as needed for muscle spasms. 05/14/17   Gareth Morgan, MD  diazepam (VALIUM) 5 MG tablet Take 5 mg by mouth every 6 (six) hours as needed for muscle spasms. 08/28/13   Kirichenko, Tatyana, PA-C  dicyclomine (BENTYL) 20 MG tablet Take 1 tablet (20 mg total) by mouth 2 (two) times daily. 02/17/14   Charlann Lange, PA-C  lisinopril (PRINIVIL,ZESTRIL) 10 MG tablet Take 10 mg by mouth daily.    [provider]  ondansetron (ZOFRAN) 4 MG tablet Take 1 tablet (4 mg total) by mouth every 6 (six) hours. 02/17/14   Charlann Lange, PA-C  OxyCODONE (OXYCONTIN) 20 mg T12A 12 hr tablet Take 20 mg by mouth every 4 (four) hours.     [provider]  pantoprazole (PROTONIX) 40 MG tablet Take 1 tablet by mouth every morning. 08/30/13   [provider]    Family History Family History  Problem Relation Age of Onset  . Cancer Mother   . Stroke Father   . Colon cancer Neg Hx     Social History Social History   Tobacco  Use  . Smoking status: Current Every Day Smoker    Packs/day: 0.25    Types: Cigarettes  . Smokeless tobacco: Never Used  . Tobacco comment: 5-6 cigs/day  Substance Use Topics  . Alcohol use: No  . Drug use: No     Allergies   Cephalexin; Aspirin; Codeine; Ketorolac tromethamine; and Morphine and related   Review of Systems Review of Systems  Unable to perform ROS: Mental status change  Cardiovascular: Positive for syncope.  pt unresponsive - level 5 caveat.    Physical Exam Updated Vital Signs BP (!) 211/75 (BP Location: Right Arm)   Pulse 93   Temp (!) 97.5 F (36.4 C) (Oral)    Resp (!) 28   Ht 1.676 m (5\' 6" )   Wt 63.5 kg (140 lb)   LMP 02/13/2014   SpO2 94%   BMI 22.60 kg/m   Physical Exam  Constitutional: She appears well-developed and well-nourished. No distress.  HENT:  Head: Atraumatic.  Eyes: Conjunctivae are normal. Pupils are equal, round, and reactive to light. No scleral icterus.  Neck: Neck supple. No tracheal deviation present.  No stiffness or rigidity  Cardiovascular: Normal rate, regular rhythm, normal heart sounds and intact distal pulses. Exam reveals no gallop and no friction rub.  No murmur heard. Pulmonary/Chest: Effort normal and breath sounds normal. No respiratory distress.  Abdominal: Soft. Normal appearance and bowel sounds are normal. She exhibits no distension. There is no tenderness.  Genitourinary:  Genitourinary Comments: No cva tenderness  Musculoskeletal: She exhibits no edema.  Neurological: She is alert.  Pt moves bilateral extremities purposefully, but not following commands.   Skin: Skin is warm and dry. No rash noted. She is not diaphoretic.  Psychiatric:  Patient altered/appears restless.   Nursing note and vitals reviewed.    ED Treatments / Results  Labs (all labs ordered are listed, but only abnormal results are displayed) Results for orders placed or performed during the hospital encounter of 11/29/17  Ethanol  Result Value Ref Range   Alcohol, Ethyl (B) <10 <10 mg/dL  CBC  Result Value Ref Range   WBC 18.7 (H) 4.0 - 10.5 K/uL   RBC 4.95 3.87 - 5.11 MIL/uL   Hemoglobin 12.9 12.0 - 15.0 g/dL   HCT 40.7 36.0 - 46.0 %   MCV 82.2 78.0 - 100.0 fL   MCH 26.1 26.0 - 34.0 pg   MCHC 31.7 30.0 - 36.0 g/dL   RDW 15.6 (H) 11.5 - 15.5 %   Platelets 292 150 - 400 K/uL  Comprehensive metabolic panel  Result Value Ref Range   Sodium 136 135 - 145 mmol/L   Potassium 2.9 (L) 3.5 - 5.1 mmol/L   Chloride 103 101 - 111 mmol/L   CO2 21 (L) 22 - 32 mmol/L   Glucose, Bld 166 (H) 65 - 99 mg/dL   BUN 6 6 - 20 mg/dL    Creatinine, Ser 0.66 0.44 - 1.00 mg/dL   Calcium 9.1 8.9 - 10.3 mg/dL   Total Protein 7.8 6.5 - 8.1 g/dL   Albumin 4.0 3.5 - 5.0 g/dL   AST 21 15 - 41 U/L   ALT 14 14 - 54 U/L   Alkaline Phosphatase 98 38 - 126 U/L   Total Bilirubin 0.5 0.3 - 1.2 mg/dL   GFR calc non Af Amer >60 >60 mL/min   GFR calc Af Amer >60 >60 mL/min   Anion gap 12 5 - 15  CBG monitoring, ED  Result Value Ref  Range   Glucose-Capillary 162 (H) 65 - 99 mg/dL   Ct Head Wo Contrast  Result Date: 11/29/2017 CLINICAL DATA:  Decreased level of consciousness with altered mental status. EXAM: CT HEAD WITHOUT CONTRAST TECHNIQUE: Contiguous axial images were obtained from the base of the skull through the vertex without intravenous contrast. COMPARISON:  05/14/2017 FINDINGS: Brain: There is no evidence for acute hemorrhage, hydrocephalus, mass lesion, or abnormal extra-axial fluid collection. No definite CT evidence for acute infarction. Diffuse loss of parenchymal volume is consistent with atrophy. Vascular: No hyperdense vessel or unexpected calcification. Skull: No evidence for fracture. No worrisome lytic or sclerotic lesion. Sinuses/Orbits: The visualized paranasal sinuses and mastoid air cells are clear. Visualized portions of the globes and intraorbital fat are unremarkable. Other: None. IMPRESSION: Stable.  No acute intracranial abnormality. Electronically Signed   By: Misty Stanley M.D.   On: 11/29/2017 12:07   Dg Chest Port 1 View  Result Date: 11/29/2017 CLINICAL DATA:  Unresponsive.  Hypertension. EXAM: PORTABLE CHEST 1 VIEW COMPARISON:  September 13, 2016 FINDINGS: There is no edema or consolidation. Heart is borderline enlarged with pulmonary vascularity within normal limits. No adenopathy. No bone lesions. There is aortic atherosclerosis. IMPRESSION: Borderline cardiomegaly. No edema or consolidation. There is aortic atherosclerosis. Aortic Atherosclerosis (ICD10-I70.0). Electronically Signed   By: Lowella Grip III  M.D.   On: 11/29/2017 11:43    EKG  EKG Interpretation  Date/Time:  Tuesday November 29 2017 11:06:10 EDT Ventricular Rate:  98 PR Interval:    QRS Duration: 102 QT Interval:  407 QTC Calculation: 446 R Axis:   104 Text Interpretation:  Sinus rhythm with Premature ventricular complexes Rightward axis Non-specific ST-t changes Confirmed by Lajean Saver [phone]) on 11/29/2017 11:24:34 AM       Radiology Ct Head Wo Contrast  Result Date: 11/29/2017 CLINICAL DATA:  Decreased level of consciousness with altered mental status. EXAM: CT HEAD WITHOUT CONTRAST TECHNIQUE: Contiguous axial images were obtained from the base of the skull through the vertex without intravenous contrast. COMPARISON:  05/14/2017 FINDINGS: Brain: There is no evidence for acute hemorrhage, hydrocephalus, mass lesion, or abnormal extra-axial fluid collection. No definite CT evidence for acute infarction. Diffuse loss of parenchymal volume is consistent with atrophy. Vascular: No hyperdense vessel or unexpected calcification. Skull: No evidence for fracture. No worrisome lytic or sclerotic lesion. Sinuses/Orbits: The visualized paranasal sinuses and mastoid air cells are clear. Visualized portions of the globes and intraorbital fat are unremarkable. Other: None. IMPRESSION: Stable.  No acute intracranial abnormality. Electronically Signed   By: Misty Stanley M.D.   On: 11/29/2017 12:07   Dg Chest Port 1 View  Result Date: 11/29/2017 CLINICAL DATA:  Unresponsive.  Hypertension. EXAM: PORTABLE CHEST 1 VIEW COMPARISON:  September 13, 2016 FINDINGS: There is no edema or consolidation. Heart is borderline enlarged with pulmonary vascularity within normal limits. No adenopathy. No bone lesions. There is aortic atherosclerosis. IMPRESSION: Borderline cardiomegaly. No edema or consolidation. There is aortic atherosclerosis. Aortic Atherosclerosis (ICD10-I70.0). Electronically Signed   By: Lowella Grip III M.D.   On: 11/29/2017 11:43     Procedures Procedures (including critical care time)  Medications Ordered in ED Medications  0.9 %  sodium chloride infusion (not administered)     Initial Impression / Assessment and Plan / ED Course  I have reviewed the triage vital signs and the nursing notes.  Pertinent labs & imaging results that were available during my care of the patient were reviewed by me and considered in my  medical decision making (see chart for details).  Iv ns. Continuous pulse ox and monitor. o2 [place]. Pcxr. Stat labs.   Reviewed nursing notes and prior charts for additional history.   Ems per report had given patient versed 5 mg iv due to combativeness post receiving the narcan at home.  Pt is sleepy in ED, but easily arousable, and responds to questions. No resp depression.   Ambulate in ED. Po fluids.  No recurrent nv.   Patient content, alert, vitals normal.  Suspect earlier symptoms caused by overmedication/accidental.     Final Clinical Impressions(s) / ED Diagnoses   Final diagnoses:  None    ED Discharge Orders    None       Lajean Saver, MD 11/29/17 1451

## 2017-11-29 NOTE — ED Triage Notes (Signed)
PT returned home from Methadone clinic unresponsive by family. Family reports Pt drove self to and from  Clinic . Pt was given 4 mg Zofran IV , 567ml NS , 5 of versed.

## 2017-11-29 NOTE — Discharge Instructions (Addendum)
It was our pleasure to provide your ER care today - we hope that you feel better.  You are noted to be on several medications with sedating side effects - avoid taking your home opiate pain medication (oxycodone) along with other sedating medications (valium, flexeril). Also avoid taking any of these medications at/around the time you go to the methadone clinic. For your health and well-being, discuss with your doctor tapering off of all of these sedating medications.   If excessively sedated after any future use of opiate type medication, use/give narcan as you did today, and seek medical attention right away.   No driving for the next 10 hours, or any time when taking sedating medication.  From today's lab tests, your potassium level is low (2.9) - eat plenty of fruits and vegetables, and follow up with your doctor in 1 week.   Follow up with your doctor in the next 1-2 days for recheck.  Return to ER if worse, new symptoms, fevers, difficulty breathing, other concern.

## 2017-11-29 NOTE — ED Notes (Signed)
Pt states she feels much better. Able to sit up in the bed without assistance.

## 2017-12-13 ENCOUNTER — Encounter: Payer: Self-pay | Admitting: Internal Medicine

## 2018-10-19 ENCOUNTER — Encounter: Payer: Self-pay | Admitting: Gastroenterology

## 2018-11-03 ENCOUNTER — Encounter: Payer: Self-pay | Admitting: Gastroenterology

## 2018-11-21 ENCOUNTER — Ambulatory Visit (AMBULATORY_SURGERY_CENTER): Payer: Self-pay

## 2018-11-21 ENCOUNTER — Other Ambulatory Visit: Payer: Self-pay

## 2018-11-21 VITALS — Ht 65.0 in | Wt 155.4 lb

## 2018-11-21 DIAGNOSIS — Z86010 Personal history of colonic polyps: Secondary | ICD-10-CM

## 2018-11-21 MED ORDER — PEG 3350-KCL-NA BICARB-NACL 420 G PO SOLR: 4000.0000 mL | Freq: Once | ORAL | 0 refills | Status: AC

## 2018-11-21 NOTE — Progress Notes (Signed)
No egg or soy allergy known to patient  No issues with past sedation with any surgeries  or procedures, no intubation problems  No diet pills per patient No home 02 use per patient  No blood thinners per patient  Pt has problems with constipation given 2 day prep No A fib or A flutter  EMMI video sent to pt's e mail

## 2018-12-04 ENCOUNTER — Encounter: Payer: Medicare Other | Admitting: Gastroenterology

## 2019-02-20 ENCOUNTER — Other Ambulatory Visit: Payer: Self-pay

## 2019-02-20 ENCOUNTER — Telehealth: Payer: Self-pay | Admitting: *Deleted

## 2019-02-20 ENCOUNTER — Ambulatory Visit: Payer: Medicare Other | Admitting: *Deleted

## 2019-02-20 NOTE — Telephone Encounter (Signed)
Pt no showed 10 am PV- I called her at 955 am, no answer, LM to return my call.  I called her at 1010 am,LM to return my call I called her at 1055 am and LM she needs to CB by 5 pm today to RS her PV or her PV and colon would be cancelled No return call at 5 pm- mailed NS letter, cancelled PV and colon 6-23 [name]

## 2019-03-06 ENCOUNTER — Encounter: Payer: Medicare Other | Admitting: Gastroenterology

## 2019-03-28 ENCOUNTER — Encounter: Payer: Self-pay | Admitting: Internal Medicine

## 2019-05-29 ENCOUNTER — Other Ambulatory Visit: Payer: Self-pay | Admitting: Internal Medicine

## 2019-05-29 DIAGNOSIS — Z1231 Encounter for screening mammogram for malignant neoplasm of breast: Secondary | ICD-10-CM

## 2019-07-13 ENCOUNTER — Ambulatory Visit: Payer: Medicare Other

## 2021-03-26 ENCOUNTER — Ambulatory Visit: Payer: Medicare Other | Admitting: Podiatry

## 2021-04-18 ENCOUNTER — Encounter: Payer: Self-pay | Admitting: Gastroenterology

## 2021-05-04 ENCOUNTER — Ambulatory Visit: Payer: Medicare Other | Admitting: Podiatry

## 2021-09-28 ENCOUNTER — Other Ambulatory Visit (HOSPITAL_COMMUNITY): Payer: Self-pay | Admitting: Registered Nurse

## 2021-09-28 DIAGNOSIS — I10 Essential (primary) hypertension: Secondary | ICD-10-CM

## 2021-10-06 ENCOUNTER — Encounter (HOSPITAL_COMMUNITY): Payer: Self-pay | Admitting: Registered Nurse

## 2022-02-09 ENCOUNTER — Encounter: Payer: Self-pay | Admitting: Registered Nurse

## 2022-02-15 ENCOUNTER — Encounter (HOSPITAL_COMMUNITY): Payer: Self-pay

## 2022-02-15 ENCOUNTER — Emergency Department (HOSPITAL_COMMUNITY)
Admission: EM | Admit: 2022-02-15 | Discharge: 2022-02-16 | Disposition: A | Discharge status: Home / Self Care | Payer: Medicare Other | Attending: Emergency Medicine | Admitting: Emergency Medicine

## 2022-02-15 DIAGNOSIS — E119 Type 2 diabetes mellitus without complications: Secondary | ICD-10-CM | POA: Diagnosis not present

## 2022-02-15 DIAGNOSIS — I10 Essential (primary) hypertension: Secondary | ICD-10-CM | POA: Insufficient documentation

## 2022-02-15 DIAGNOSIS — Z79899 Other long term (current) drug therapy: Secondary | ICD-10-CM | POA: Diagnosis not present

## 2022-02-15 DIAGNOSIS — H532 Diplopia: Secondary | ICD-10-CM | POA: Insufficient documentation

## 2022-02-15 DIAGNOSIS — Z79891 Long term (current) use of opiate analgesic: Secondary | ICD-10-CM | POA: Insufficient documentation

## 2022-02-15 DIAGNOSIS — Y900 Blood alcohol level of less than 20 mg/100 ml: Secondary | ICD-10-CM | POA: Insufficient documentation

## 2022-02-15 DIAGNOSIS — F17210 Nicotine dependence, cigarettes, uncomplicated: Secondary | ICD-10-CM | POA: Insufficient documentation

## 2022-02-15 DIAGNOSIS — R42 Dizziness and giddiness: Secondary | ICD-10-CM

## 2022-02-15 DIAGNOSIS — I6521 Occlusion and stenosis of right carotid artery: Secondary | ICD-10-CM | POA: Insufficient documentation

## 2022-02-15 LAB — CBC
HCT: 35.4 % — ABNORMAL LOW (ref 36.0–46.0)
Hemoglobin: 11.1 g/dL — ABNORMAL LOW (ref 12.0–15.0)
MCH: 27.3 pg (ref 26.0–34.0)
MCHC: 31.4 g/dL (ref 30.0–36.0)
MCV: 87.2 fL (ref 80.0–100.0)
Platelets: 275 10*3/uL (ref 150–400)
RBC: 4.06 MIL/uL (ref 3.87–5.11)
RDW: 14.3 % (ref 11.5–15.5)
WBC: 13.3 10*3/uL — ABNORMAL HIGH (ref 4.0–10.5)
nRBC: 0 % (ref 0.0–0.2)

## 2022-02-15 LAB — BASIC METABOLIC PANEL
Anion gap: 7 (ref 5–15)
BUN: 8 mg/dL (ref 6–20)
CO2: 28 mmol/L (ref 22–32)
Calcium: 8.9 mg/dL (ref 8.9–10.3)
Chloride: 105 mmol/L (ref 98–111)
Creatinine, Ser: 0.62 mg/dL (ref 0.44–1.00)
GFR, Estimated: >60 mL/min (ref >60)
Glucose, Bld: 100 mg/dL — ABNORMAL HIGH (ref 70–99)
Potassium: 3.7 mmol/L (ref 3.5–5.1)
Sodium: 140 mmol/L (ref 135–145)

## 2022-02-15 MED ORDER — MECLIZINE HCL 25 MG PO TABS
25.0000 mg | ORAL_TABLET | Freq: Once | ORAL | Status: AC
  Administered 2022-02-15: 25 mg via ORAL
  Filled 2022-02-15: qty 1

## 2022-02-15 NOTE — ED Provider Triage Note (Signed)
Emergency Medicine Provider Triage Evaluation Note  Kaylee Murphy , a 61 y.o. female  was evaluated in triage.  Pt complains of dizziness.  Patient reports that prior to arrival she was driving on the street when she had sudden onset dizzy spell, double vision.  Patient reports that these dizzy spells been going on for "many years".  The patient reports the last time she had dizzy spell was 1 week ago.  Patient denies any formal diagnosis of vertigo.  Patient denies chest pain, shortness of breath, weakness, numbness.  Review of Systems  Positive:  Negative:   Physical Exam  BP (!) 156/78 (BP Location: Left Arm)   Pulse 68   Temp 98.1 F (36.7 C) (Oral)   Resp 20   LMP 02/13/2014   SpO2 100%  Gen:   Awake, no distress   Resp:  Normal effort  MSK:   Moves extremities without difficulty  Other:  Lung sounds clear.  No focal neurodeficits on examination.  Medical Decision Making  Medically screening exam initiated at 4:10 PM.  Appropriate orders placed.  Kaylee Murphy was informed that the remainder of the evaluation will be completed by another provider, this initial triage assessment does not replace that evaluation, and the importance of remaining in the ED until their evaluation is complete.     Azucena Cecil, PA-C 02/15/22 1612

## 2022-02-15 NOTE — ED Triage Notes (Signed)
Pt arrived via EMS, was driving in car, became dizzy and had blurred vision.

## 2022-02-16 ENCOUNTER — Emergency Department (HOSPITAL_COMMUNITY): Payer: Medicare Other

## 2022-02-16 ENCOUNTER — Encounter (HOSPITAL_COMMUNITY): Payer: Self-pay

## 2022-02-16 ENCOUNTER — Other Ambulatory Visit: Payer: Self-pay

## 2022-02-16 DIAGNOSIS — H532 Diplopia: Secondary | ICD-10-CM | POA: Diagnosis not present

## 2022-02-16 LAB — APTT: aPTT: 33 seconds (ref 24–36)

## 2022-02-16 LAB — URINALYSIS, ROUTINE W REFLEX MICROSCOPIC
Bilirubin Urine: NEGATIVE
Glucose, UA: 50 mg/dL — AB
Hgb urine dipstick: NEGATIVE
Ketones, ur: NEGATIVE mg/dL
Leukocytes,Ua: NEGATIVE
Nitrite: NEGATIVE
Protein, ur: NEGATIVE mg/dL
Specific Gravity, Urine: 1.006 (ref 1.005–1.030)
pH: 7 (ref 5.0–8.0)

## 2022-02-16 LAB — DIFFERENTIAL
Abs Immature Granulocytes: 0.04 10*3/uL (ref 0.00–0.07)
Basophils Absolute: 0.1 10*3/uL (ref 0.0–0.1)
Basophils Relative: 1 %
Eosinophils Absolute: 0.1 10*3/uL (ref 0.0–0.5)
Eosinophils Relative: 1 %
Immature Granulocytes: 0 %
Lymphocytes Relative: 34 %
Lymphs Abs: 4.7 10*3/uL — ABNORMAL HIGH (ref 0.7–4.0)
Monocytes Absolute: 0.8 10*3/uL (ref 0.1–1.0)
Monocytes Relative: 5 %
Neutro Abs: 8.2 10*3/uL — ABNORMAL HIGH (ref 1.7–7.7)
Neutrophils Relative %: 59 %

## 2022-02-16 LAB — PROTIME-INR
INR: 1.1 (ref 0.8–1.2)
Prothrombin Time: 13.8 seconds (ref 11.4–15.2)

## 2022-02-16 LAB — I-STAT CHEM 8, ED
BUN: 6 mg/dL (ref 6–20)
Calcium, Ion: 1.2 mmol/L (ref 1.15–1.40)
Chloride: 100 mmol/L (ref 98–111)
Creatinine, Ser: 0.6 mg/dL (ref 0.44–1.00)
Glucose, Bld: 159 mg/dL — ABNORMAL HIGH (ref 70–99)
HCT: 39 % (ref 36.0–46.0)
Hemoglobin: 13.3 g/dL (ref 12.0–15.0)
Potassium: 3.4 mmol/L — ABNORMAL LOW (ref 3.5–5.1)
Sodium: 140 mmol/L (ref 135–145)
TCO2: 28 mmol/L (ref 22–32)

## 2022-02-16 LAB — RAPID URINE DRUG SCREEN, HOSP PERFORMED
Amphetamines: NOT DETECTED
Barbiturates: NOT DETECTED
Benzodiazepines: POSITIVE — AB
Cocaine: NOT DETECTED
Opiates: NOT DETECTED
Tetrahydrocannabinol: NOT DETECTED

## 2022-02-16 LAB — ETHANOL: Alcohol, Ethyl (B): <10 mg/dL (ref <10)

## 2022-02-16 MED ORDER — METHADONE HCL 10 MG PO TABS
80.0000 mg | ORAL_TABLET | Freq: Every day | ORAL | Status: DC
  Administered 2022-02-16: 80 mg via ORAL
  Filled 2022-02-16: qty 8

## 2022-02-16 MED ORDER — IOHEXOL 350 MG/ML SOLN
100.0000 mL | Freq: Once | INTRAVENOUS | Status: AC | PRN
  Administered 2022-02-16: 100 mL via INTRAVENOUS

## 2022-02-16 MED ORDER — GABAPENTIN 300 MG PO CAPS
400.0000 mg | ORAL_CAPSULE | Freq: Three times a day (TID) | ORAL | Status: DC
  Administered 2022-02-16: 400 mg via ORAL
  Filled 2022-02-16: qty 1

## 2022-02-16 MED ORDER — LORAZEPAM 2 MG/ML IJ SOLN
1.0000 mg | Freq: Once | INTRAMUSCULAR | Status: AC | PRN
Start: 2022-02-16 — End: 2022-02-16
  Administered 2022-02-16: 1 mg via INTRAVENOUS
  Filled 2022-02-16: qty 1

## 2022-02-16 NOTE — ED Notes (Signed)
Patient requesting daily medications including her methadone. RN updated EDP. EDP ordering patients gabapentin and states once pharmacy tech verify's patient methadone dosage will order that. Patient updated on plan.

## 2022-02-16 NOTE — ED Notes (Signed)
Patient transported to MRI 

## 2022-02-16 NOTE — ED Notes (Signed)
RN back into room to speak with patient. Patient sitting in chair in corner of room and removed all monitoring cords. Patient states "I have been here for so long and I feel miserable. I want to leave." RN informed patient on waiting on MRI. RN ordered breakfast tray for patient. Pharmacy tech states calling [facility] to verify methadone dose at this time.

## 2022-02-16 NOTE — ED Provider Notes (Signed)
[facility] Emergency Department Provider Note MRN:  [id number]  Arrival date & time: 02/16/22     Chief Complaint   Double vision History of Present Illness   Kaylee Murphy is a 61 y.o. year-old female with a history of hypertension, diabetes presenting to the ED with chief complaint of double vision.  Sudden onset double vision today while driving.  Occurred at noon, has been constant since then.  Present only with both eyes open.  Also has noticed some right leg weakness, unsure when that started.  Had some pressure in her head during onset of symptoms.  No chest pain or shortness of breath, no abdominal pain.  Review of Systems  A thorough review of systems was obtained and all systems are negative except as noted in the HPI and PMH.   Patient's Health History    Past Medical History:  Diagnosis Date   Anemia    Anxiety    Chronic back pain    Chronic shoulder pain 10 years   s/p multiple b/l rotator cuff repair sx - per MRI 04/2010 -  Delamination of the infraspinatus with mild fatty atrophysuggest distal tendon tear with medial propagation. Depending onthe hardware, CT arthrography may add information for evaluation othe rotator cuff.f    Depression    Diabetes mellitus    Hyperlipidemia    Hypertension     Past Surgical History:  Procedure Laterality Date   BREAST REDUCTION SURGERY  1996   ROTATOR CUFF REPAIR Right 2005   ROTATOR CUFF REPAIR Right 2004   ROTATOR CUFF REPAIR Left 2007   TUBAL LIGATION  1991    Family History  Problem Relation Age of Onset   Cancer Mother    Stroke Father    Colon cancer Neg Hx    Esophageal cancer Neg Hx    Rectal cancer Neg Hx    Stomach cancer Neg Hx     Social History   Socioeconomic History   Marital status: Single    Spouse name: Not on file   Number of children: Not on file   Years of education: Not on file   Highest education level: Not on file  Occupational History   Occupation: on  disability    Employer: UNEMPLOYED   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 Recently moved to [place] from [place] in 2011.   Social Determinants of Health   Financial Resource Strain: Not on file  Food Insecurity: Not on file  Transportation Needs: Not on file  Physical Activity: Not on file  Stress: Not on file  Social Connections: Not on file  Intimate Partner Violence: Not on file     Physical Exam   Vitals:   02/16/22 0335 02/16/22 0430  BP: (!) 194/90 (!) 183/90  Pulse: (!) 57 (!) 28  Resp: (!) 22 (!) 22  Temp:    SpO2: 100% 100%    CONSTITUTIONAL: Well-appearing, NAD NEURO/PSYCH:  Alert and oriented x 3, subtle reduced strength to the right lower extremity EYES:  eyes equal and  reactive, normal extraocular movements ENT/NECK:  no LAD, no JVD CARDIO: Regular rate, well-perfused, normal S1 and S2 PULM:  CTAB no wheezing or rhonchi GI/GU:  non-distended, non-tender MSK/SPINE:  No gross deformities, no edema SKIN:  no rash, atraumatic   *Additional and/or pertinent findings included in MDM below  Diagnostic and Interventional Summary    EKG Interpretation  Date/Time:  Monday February 15 2022 16:21:44 EDT Ventricular Rate:  56 PR Interval:  157 QRS Duration: 119 QT Interval:  441 QTC Calculation: 426 R Axis:   82 Text Interpretation: Sinus rhythm Probable left atrial enlargement Nonspecific intraventricular conduction delay Low voltage, precordial leads Anteroseptal infarct, old Abnormal T, consider ischemia, diffuse leads Confirmed by Gerlene Fee [phone]) on 02/16/2022 2:21:05 AM       Labs  Reviewed  CBC - Abnormal; Notable for the following components:      Result Value   WBC 13.3 (*)    Hemoglobin 11.1 (*)    HCT 35.4 (*)    All other components within normal limits  BASIC METABOLIC PANEL - Abnormal; Notable for the following components:   Glucose, Bld 100 (*)    All other components within normal limits  DIFFERENTIAL - Abnormal; Notable for the following components:   Neutro Abs 8.2 (*)    Lymphs Abs 4.7 (*)    All other components within normal limits  RAPID URINE DRUG SCREEN, HOSP PERFORMED - Abnormal; Notable for the following components:   Benzodiazepines POSITIVE (*)    All other components within normal limits  URINALYSIS, ROUTINE W REFLEX MICROSCOPIC - Abnormal; Notable for the following components:   Color, Urine STRAW (*)    Glucose, UA 50 (*)    All other components within normal limits  I-STAT CHEM 8, ED - Abnormal; Notable for the following components:   Potassium 3.4 (*)    Glucose, Bld 159 (*)    All other components within normal limits  ETHANOL  PROTIME-INR  APTT    CT ANGIO HEAD NECK W WO CM  Final Result    MR BRAIN WO CONTRAST    (Results Pending)  MR ORBITS WO CONTRAST    (Results Pending)    Medications  meclizine (ANTIVERT) tablet 25 mg (25 mg Oral Given 02/15/22 1627)  iohexol (OMNIPAQUE) 350 MG/ML injection 100 mL (100 mLs Intravenous Contrast Given 02/16/22 0352)     Procedures  /  Critical Care .Critical Care Performed by: Maudie Flakes, MD Authorized by: Maudie Flakes, MD   Critical care provider statement:    Critical care time (minutes):  35   Critical care was necessary to treat or prevent imminent or life-threatening deterioration of the following conditions: Concern for acute ischemic stroke.   Critical care was time spent personally by me on the following activities:  Development of treatment plan with patient or surrogate, discussions with consultants, evaluation of patient's response to treatment, examination of  patient, ordering and review of laboratory studies, ordering and review of radiographic studies, ordering and performing treatments and interventions, pulse oximetry, re-evaluation of patient's condition and review of old charts  ED Course and Medical Decision Making  Initial Impression and Ddx Acute onset and constant double vision, last known well noon yesterday.  Seems to have some subtle right lower extremity weakness as well.  No visual field cuts, no aphasia, no neglect.  Concern for acute ischemic stroke, awaiting CTA imaging, will likely need MRI.  Past medical/surgical history that increases complexity of ED encounter: Hypertension, diabetes  Interpretation of Diagnostics I personally reviewed the EKG and my interpretation is as follows: Sinus rhythm  Labs are reassuring with no significant blood count or electrolyte disturbance, CTA demonstrating some carotid disease, no acute process.  Awaiting MRI  Patient Reassessment and Ultimate Disposition/Management     Signed out to oncoming provider at shift change.  Patient management required discussion with the following services or consulting groups:  None  Complexity of Problems Addressed Acute illness or injury that poses threat of life of bodily function  Additional Data Reviewed and Analyzed Further history obtained from: Prior labs/imaging results  Additional Factors Impacting ED Encounter Risk Consideration of hospitalization  Barth Kirks. Sedonia Small, [place] [email or web address]  Final Clinical Impressions(s) / ED Diagnoses     ICD-10-CM   1. Diplopia  H53.2       ED Discharge Orders     None        Discharge Instructions Discussed with and Provided to Patient:   Discharge Instructions   None      Maudie Flakes, MD 02/16/22 [phone]

## 2022-02-16 NOTE — Discharge Instructions (Signed)
There is a telephone number below to call for neurology follow-up.  Return to the emergency department at any time for any new or worsening symptoms of concern.

## 2022-02-16 NOTE — ED Provider Notes (Signed)
Care of patient assumed from Dr. Sedonia Small at 7:30 AM.  This patient had acute onset of diplopia yesterday at noon.  She is currently awaiting MRI study. Physical Exam  BP (!) 157/61   Pulse (!) 50   Temp 98.1 F (36.7 C) (Oral)   Resp 18   LMP 02/13/2014   SpO2 100%   Physical Exam Vitals and nursing note reviewed.  Constitutional:      General: She is not in acute distress.    Appearance: Normal appearance. She is well-developed. She is not ill-appearing, toxic-appearing or diaphoretic.  HENT:     Head: Normocephalic and atraumatic.     Right Ear: External ear normal.     Left Ear: External ear normal.     Nose: Nose normal.     Mouth/Throat:     Mouth: Mucous membranes are moist.  Eyes:     Extraocular Movements: Extraocular movements intact.     Conjunctiva/sclera: Conjunctivae normal.  Cardiovascular:     Rate and Rhythm: Normal rate and regular rhythm.  Pulmonary:     Effort: Pulmonary effort is normal. No respiratory distress.  Abdominal:     Palpations: Abdomen is soft.  Musculoskeletal:        General: No swelling. Normal range of motion.     Cervical back: Normal range of motion and neck supple.  Skin:    General: Skin is warm and dry.     Capillary Refill: Capillary refill takes less than 2 seconds.  Neurological:     General: No focal deficit present.     Mental Status: She is alert and oriented to person, place, and time.     Cranial Nerves: No cranial nerve deficit.     Sensory: No sensory deficit.     Motor: No weakness.     Coordination: Coordination normal.  Psychiatric:        Mood and Affect: Mood normal.        Behavior: Behavior normal.        Thought Content: Thought content normal.        Judgment: Judgment normal.    Procedures  Procedures  ED Course / MDM    Medical Decision Making Amount and/or Complexity of Data Reviewed Labs: ordered. Radiology: ordered.  Risk Prescription drug management.   On assessment, patient reports  resolution of her diplopia.  She does endorse continued mild dizziness.  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  This was confirmed with pharmacy.  Patient's morning dose was given.  She was also given her home gabapentin.  Patient underwent MRI study which was negative for any acute findings.  On further reassessment, patient continues to have resolution of her diplopia.  She endorses continued mild dizziness that is positional.  She states that the meclizine did not help her.  She declines any new prescriptions.  She was given contact information for neurology follow-up.  She was advised to return to the ED for any new or worsening symptoms.  She was discharged in stable condition.       Godfrey Pick, MD 02/16/22 [phone]

## 2022-02-19 ENCOUNTER — Inpatient Hospital Stay (HOSPITAL_COMMUNITY)
Admission: EM | Admit: 2022-02-19 | Discharge: 2022-02-22 | DRG: 065 | Disposition: A | Discharge status: Home / Self Care | Payer: Medicare Other | Attending: Internal Medicine | Admitting: Internal Medicine

## 2022-02-19 ENCOUNTER — Observation Stay (HOSPITAL_COMMUNITY): Payer: Medicare Other

## 2022-02-19 ENCOUNTER — Other Ambulatory Visit: Payer: Self-pay

## 2022-02-19 ENCOUNTER — Emergency Department (HOSPITAL_COMMUNITY): Payer: Medicare Other

## 2022-02-19 DIAGNOSIS — E119 Type 2 diabetes mellitus without complications: Secondary | ICD-10-CM | POA: Diagnosis present

## 2022-02-19 DIAGNOSIS — I6381 Other cerebral infarction due to occlusion or stenosis of small artery: Secondary | ICD-10-CM | POA: Diagnosis not present

## 2022-02-19 DIAGNOSIS — M25519 Pain in unspecified shoulder: Secondary | ICD-10-CM | POA: Diagnosis present

## 2022-02-19 DIAGNOSIS — F419 Anxiety disorder, unspecified: Secondary | ICD-10-CM | POA: Diagnosis present

## 2022-02-19 DIAGNOSIS — Z716 Tobacco abuse counseling: Secondary | ICD-10-CM

## 2022-02-19 DIAGNOSIS — Z20822 Contact with and (suspected) exposure to covid-19: Secondary | ICD-10-CM | POA: Diagnosis present

## 2022-02-19 DIAGNOSIS — Z886 Allergy status to analgesic agent status: Secondary | ICD-10-CM

## 2022-02-19 DIAGNOSIS — F32A Depression, unspecified: Secondary | ICD-10-CM | POA: Diagnosis present

## 2022-02-19 DIAGNOSIS — I10 Essential (primary) hypertension: Secondary | ICD-10-CM | POA: Diagnosis not present

## 2022-02-19 DIAGNOSIS — Z720 Tobacco use: Secondary | ICD-10-CM

## 2022-02-19 DIAGNOSIS — G8929 Other chronic pain: Secondary | ICD-10-CM

## 2022-02-19 DIAGNOSIS — Z885 Allergy status to narcotic agent status: Secondary | ICD-10-CM

## 2022-02-19 DIAGNOSIS — D509 Iron deficiency anemia, unspecified: Secondary | ICD-10-CM | POA: Diagnosis present

## 2022-02-19 DIAGNOSIS — Z79891 Long term (current) use of opiate analgesic: Secondary | ICD-10-CM

## 2022-02-19 DIAGNOSIS — R29810 Facial weakness: Secondary | ICD-10-CM | POA: Diagnosis present

## 2022-02-19 DIAGNOSIS — E785 Hyperlipidemia, unspecified: Secondary | ICD-10-CM | POA: Diagnosis not present

## 2022-02-19 DIAGNOSIS — G894 Chronic pain syndrome: Secondary | ICD-10-CM | POA: Diagnosis present

## 2022-02-19 DIAGNOSIS — R29705 NIHSS score 5: Secondary | ICD-10-CM | POA: Diagnosis present

## 2022-02-19 DIAGNOSIS — M549 Dorsalgia, unspecified: Secondary | ICD-10-CM | POA: Diagnosis present

## 2022-02-19 DIAGNOSIS — D72829 Elevated white blood cell count, unspecified: Secondary | ICD-10-CM | POA: Diagnosis present

## 2022-02-19 DIAGNOSIS — I639 Cerebral infarction, unspecified: Secondary | ICD-10-CM | POA: Diagnosis not present

## 2022-02-19 DIAGNOSIS — F17210 Nicotine dependence, cigarettes, uncomplicated: Secondary | ICD-10-CM | POA: Diagnosis present

## 2022-02-19 DIAGNOSIS — R29708 NIHSS score 8: Secondary | ICD-10-CM | POA: Diagnosis not present

## 2022-02-19 DIAGNOSIS — I672 Cerebral atherosclerosis: Secondary | ICD-10-CM | POA: Diagnosis present

## 2022-02-19 DIAGNOSIS — Z823 Family history of stroke: Secondary | ICD-10-CM

## 2022-02-19 DIAGNOSIS — I110 Hypertensive heart disease with heart failure: Secondary | ICD-10-CM | POA: Diagnosis present

## 2022-02-19 DIAGNOSIS — I5022 Chronic systolic (congestive) heart failure: Secondary | ICD-10-CM | POA: Diagnosis present

## 2022-02-19 DIAGNOSIS — G8194 Hemiplegia, unspecified affecting left nondominant side: Secondary | ICD-10-CM | POA: Diagnosis present

## 2022-02-19 DIAGNOSIS — Z79899 Other long term (current) drug therapy: Secondary | ICD-10-CM

## 2022-02-19 DIAGNOSIS — R54 Age-related physical debility: Secondary | ICD-10-CM | POA: Diagnosis present

## 2022-02-19 DIAGNOSIS — Z888 Allergy status to other drugs, medicaments and biological substances status: Secondary | ICD-10-CM

## 2022-02-19 DIAGNOSIS — H532 Diplopia: Secondary | ICD-10-CM | POA: Diagnosis present

## 2022-02-19 LAB — GLUCOSE, CAPILLARY: Glucose-Capillary: 158 mg/dL — ABNORMAL HIGH (ref 70–99)

## 2022-02-19 LAB — CBC WITH DIFFERENTIAL/PLATELET
Abs Immature Granulocytes: 0.03 10*3/uL (ref 0.00–0.07)
Basophils Absolute: 0.1 10*3/uL (ref 0.0–0.1)
Basophils Relative: 1 %
Eosinophils Absolute: 0.2 10*3/uL (ref 0.0–0.5)
Eosinophils Relative: 2 %
HCT: 36.5 % (ref 36.0–46.0)
Hemoglobin: 11.1 g/dL — ABNORMAL LOW (ref 12.0–15.0)
Immature Granulocytes: 0 %
Lymphocytes Relative: 33 %
Lymphs Abs: 3.8 10*3/uL (ref 0.7–4.0)
MCH: 27 pg (ref 26.0–34.0)
MCHC: 30.4 g/dL (ref 30.0–36.0)
MCV: 88.8 fL (ref 80.0–100.0)
Monocytes Absolute: 0.5 10*3/uL (ref 0.1–1.0)
Monocytes Relative: 5 %
Neutro Abs: 6.9 10*3/uL (ref 1.7–7.7)
Neutrophils Relative %: 59 %
Platelets: 316 10*3/uL (ref 150–400)
RBC: 4.11 MIL/uL (ref 3.87–5.11)
RDW: 14.4 % (ref 11.5–15.5)
WBC: 11.6 10*3/uL — ABNORMAL HIGH (ref 4.0–10.5)
nRBC: 0 % (ref 0.0–0.2)

## 2022-02-19 LAB — COMPREHENSIVE METABOLIC PANEL
ALT: 12 U/L (ref 0–44)
AST: 16 U/L (ref 15–41)
Albumin: 3.4 g/dL — ABNORMAL LOW (ref 3.5–5.0)
Alkaline Phosphatase: 79 U/L (ref 38–126)
Anion gap: 10 (ref 5–15)
BUN: 7 mg/dL (ref 6–20)
CO2: 23 mmol/L (ref 22–32)
Calcium: 8.8 mg/dL — ABNORMAL LOW (ref 8.9–10.3)
Chloride: 103 mmol/L (ref 98–111)
Creatinine, Ser: 0.89 mg/dL (ref 0.44–1.00)
GFR, Estimated: >60 mL/min (ref >60)
Glucose, Bld: 187 mg/dL — ABNORMAL HIGH (ref 70–99)
Potassium: 3.5 mmol/L (ref 3.5–5.1)
Sodium: 136 mmol/L (ref 135–145)
Total Bilirubin: 0.3 mg/dL (ref 0.3–1.2)
Total Protein: 7.1 g/dL (ref 6.5–8.1)

## 2022-02-19 LAB — RAPID URINE DRUG SCREEN, HOSP PERFORMED
Amphetamines: NOT DETECTED
Barbiturates: NOT DETECTED
Benzodiazepines: POSITIVE — AB
Cocaine: NOT DETECTED
Opiates: NOT DETECTED
Tetrahydrocannabinol: NOT DETECTED

## 2022-02-19 LAB — PROTIME-INR
INR: 1.1 (ref 0.8–1.2)
Prothrombin Time: 14.1 seconds (ref 11.4–15.2)

## 2022-02-19 LAB — URINALYSIS, ROUTINE W REFLEX MICROSCOPIC
Bilirubin Urine: NEGATIVE
Glucose, UA: NEGATIVE mg/dL
Hgb urine dipstick: NEGATIVE
Ketones, ur: NEGATIVE mg/dL
Nitrite: NEGATIVE
Protein, ur: NEGATIVE mg/dL
Specific Gravity, Urine: 1.012 (ref 1.005–1.030)
pH: 6 (ref 5.0–8.0)

## 2022-02-19 LAB — HEMOGLOBIN A1C
Hgb A1c MFr Bld: 6.8 % — ABNORMAL HIGH (ref 4.8–5.6)
Mean Plasma Glucose: 148.46 mg/dL

## 2022-02-19 LAB — ETHANOL: Alcohol, Ethyl (B): <10 mg/dL (ref <10)

## 2022-02-19 LAB — RESP PANEL BY RT-PCR (FLU A&B, COVID) ARPGX2
Influenza A by PCR: NEGATIVE
Influenza B by PCR: NEGATIVE
SARS Coronavirus 2 by RT PCR: NEGATIVE

## 2022-02-19 LAB — APTT: aPTT: 30 seconds (ref 24–36)

## 2022-02-19 MED ORDER — HYDRALAZINE HCL 20 MG/ML IJ SOLN: 10.0000 mg | Freq: Three times a day (TID) | INTRAMUSCULAR | Status: DC | PRN

## 2022-02-19 MED ORDER — GABAPENTIN 400 MG PO CAPS
400.0000 mg | ORAL_CAPSULE | Freq: Two times a day (BID) | ORAL | Status: DC
  Administered 2022-02-19 – 2022-02-22 (×6): 400 mg via ORAL
  Filled 2022-02-19 (×6): qty 1

## 2022-02-19 MED ORDER — ACETAMINOPHEN 325 MG PO TABS
650.0000 mg | ORAL_TABLET | ORAL | Status: DC | PRN
  Administered 2022-02-19 – 2022-02-21 (×5): 650 mg via ORAL
  Filled 2022-02-19 (×5): qty 2

## 2022-02-19 MED ORDER — ACETAMINOPHEN 160 MG/5ML PO SOLN: 650.0000 mg | ORAL | Status: DC | PRN

## 2022-02-19 MED ORDER — ROSUVASTATIN CALCIUM 20 MG PO TABS
20.0000 mg | ORAL_TABLET | Freq: Every day | ORAL | Status: DC
  Administered 2022-02-19 – 2022-02-22 (×4): 20 mg via ORAL
  Filled 2022-02-19 (×4): qty 1

## 2022-02-19 MED ORDER — ENOXAPARIN SODIUM 40 MG/0.4ML IJ SOSY
40.0000 mg | PREFILLED_SYRINGE | INTRAMUSCULAR | Status: DC
Start: 2022-02-19 — End: 2022-02-22
  Administered 2022-02-19 – 2022-02-21 (×3): 40 mg via SUBCUTANEOUS
  Filled 2022-02-19 (×3): qty 0.4

## 2022-02-19 MED ORDER — STROKE: EARLY STAGES OF RECOVERY BOOK
Freq: Once | Status: AC
  Filled 2022-02-19: qty 1

## 2022-02-19 MED ORDER — NICOTINE 14 MG/24HR TD PT24
14.0000 mg | MEDICATED_PATCH | Freq: Every day | TRANSDERMAL | Status: DC
  Administered 2022-02-19 – 2022-02-22 (×4): 14 mg via TRANSDERMAL
  Filled 2022-02-19 (×4): qty 1

## 2022-02-19 MED ORDER — ACETAMINOPHEN 650 MG RE SUPP: 650.0000 mg | RECTAL | Status: DC | PRN

## 2022-02-19 MED ORDER — INSULIN ASPART 100 UNIT/ML IJ SOLN
0.0000 [IU] | Freq: Three times a day (TID) | INTRAMUSCULAR | Status: DC
  Administered 2022-02-20: 1 [IU] via SUBCUTANEOUS
  Administered 2022-02-20: 2 [IU] via SUBCUTANEOUS
  Administered 2022-02-21: 1 [IU] via SUBCUTANEOUS
  Administered 2022-02-21 (×2): 2 [IU] via SUBCUTANEOUS

## 2022-02-19 MED ORDER — SENNOSIDES-DOCUSATE SODIUM 8.6-50 MG PO TABS
1.0000 | ORAL_TABLET | Freq: Every evening | ORAL | Status: DC | PRN
  Administered 2022-02-19: 1 via ORAL
  Filled 2022-02-19: qty 1

## 2022-02-19 MED ORDER — METHOCARBAMOL 1000 MG/10ML IJ SOLN
500.0000 mg | Freq: Once | INTRAMUSCULAR | Status: AC
  Administered 2022-02-19: 500 mg via INTRAVENOUS
  Filled 2022-02-19: qty 500

## 2022-02-19 MED ORDER — IOHEXOL 350 MG/ML SOLN
100.0000 mL | Freq: Once | INTRAVENOUS | Status: AC | PRN
Start: 2022-02-19 — End: 2022-02-19
  Administered 2022-02-19: 100 mL via INTRAVENOUS

## 2022-02-19 MED ORDER — CLOPIDOGREL BISULFATE 75 MG PO TABS
75.0000 mg | ORAL_TABLET | Freq: Every day | ORAL | Status: DC
  Administered 2022-02-19 – 2022-02-22 (×4): 75 mg via ORAL
  Filled 2022-02-19 (×4): qty 1

## 2022-02-19 MED ORDER — ASPIRIN 81 MG PO TBEC
81.0000 mg | DELAYED_RELEASE_TABLET | Freq: Every day | ORAL | Status: DC
  Administered 2022-02-19 – 2022-02-22 (×4): 81 mg via ORAL
  Filled 2022-02-19 (×5): qty 1

## 2022-02-19 NOTE — Assessment & Plan Note (Signed)
Do not have recent labs, but appears to be around baseline from years ago Continue to monitor

## 2022-02-19 NOTE — ED Provider Notes (Signed)
[place] EMERGENCY DEPARTMENT Provider Note   CSN: [id number] Arrival date & time: 02/19/22  0737     History  Chief Complaint  Patient presents with   Weakness   stroke like symptoms    Kaylee Murphy is a 61 y.o. female.  61 year female with past medical history of diabetes, hypertension, hyperlipidemia, depression, chronic back pain and anxiety presents with complaint of left-sided body weakness and numbness.  Patient states her symptoms started on Monday (4 days ago), with double vision as she was driving.  Patient then developed left-sided body weakness.  Patient was taken to [facility] emergency room where she had CT scan and MRI which were negative and she was discharged home.  Patient reports ongoing progressive weakness of her left side body, states that she is dragging her left foot.  Reports the double vision has resolved however reports her vision is now dizzy, lightheaded, spinning.  Denies similar episodes in the past, no prior CVA.  No recent falls or illness.  No other complaints or concerns.       Home Medications Prior to Admission medications   Medication Sig Start Date End Date Taking? Authorizing Provider  atenolol (TENORMIN) 50 MG tablet Take 50 mg by mouth every morning. 01/11/22  Yes [provider]  gabapentin (NEURONTIN) 400 MG capsule Take 400 mg by mouth 2 (two) times daily. 01/11/22  Yes [provider]  METHADONE HCL PO Take 80 mg by mouth every morning.    [provider]      Allergies    Cephalexin, Aspirin, Codeine, Ketorolac tromethamine, and Morphine and related    Review of Systems   Review of Systems Negative except as per HPI Physical Exam Updated Vital Signs BP (!) 178/81   Pulse (!) 47   Temp 98.9 F (37.2 C) (Oral)   Resp 13   LMP 02/13/2014   SpO2 99%  Physical Exam Vitals and nursing note reviewed.  Constitutional:      General: She is not in acute distress.    Appearance: She  is well-developed. She is not diaphoretic.  HENT:     Head: Normocephalic and atraumatic.     Mouth/Throat:     Mouth: Mucous membranes are moist.  Eyes:     Extraocular Movements: Extraocular movements intact.     Conjunctiva/sclera: Conjunctivae normal.     Pupils: Pupils are equal, round, and reactive to light.  Cardiovascular:     Rate and Rhythm: Normal rate and regular rhythm.     Heart sounds: Normal heart sounds.  Pulmonary:     Effort: Pulmonary effort is normal.     Breath sounds: Normal breath sounds.  Abdominal:     Palpations: Abdomen is soft.     Tenderness: There is no abdominal tenderness.  Musculoskeletal:     Cervical back: Neck supple.     Right lower leg: No edema.     Left lower leg: No edema.  Skin:    General: Skin is warm and dry.     Findings: No erythema or rash.  Neurological:     Mental Status: She is alert and oriented to person, place, and time.     GCS: GCS eye subscore is 4. GCS verbal subscore is 5. GCS motor subscore is 6.     Cranial Nerves: Facial asymmetry present.     Sensory: Sensory deficit present.     Motor: Weakness present.     Coordination: Coordination abnormal. Finger-Nose-Finger Test  abnormal and Heel to [facility] Test abnormal. Impaired rapid alternating movements.     Comments: Difficulty with finger to nose with left hand (had significant difficulty touching finger to nose but was able to use same hand to scratch nose earlier in exam). Difficulty with heel to shin with left foot on right shin, once heel was placed on shin, was able to move foot up/down about an inch.  Reports diminished sensation to left side body. Left mouth droop/asymmetry, normal eyebrow movement.   Psychiatric:        Behavior: Behavior normal.     ED Results / Procedures / Treatments   Labs (all labs ordered are listed, but only abnormal results are displayed) Labs Reviewed  CBC WITH DIFFERENTIAL/PLATELET - Abnormal; Notable for the following components:       Result Value   WBC 11.6 (*)    Hemoglobin 11.1 (*)    All other components within normal limits  COMPREHENSIVE METABOLIC PANEL - Abnormal; Notable for the following components:   Glucose, Bld 187 (*)    Calcium 8.8 (*)    Albumin 3.4 (*)    All other components within normal limits  RESP PANEL BY RT-PCR (FLU A&B, COVID) ARPGX2  URINALYSIS, ROUTINE W REFLEX MICROSCOPIC  ETHANOL  PROTIME-INR  APTT  RAPID URINE DRUG SCREEN, HOSP PERFORMED  HEMOGLOBIN A1C    EKG EKG Interpretation  Date/Time:  Friday February 19 2022 10:04:42 EDT Ventricular Rate:  60 PR Interval:  152 QRS Duration: 86 QT Interval:  442 QTC Calculation: 442 R Axis:   13 Text Interpretation: Normal sinus rhythm Anterior infarct , age undetermined ST & T wave abnormality, consider inferolateral ischemia Abnormal ECG When compared with ECG of 15-Feb-2022 16:21, PREVIOUS ECG IS PRESENT No significant change since last tracing Confirmed by Isla Pence [phone]) on 02/19/2022 10:51:30 AM  Radiology MR BRAIN WO CONTRAST  Result Date: 02/19/2022 CLINICAL DATA:  Neuro deficit, acute, stroke suspected EXAM: MRI HEAD WITHOUT CONTRAST TECHNIQUE: Multiplanar, multiecho pulse sequences of the brain and surrounding structures were obtained without intravenous contrast. COMPARISON:  02/16/2022 FINDINGS: Brain: New small focus of reduced diffusion at the right pontomedullary junction. There is also some involvement more posterosuperiorly along the floor of the fourth ventricle. No evidence of intracranial hemorrhage. Ventricles and sulci are stable in size and configuration. Unchanged patchy small foci of T2 hyperintensity in the supratentorial white matter that may reflect minor chronic microvascular ischemic changes. There is no intracranial mass or mass effect. No hydrocephalus or extra-axial collection. Vascular: Major vessel flow voids at the skull base are preserved. Skull and upper cervical spine: Normal marrow signal is  preserved. As noted on the prior study, a disc osteophyte complex at C3-C4 may cause significant canal narrowing. Sinuses/Orbits: Paranasal sinuses are aerated. Orbits are unremarkable. Other: Sella is expanded and "empty."  Mastoid air cells are clear. 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 Additional smaller acute infarcts more dorsally and superiorly along the floor of the fourth ventricle. Electronically Signed   By: Macy Mis M.D.   On: 02/19/2022 14:39   CT Head Wo Contrast  Result Date: 02/19/2022 CLINICAL DATA:  Weakness. EXAM: CT HEAD WITHOUT CONTRAST TECHNIQUE: Contiguous axial images were obtained from the base of the skull through the vertex without intravenous contrast. RADIATION DOSE REDUCTION: This exam was performed according to the departmental dose-optimization program which includes automated exposure control, adjustment of the mA and/or kV according to patient size and/or use of iterative reconstruction technique. COMPARISON:  MRI of the brain on 02/16/2022 FINDINGS: Brain: The brain demonstrates no evidence of hemorrhage, infarction, edema, mass effect, extra-axial fluid collection, hydrocephalus or mass lesion. Vascular: No hyperdense vessel or unexpected calcification. Skull: Normal. Negative for fracture or focal lesion. Sinuses/Orbits: No acute finding. Other: None. IMPRESSION: No acute findings by CT of the head without contrast. Electronically Signed   By: Aletta Edouard M.D.   On: 02/19/2022 10:49    Procedures Procedures    Medications Ordered in ED Medications - No data to display  ED Course/ Medical Decision Making/ A&P                           Medical Decision Making Amount and/or Complexity of Data Reviewed Radiology: ordered.   This patient presents to the ED for concern of left-sided weakness, numbness, this involves an extensive number of treatment options, and is a complaint that carries with it a high risk of  complications and morbidity.  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  Probable minor chronic microvascular ischemic changes.  MRI orbits showing no evidence of recent infarction, hemorrhage, mass   Lab Tests:  I Ordered, and personally interpreted labs.  The pertinent results include: See BC without significant findings, CMP with glucose elevated at 187, not in DKA.   Imaging Studies ordered:  I ordered imaging studies including CT head, MRI brain I independently visualized and interpreted imaging which showed no acute findings on noncontrast CT of the head. I agree with the radiologist interpretation MRI with concern for acute CVA   Cardiac Monitoring: / EKG:  The patient was maintained on a cardiac monitor.  I personally viewed and interpreted the cardiac monitored which showed an underlying rhythm of: Normal sinus rhythm, rate 60   Consultations Obtained:  I requested consultation with the hospitalist, Dr. Rogers Blocker,  and discussed lab and imaging findings as well as pertinent plan - they recommend: admission   Problem List / ED Course / Critical interventions / Medication management  61 yo female with left side facial/arm/leg weakness and diminished sensation left arm/leg/face, left side facial droop, onset 02/15/22, presented to the ER at that time and work up including MRI was negative for CVA. Presents today as her symptoms have become progressively worse since that time.  MRI today is positive for acute  infarct. Plan is to admit to hospitalist service, discussed with Dr. Leonel Ramsay, neuro hospitalist, who will consult for admission.   I have reviewed the patients home medicines and have made adjustments as needed   Social Determinants of Health:  No PCP on file   Test / Admission - Considered:  Admit for acute CVA         Final Clinical Impression(s) / ED Diagnoses Final diagnoses:  Acute CVA (cerebrovascular accident) [facility])    Rx / [place] Orders ED Discharge Orders     None         Tacy Learn, PA-C 02/19/22 1518    Isla Pence, MD 02/22/22 1608

## 2022-02-19 NOTE — Assessment & Plan Note (Signed)
61 year old female with 5 day history of vision changes, dizziness and progressive left sided weakness found to have acute infarct at the right pontomedullary junction in addition to smaller acute infarcts more dorsally and superiorly along the floor of the 4th ventricle.  -place in observation on telemetry for stroke work-up -Neurochecks per protocol -Neurology consulted -repeat CTA head/neck with new acute stroke findings  -echo -A1C/lipid panel  -start ASA '81mg'$ . Has an allergy she tells me years ago gave her nausea, but she is willing to try again. plavix per neurology  -Permissive hypertension first 24 hours <220/110 -N.p.o. until bedside swallow screen -PT/ OT/ SLP consult

## 2022-02-19 NOTE — ED Triage Notes (Signed)
Pt. Stated, Im probably having a stroke. Im weak and I feel like I'm getting weaker. I went to [facility] on Monday after a MRI and said everything was normal. On Monday I had double vision. Left side smile droop, left side weakness.

## 2022-02-19 NOTE — Assessment & Plan Note (Signed)
Allow for permissive HTN in first 24-48 hours hyrdralazine prn with parameters ordered

## 2022-02-19 NOTE — Assessment & Plan Note (Addendum)
On methadone '80mg'$  daily. Can not verify this until tomorrow. Had her AM dose this morning  [facility] open at 6AM tomorrow  UDS pending

## 2022-02-19 NOTE — Assessment & Plan Note (Signed)
States she has been told in the past she has diabetes then doesn't. On no medication A1C pending SSI and accuchecks Qac/hs

## 2022-02-19 NOTE — ED Notes (Signed)
Patient placed on cardiac monitor. Signaling device within reach. PA at bedside.

## 2022-02-19 NOTE — Assessment & Plan Note (Signed)
Lipid panel pending for AM  On on statin therapy, goal LDL <70

## 2022-02-19 NOTE — Consult Note (Addendum)
Stroke Neurology Consultation Note  Consult Requested by: Dr. Rogers Blocker  Reason for Consult: left sided weakness and numbness  Consult Date:  02/19/22  The history was obtained from the patient and chart.  During history and examination, all items were able to obtain unless otherwise noted.  History of Present Illness:  Kaylee Murphy is an 61 y.o. African American female with PMH of DM, HTN, HLD, smoker, depression, anxiety and chronic back pain presents with double vision, vertigo, left-sided weakness.  Per patient, she was driving on Monday had a sudden onset double vision, dizziness.  She put over and his son noticed left facial droop.  She also felt left-sided mild weakness.  They went to [facility] ER, had a CT which was negative.  CTA head and neck showed bilateral ICA siphon atherosclerosis, right more than left, and left V4 segmental stenosis.  MRI reported negative, however, retrospectively early DWI change of pontine infarct.  Patient was DC home from [facility] ER, however symptoms persist although double vision and vertigo became intermittent, comes and goes, left-sided weakness slight improvement over time.  Given persistent symptoms, patient came to ER for reevaluation.  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  Patient was admitted for stroke work-up.  Patient has history of hypertension, diabetes and hyperlipidemia, on BP meds at home.  Not on blood thinners.  Smokes 8 to 10 cigarettes/day.  Denies any alcohol or drug use.  Date last known well: Monday 02/15/22 tPA Given: No: outside window MRS:  0  Past Medical History:  Diagnosis Date   Anemia    Anxiety    Chronic back pain    Chronic shoulder pain 10 years   s/p multiple b/l rotator cuff repair sx - per MRI 04/2010 -  Delamination of the infraspinatus with mild fatty atrophysuggest distal tendon tear with medial propagation. Depending onthe hardware, CT arthrography may add information for evaluation  othe rotator cuff.f    Depression    Diabetes mellitus    Hyperlipidemia    Hypertension      Past Surgical History:  Procedure Laterality Date   BREAST REDUCTION SURGERY  1996   ROTATOR CUFF REPAIR Right 2005   ROTATOR CUFF REPAIR Right 2004   ROTATOR CUFF REPAIR Left 2007   TUBAL LIGATION  1991    Family History  Problem Relation Age of Onset   Cancer Mother    Stroke Father    Colon cancer Neg Hx    Esophageal cancer Neg Hx    Rectal cancer Neg Hx    Stomach cancer Neg Hx      Social History:  reports that she has been smoking cigarettes. She has been smoking an average of .25 packs per day. She has never used smokeless tobacco. She reports that she does not drink alcohol and does not use drugs.  Review of Systems: A full ROS was attempted today and was  able to be performed.  Systems assessed include - Constitutional, Eyes, HENT, Respiratory, Cardiovascular, Gastrointestinal, Genitourinary, Integument/breast, Hematologic/lymphatic, Musculoskeletal, Neurological, Behavioral/Psych, Endocrine, Allergic/Immunologic - with pertinent responses as per HPI.  Allergies:  Allergies  Allergen Reactions   Cephalexin Swelling    REACTION: tongue swells and becomes disoriented   Aspirin Nausea And Vomiting   Codeine Itching   Ketorolac Tromethamine Nausea And Vomiting   Morphine And Related Other (See Comments)    headache     Medications: Prior to Admission: (Not in a hospital admission)   Test  Results: CBC:  Recent Labs  Lab 02/15/22 1629 02/16/22 0302 02/16/22 0318 02/19/22 1010  WBC 13.3*  --   --  11.6*  NEUTROABS  --  8.2*  --  6.9  HGB 11.1*  --  13.3 11.1*  HCT 35.4*  --  39.0 36.5  MCV 87.2  --   --  88.8  PLT 275  --   --  454   Basic Metabolic Panel:  Recent Labs  Lab 02/15/22 1629 02/16/22 0318 02/19/22 1010  NA 140 140 136  K 3.7 3.4* 3.5  CL 105 100 103  CO2 28  --  23  GLUCOSE 100* 159* 187*  BUN '8 6 7  '$ CREATININE 0.62 0.60 0.89  CALCIUM  8.9  --  8.8*   Liver Function Tests: Recent Labs  Lab 02/19/22 1010  AST 16  ALT 12  ALKPHOS 79  BILITOT 0.3  PROT 7.1  ALBUMIN 3.4*   No results for input(s): "LIPASE", "AMYLASE" in the last 168 hours. No results for input(s): "AMMONIA" in the last 168 hours. Coagulation Studies:  Recent Labs    02/19/22 1435  LABPROT 14.1  INR 1.1   Cardiac Enzymes: No results for input(s): "CKTOTAL", "CKMB", "CKMBINDEX", "TROPONINI" in the last 168 hours. BNP: Invalid input(s): "POCBNP" CBG: No results for input(s): "GLUCAP" in the last 168 hours. Urinalysis:  Recent Labs  Lab 02/16/22 0350 02/19/22 1005  COLORURINE STRAW* YELLOW  LABSPEC 1.006 1.012  PHURINE 7.0 6.0  GLUCOSEU 50* NEGATIVE  HGBUR NEGATIVE NEGATIVE  BILIRUBINUR NEGATIVE NEGATIVE  KETONESUR NEGATIVE NEGATIVE  PROTEINUR NEGATIVE NEGATIVE  NITRITE NEGATIVE NEGATIVE  LEUKOCYTESUR NEGATIVE TRACE*   Microbiology:  Results for orders placed or performed during the hospital encounter of 08/27/12  Urine culture     Status: None   Collection Time: 08/27/12  4:55 PM   Specimen: Urine, Clean Catch  Result Value Ref Range Status   Specimen Description URINE, CLEAN CATCH  Final   Special Requests NONE  Final   Culture  Setup Time 08/27/2012 21:02  Final   Colony Count NO GROWTH  Final   Culture NO GROWTH  Final   Report Status 08/28/2012 FINAL  Final   Lipid Panel:     Component Value Date/Time   CHOL 159 10/20/2011 0938   TRIG 140 10/20/2011 0938   HDL 36 (L) 10/20/2011 0938   CHOLHDL 4.4 10/20/2011 0938   VLDL 28 10/20/2011 0938   LDLCALC 95 10/20/2011 0938   HgbA1c:  Lab Results  Component Value Date   HGBA1C 6.8 (H) 02/19/2022   Urine Drug Screen:     Component Value Date/Time   LABOPIA NONE DETECTED 02/19/2022 1435   COCAINSCRNUR NONE DETECTED 02/19/2022 1435   COCAINSCRNUR NEG 06/22/2010 2104   LABBENZ POSITIVE (A) 02/19/2022 1435   LABBENZ NEG 06/22/2010 2104   AMPHETMU NONE DETECTED 02/19/2022  1435   THCU NONE DETECTED 02/19/2022 1435   LABBARB NONE DETECTED 02/19/2022 1435    Alcohol Level:  Recent Labs  Lab 02/16/22 0302 02/19/22 1435  ETH <10 <10    MR BRAIN WO CONTRAST  Result Date: 02/19/2022 CLINICAL DATA:  Neuro deficit, acute, stroke suspected EXAM: MRI HEAD WITHOUT CONTRAST TECHNIQUE: Multiplanar, multiecho pulse sequences of the brain and surrounding structures were obtained without intravenous contrast. COMPARISON:  02/16/2022 FINDINGS: Brain: New small focus of reduced diffusion at the right pontomedullary junction. There is also some involvement more posterosuperiorly along the floor of the fourth ventricle. No evidence of intracranial hemorrhage.  Ventricles and sulci are stable in size and configuration. Unchanged patchy small foci of T2 hyperintensity in the supratentorial white matter that may reflect minor chronic microvascular ischemic changes. There is no intracranial mass or mass effect. No hydrocephalus or extra-axial collection. Vascular: Major vessel flow voids at the skull base are preserved. Skull and upper cervical spine: Normal marrow signal is preserved. As noted on the prior study, a disc osteophyte complex at C3-C4 may cause significant canal narrowing. Sinuses/Orbits: Paranasal sinuses are aerated. Orbits are unremarkable. Other: Sella is expanded and "empty."  Mastoid air cells are clear. 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 Additional smaller acute infarcts more dorsally and superiorly along the floor of the fourth ventricle. Electronically Signed   By: Macy Mis M.D.   On: 02/19/2022 14:39   CT Head Wo Contrast  Result Date: 02/19/2022 CLINICAL DATA:  Weakness. EXAM: CT HEAD WITHOUT CONTRAST TECHNIQUE: Contiguous axial images were obtained from the base of the skull through the vertex without intravenous contrast. RADIATION DOSE REDUCTION: This exam was performed according to the departmental dose-optimization program  which includes automated exposure control, adjustment of the mA and/or kV according to patient size and/or use of iterative reconstruction technique. COMPARISON:  MRI of the brain on 02/16/2022 FINDINGS: Brain: The brain demonstrates no evidence of hemorrhage, infarction, edema, mass effect, extra-axial fluid collection, hydrocephalus or mass lesion. Vascular: No hyperdense vessel or unexpected calcification. Skull: Normal. Negative for fracture or focal lesion. Sinuses/Orbits: No acute finding. Other: None. IMPRESSION: No acute findings by CT of the head without contrast. Electronically Signed   By: Aletta Edouard M.D.   On: 02/19/2022 10:49   MR BRAIN WO CONTRAST  Result Date: 02/16/2022 CLINICAL DATA:  Neuro deficit, acute, stroke suspected; Diplopia EXAM: MRI HEAD AND ORBITS WITHOUT CONTRAST TECHNIQUE: Multiplanar, multi-echo pulse sequences of the brain and surrounding structures were acquired without intravenous contrast. Multiplanar, multi-echo pulse sequences of the orbits and surrounding structures were acquired including fat saturation techniques, without intravenous contrast administration. COMPARISON:  None Available. FINDINGS: Motion artifact is present MRI HEAD FINDINGS Brain: No acute infarction or hemorrhage. Ventricles and sulci are normal in size and configuration. Patchy small foci of T2 hyperintensity in the supratentorial white matter are nonspecific but may reflect minor microvascular ischemic changes. There is no intracranial mass or mass effect. No hydrocephalus or extra-axial collection. Vascular: Major vessel flow voids at the skull base are preserved. Skull and upper cervical spine: Marrow signal is within normal limits. Disc osteophyte complex at C3-C4 may cause marked canal stenosis. Other: Expanded, "empty" sella, probably incidental. MRI ORBITS FINDINGS Orbits: No proptosis. Globes, extraocular muscles, lacrimal glands, and optic nerve sheath complexes are symmetric and  unremarkable within the limitation noted above. No intraorbital mass. Visualized sinuses: Trace mucosal thickening. Soft tissues: Unremarkable. IMPRESSION: No evidence of recent infarction, hemorrhage, or mass. Probable minor chronic microvascular ischemic changes. Disc osteophyte complex at C3-C4 may cause marked canal stenosis. Electronically Signed   By: Macy Mis M.D.   On: 02/16/2022 11:28   MR ORBITS WO CONTRAST  Result Date: 02/16/2022 CLINICAL DATA:  Neuro deficit, acute, stroke suspected; Diplopia EXAM: MRI HEAD AND ORBITS WITHOUT CONTRAST TECHNIQUE: Multiplanar, multi-echo pulse sequences of the brain and surrounding structures were acquired without intravenous contrast. Multiplanar, multi-echo pulse sequences of the orbits and surrounding structures were acquired including fat saturation techniques, without intravenous contrast administration. COMPARISON:  None Available. FINDINGS: Motion artifact is present MRI HEAD FINDINGS Brain: No acute infarction or hemorrhage.  Ventricles and sulci are normal in size and configuration. Patchy small foci of T2 hyperintensity in the supratentorial white matter are nonspecific but may reflect minor microvascular ischemic changes. There is no intracranial mass or mass effect. No hydrocephalus or extra-axial collection. Vascular: Major vessel flow voids at the skull base are preserved. Skull and upper cervical spine: Marrow signal is within normal limits. Disc osteophyte complex at C3-C4 may cause marked canal stenosis. Other: Expanded, "empty" sella, probably incidental. MRI ORBITS FINDINGS Orbits: No proptosis. Globes, extraocular muscles, lacrimal glands, and optic nerve sheath complexes are symmetric and unremarkable within the limitation noted above. No intraorbital mass. Visualized sinuses: Trace mucosal thickening. Soft tissues: Unremarkable. IMPRESSION: No evidence of recent infarction, hemorrhage, or mass. Probable minor chronic microvascular ischemic  changes. Disc osteophyte complex at C3-C4 may cause marked canal stenosis. Electronically Signed   By: Macy Mis M.D.   On: 02/16/2022 11:28   CT ANGIO HEAD NECK W WO CM  Result Date: 02/16/2022 CLINICAL DATA:  Dizziness and blurred vision beginning while driving. EXAM: CT ANGIOGRAPHY HEAD AND NECK TECHNIQUE: Multidetector CT imaging of the head and neck was performed using the standard protocol during bolus administration of intravenous contrast. Multiplanar CT image reconstructions and MIPs were obtained to evaluate the vascular anatomy. Carotid stenosis measurements (when applicable) are obtained utilizing NASCET criteria, using the distal internal carotid diameter as the denominator. RADIATION DOSE REDUCTION: This exam was performed according to the departmental dose-optimization program which includes automated exposure control, adjustment of the mA and/or kV according to patient size and/or use of iterative reconstruction technique. CONTRAST:  169m OMNIPAQUE IOHEXOL 350 MG/ML SOLN COMPARISON:  11/29/2017 head CT FINDINGS: CT HEAD FINDINGS Brain: No evidence of acute infarction, hemorrhage, hydrocephalus, extra-axial collection or mass lesion/mass effect. Partially empty sella, nonspecific in isolation and stable since 2019. Vascular: See below Skull: Negative Sinuses: Negative Orbits: Negative Review of the MIP images confirms the above findings CTA NECK FINDINGS Aortic arch: Unremarkable Right carotid system: Mild low-density plaque at the bifurcation/bulb without stenosis or ulceration. ICA tortuosity. Left carotid system: Low-density plaque at the bifurcation and ICA bulb. Mild ICA tortuosity. No stenosis or ulceration. Vertebral arteries: No proximal subclavian stenosis. The left vertebral artery is dominant. No vertebral stenosis or beading. Skeleton: Generalized cervical spine degeneration. Other neck: No acute or aggressive finding. Midline nasopharyngeal cyst. Upper chest: Centrilobular  emphysema. Review of the MIP images confirms the above findings CTA HEAD FINDINGS Anterior circulation: At least 50% stenosis of the atheromatous right cavernous ICA. No branch occlusion, beading, or aneurysm. Aplastic right A1 segment. Posterior circulation: Left dominant vertebral artery. Mild atheromatous plaque at the left V4 segment causing 40% stenosis. No branch occlusion, beading, or aneurysm. Venous sinuses: Diffusely patent Anatomic variants: As above Review of the MIP images confirms the above findings IMPRESSION: 1. No emergent finding. 2. 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 3.  Emphysema (ICD10-J43.9). Electronically Signed   By: JJorje GuildM.D.   On: 02/16/2022 04:16     EKG: normal EKG, normal sinus rhythm, unchanged from previous tracings.   Physical Examination: Temp:  [98.9 F (37.2 C)] 98.9 F (37.2 C) (06/09 1003) Pulse Rate:  [42-58] 42 (06/09 1600) Resp:  [11-20] 20 (06/09 1600) BP: (143-186)/(72-94) 177/90 (06/09 1600) SpO2:  [91 %-100 %] 94 % (06/09 1600)  General - Well nourished, well developed, in no apparent distress.  Ophthalmologic - fundi not visualized due to noncooperation.  Cardiovascular - Regular  rate and rhythm.  Mental Status -  Level of arousal and orientation to time, place, and person were intact. Language including expression, repetition, comprehension was assessed and found intact.  Cranial Nerves II - XII - II - Visual field intact OU. III, IV, VI - right abduction not complete V - Facial sensation diminished on the left. VII - left facial droop. VIII - Hearing & vestibular intact bilaterally. XI - Chin turning & shoulder shrug intact bilaterally. XII - Tongue protrusion intact.  Motor Strength - The patient's strength was 5/5 in RUE ane RLE, 4/5 proximal and 3+/5 distal in LUE and 3-/5 proximal and 4/5 distal in LLE.  Bulk was normal and fasciculations were absent.   Motor  Tone - Muscle tone was assessed at the neck and appendages and was normal.  Reflexes - The patient's reflexes were symmetrical in all extremities and she had no pathological reflexes.  Sensory - sensation symmetrical bilaterally   Coordination - The patient had normal movements in the right hands and foot with no ataxia or dysmetria. Left FTN very slow but no ataxia. Left HTS hard to complete. Tremor was absent.  Gait and Station - deferred.   Assessment:  Ms. Kaylee Murphy is a 61 y.o. female with history of HTN, HLD, DM, anxiety, smoker, depression and chronic back pain presenting with diplopia, vertigo and left sided weakness.  Time onset Monday 02/15/22.  Initial CT 6/6 was negative.  CTA head and neck showed bilateral ICA siphon atherosclerosis, right more than left, and left V4 segmental stenosis.  MRI reported negative, however, retrospectively early DWI change of pontine infarct. However, symptoms persist although with mild improvement, came to ER today, CT and CTA head and neck unchanged.  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   Recommendation Continue further stroke work up  Frequent neuro checks Telemetry monitoring 2D echo UDS, fasting lipid panel and HgbA1C PT/OT consult Put on aspirin, Plavix and Crestor.   Stroke risk factor modification We will follow   Hospital day # 0   Thank you for this consultation and allowing [place] to participate in the care of this patient.  Rosalin Hawking, MD PhD Stroke Neurology 02/19/2022 7:38 PM   To contact Stroke Continuity provider, please refer to [email or web address] After hours, contact General Neurology

## 2022-02-19 NOTE — Assessment & Plan Note (Signed)
Likely reactive. No signs or symptoms of infection, no fever Trend and follow fever curve.

## 2022-02-19 NOTE — ED Provider Triage Note (Signed)
Emergency Medicine Provider Triage Evaluation Note  Kaylee Murphy , a 61 y.o. female  was evaluated in triage.  Pt complains of left-sided weakness onset since Monday.  Patient reports that she was seen at [facility] long on Monday where they did a CT and MRI that did not show any signs of stroke.  She reports that since then she has had progressive weakness with the left side of her body.  She reports a facial droop.  She denies any double vision that she was having on Monday.  Review of Systems  Positive:  Negative:   Physical Exam  LMP 02/13/2014  Gen:   Awake, no distress   Resp:  Normal effort  MSK:   Moves extremities without difficulty  Other:  Patient not cooperative with exam.  Cranial nerves maturely intact, she does have a slight facial droop in the left.  PERRLA.  No pronator drift per say, but the patient can't raise her left arm as high as the right, but it is steady.   Medical Decision Making  Medically screening exam initiated at 10:02 AM.  Appropriate orders placed.  Kaylee Murphy was informed that the remainder of the evaluation will be completed by another provider, this initial triage assessment does not replace that evaluation, and the importance of remaining in the ED until their evaluation is complete.  Patient was complaining of left-sided weakness today, however she was cleaning of right-sided weakness on Monday.  She was elevating of diplopia which she does not have now.  Even though the patient had CT imaging and MRIs that she has new deficits on the left, will repeat CT as well as some basic lab work.   Kaylee Puller, PA-C 02/19/22 1004

## 2022-02-19 NOTE — Assessment & Plan Note (Signed)
Nicotine patch Encouraged cessation/counseling to help reduce risk of another CVA

## 2022-02-19 NOTE — ED Notes (Signed)
Patient transported to MRI 

## 2022-02-19 NOTE — H&P (Signed)
History and Physical    Patient: Kaylee Murphy OEU:[id number] DOB: 04/16/1961 DOA: 02/19/2022 DOS: the patient was seen and examined on 02/19/2022 PCP: Pcp, No  Patient coming from: Home - [place] her [place] and [place]. Does not use walker or cane.    Chief Complaint: diplopia and left sided weakness  HPI: Kaylee Murphy is a 61 y.o. female with medical history significant of T2DM, HTN, HLD, depression and anxiety, chronic shoulder pain who presented to Ed with complaints of  diplopia and left sided weakness that started on Monday this week. Was seen at [facility] and had a negative brain MRI and CTA head/neck. Over the past several days she has had progressive left sided weakness, dizziness and her mouth had a right droop per her son that prompted her to come back to ER. Vision is intermittent blurry, but no more double vision. She is right handed.    Denies any fever/chills, headaches, chest pain or palpitations, shortness of breath or cough, abdominal pain, N/V/D, dysuria or leg swelling.   She smokes 1/2 ppd and does not drink alcohol. She has a family hx of CVA in her father.   Methadone '80mg'$  daily in AM   ER Course:  vitals: afebrile, bp: 170/83, HR: 58, RR: 17, oxygen 100%RA Pertinent labs: wbc: 11.6, hgb: 11.1,  Ct head: no acute findings Brain MRI: Acute small infarct at the right pontomedullary junction. Additional smaller acute infarcts more dorsally and superiorly along the floor of the fourth ventricle. In ED: neurology consulted.    Review of Systems: As mentioned in the history of present illness. All other systems reviewed and are negative. Past Medical History:  Diagnosis Date   Anemia    Anxiety    Chronic back pain    Chronic shoulder pain 10 years   s/p multiple b/l rotator cuff repair sx - per MRI 04/2010 -  Delamination of the infraspinatus with mild fatty atrophysuggest distal tendon tear with medial propagation. Depending onthe hardware, CT arthrography  may add information for evaluation othe rotator cuff.f    Depression    Diabetes mellitus    Hyperlipidemia    Hypertension    Past Surgical History:  Procedure Laterality Date   BREAST REDUCTION SURGERY  1996   ROTATOR CUFF REPAIR Right 2005   ROTATOR CUFF REPAIR Right 2004   ROTATOR CUFF REPAIR Left 2007   TUBAL LIGATION  1991   Social History:  reports that she has been smoking cigarettes. She has been smoking an average of .25 packs per day. She has never used smokeless tobacco. She reports that she does not drink alcohol and does not use drugs.  Allergies  Allergen Reactions   Cephalexin Swelling    REACTION: tongue swells and becomes disoriented   Aspirin Nausea And Vomiting   Codeine Itching   Ketorolac Tromethamine Nausea And Vomiting   Morphine And Related Other (See Comments)    headache    Family History  Problem Relation Age of Onset   Cancer Mother    Stroke Father    Colon cancer Neg Hx    Esophageal cancer Neg Hx    Rectal cancer Neg Hx    Stomach cancer Neg Hx     Prior to Admission medications   Medication Sig Start Date End Date Taking? Authorizing Provider  acetaminophen (TYLENOL) 325 MG tablet Take 650 mg by mouth 2 (two) times daily as needed (pain).    [provider]  atenolol (TENORMIN) 50 MG tablet  Take 50 mg by mouth every morning. 01/11/22   [provider]  gabapentin (NEURONTIN) 400 MG capsule Take 400 mg by mouth See admin instructions. Take one capsule (400 mg) by mouth every morning, take one capsule (400 mg) at night as needed for nerve pain 01/11/22   [provider]  ibuprofen (ADVIL) 200 MG tablet Take 400 mg by mouth 2 (two) times daily as needed (pain).    [provider]  METHADONE HCL PO Take 80 mg by mouth every morning.    [provider]    Physical Exam: Vitals:   02/19/22 1215 02/19/22 1245 02/19/22 1300 02/19/22 1315  BP: (!) 146/90 (!) 158/94 (!) 173/84 (!) 178/81  Pulse: (!) 48  (!) 45 (!) 44 (!) 47  Resp: '17 14 11 13  '$ Temp:      TempSrc:      SpO2: 99% 98% 93% 99%   General:  Appears calm and comfortable and is in NAD Eyes:  PERRL, EOMI, normal lids, iris ENT:  grossly normal hearing, lips & tongue, mmm; appropriate dentition Neck:  no LAD, masses or thyromegaly; no carotid bruits Cardiovascular:  bradycardic, regular rhythm  no m/r/g. No LE edema.  Respiratory:   CTA bilaterally with no wheezes/rales/rhonchi.  Normal respiratory effort. Abdomen:  soft, NT, ND, NABS Back:   normal alignment, no CVAT Skin:  no rash or induration seen on limited exam Musculoskeletal:  grossly normal tone RUE/RLE. LUE: 3/5 and LLE: 1/5. Can not raise against gravity.  good ROM on right.  no bony abnormality Lower extremity:  No LE edema.  Limited foot exam with no ulcerations.  2+ distal pulses. Psychiatric:  grossly normal mood and affect, speech fluent and appropriate, AOx3 Neurologic:  CN 2-12 grossly intact. CN VI: slightly left facial droop moves all extremities in coordinated fashion, sensation intact. HTK intact on right, can not do on the left. FTN intact on right, can not perform left. Peripheral vision intact. Gait deferred.    Radiological Exams on Admission: Independently reviewed - see discussion in A/P where applicable  MR BRAIN WO CONTRAST  Result Date: 02/19/2022 CLINICAL DATA:  Neuro deficit, acute, stroke suspected EXAM: MRI HEAD WITHOUT CONTRAST TECHNIQUE: Multiplanar, multiecho pulse sequences of the brain and surrounding structures were obtained without intravenous contrast. COMPARISON:  02/16/2022 FINDINGS: Brain: New small focus of reduced diffusion at the right pontomedullary junction. There is also some involvement more posterosuperiorly along the floor of the fourth ventricle. No evidence of intracranial hemorrhage. Ventricles and sulci are stable in size and configuration. Unchanged patchy small foci of T2 hyperintensity in the supratentorial white matter  that may reflect minor chronic microvascular ischemic changes. There is no intracranial mass or mass effect. No hydrocephalus or extra-axial collection. Vascular: Major vessel flow voids at the skull base are preserved. Skull and upper cervical spine: Normal marrow signal is preserved. As noted on the prior study, a disc osteophyte complex at C3-C4 may cause significant canal narrowing. Sinuses/Orbits: Paranasal sinuses are aerated. Orbits are unremarkable. Other: Sella is expanded and "empty."  Mastoid air cells are clear. 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 Additional smaller acute infarcts more dorsally and superiorly along the floor of the fourth ventricle. Electronically Signed   By: Macy Mis M.D.   On: 02/19/2022 14:39   CT Head Wo Contrast  Result Date: 02/19/2022 CLINICAL DATA:  Weakness. EXAM: CT HEAD WITHOUT CONTRAST TECHNIQUE: Contiguous axial images were obtained from the base of the skull through  the vertex without intravenous contrast. RADIATION DOSE REDUCTION: This exam was performed according to the departmental dose-optimization program which includes automated exposure control, adjustment of the mA and/or kV according to patient size and/or use of iterative reconstruction technique. COMPARISON:  MRI of the brain on 02/16/2022 FINDINGS: Brain: The brain demonstrates no evidence of hemorrhage, infarction, edema, mass effect, extra-axial fluid collection, hydrocephalus or mass lesion. Vascular: No hyperdense vessel or unexpected calcification. Skull: Normal. Negative for fracture or focal lesion. Sinuses/Orbits: No acute finding. Other: None. IMPRESSION: No acute findings by CT of the head without contrast. Electronically Signed   By: Aletta Edouard M.D.   On: 02/19/2022 10:49    EKG: Independently reviewed.  NSR with rate 60; nonspecific ST changes with no evidence of acute ischemia   Labs on Admission: I have personally reviewed the available labs and  imaging studies at the time of the admission.  Pertinent labs:   wbc: 11.6, hgb: 11.1,  Assessment and Plan: Principal Problem:   Acute CVA (cerebrovascular accident) ([place]) Active Problems:   Essential hypertension, benign   Hyperlipidemia   Leukocytosis   Controlled type 2 diabetes mellitus without complication, without long-term current use of insulin ([facility])   Chronic shoulder pain   Iron deficiency anemia   Tobacco abuse    Assessment and Plan: * Acute CVA (cerebrovascular accident) ([place]) 61 year old female with 5 day history of vision changes, dizziness and progressive left sided weakness found to have acute infarct at the right pontomedullary junction in addition to smaller acute infarcts more dorsally and superiorly along the floor of the 4th ventricle.  -place in observation on telemetry for stroke work-up -Neurochecks per protocol -Neurology consulted -repeat CTA head/neck with new acute stroke findings  -echo -A1C/lipid panel  -start ASA '81mg'$ . Has an allergy she tells me years ago gave her nausea, but she is willing to try again. plavix per neurology  -Permissive hypertension first 24 hours <220/110 -N.p.o. until bedside swallow screen -PT/ OT/ SLP consult   Essential hypertension, benign Allow for permissive HTN in first 24-48 hours hyrdralazine prn with parameters ordered   Leukocytosis Likely reactive. No signs or symptoms of infection, no fever Trend and follow fever curve.   Hyperlipidemia Lipid panel pending for AM  On on statin therapy, goal LDL <70  Controlled type 2 diabetes mellitus without complication, without long-term current use of insulin ([place]) States she has been told in the past she has diabetes then doesn't. On no medication A1C pending SSI and accuchecks Qac/hs   Chronic shoulder pain On methadone '80mg'$  daily. Can not verify this until tomorrow. Had her AM dose this morning  [facility] open at 6AM tomorrow  UDS pending   Iron  deficiency anemia Do not have recent labs, but appears to be around baseline from years ago Continue to monitor   Tobacco abuse Nicotine patch Encouraged cessation/counseling to help reduce risk of another CVA      Advance Care Planning:   Code Status: Full Code   Consults: neurology: Dr. Leonel Ramsay   DVT Prophylaxis: lovenox   Family Communication: none   Severity of Illness: The appropriate patient status for this patient is OBSERVATION. Observation status is judged to be reasonable and necessary in order to provide the required intensity of service to ensure the patient's safety. The patient's presenting symptoms, physical exam findings, and initial radiographic and laboratory data in the context of their medical condition is felt to place them at decreased risk for further clinical deterioration. Furthermore,  it is anticipated that the patient will be medically stable for discharge from the hospital within 2 midnights of admission.   Author: Orma Flaming, MD 02/19/2022 4:05 PM  For on call review [email or web address].

## 2022-02-20 ENCOUNTER — Inpatient Hospital Stay (HOSPITAL_COMMUNITY): Payer: Medicare Other

## 2022-02-20 DIAGNOSIS — Z79891 Long term (current) use of opiate analgesic: Secondary | ICD-10-CM | POA: Diagnosis not present

## 2022-02-20 DIAGNOSIS — Z823 Family history of stroke: Secondary | ICD-10-CM | POA: Diagnosis not present

## 2022-02-20 DIAGNOSIS — E119 Type 2 diabetes mellitus without complications: Secondary | ICD-10-CM | POA: Diagnosis present

## 2022-02-20 DIAGNOSIS — I6389 Other cerebral infarction: Secondary | ICD-10-CM

## 2022-02-20 DIAGNOSIS — F32A Depression, unspecified: Secondary | ICD-10-CM | POA: Diagnosis present

## 2022-02-20 DIAGNOSIS — I6381 Other cerebral infarction due to occlusion or stenosis of small artery: Secondary | ICD-10-CM | POA: Diagnosis present

## 2022-02-20 DIAGNOSIS — M25519 Pain in unspecified shoulder: Secondary | ICD-10-CM | POA: Diagnosis not present

## 2022-02-20 DIAGNOSIS — G8194 Hemiplegia, unspecified affecting left nondominant side: Secondary | ICD-10-CM | POA: Diagnosis present

## 2022-02-20 DIAGNOSIS — D509 Iron deficiency anemia, unspecified: Secondary | ICD-10-CM | POA: Diagnosis present

## 2022-02-20 DIAGNOSIS — R29708 NIHSS score 8: Secondary | ICD-10-CM | POA: Diagnosis not present

## 2022-02-20 DIAGNOSIS — Z20822 Contact with and (suspected) exposure to covid-19: Secondary | ICD-10-CM | POA: Diagnosis present

## 2022-02-20 DIAGNOSIS — I110 Hypertensive heart disease with heart failure: Secondary | ICD-10-CM | POA: Diagnosis present

## 2022-02-20 DIAGNOSIS — I672 Cerebral atherosclerosis: Secondary | ICD-10-CM | POA: Diagnosis present

## 2022-02-20 DIAGNOSIS — G894 Chronic pain syndrome: Secondary | ICD-10-CM | POA: Diagnosis present

## 2022-02-20 DIAGNOSIS — R29810 Facial weakness: Secondary | ICD-10-CM | POA: Diagnosis present

## 2022-02-20 DIAGNOSIS — E785 Hyperlipidemia, unspecified: Secondary | ICD-10-CM | POA: Diagnosis not present

## 2022-02-20 DIAGNOSIS — Z888 Allergy status to other drugs, medicaments and biological substances status: Secondary | ICD-10-CM | POA: Diagnosis not present

## 2022-02-20 DIAGNOSIS — I10 Essential (primary) hypertension: Secondary | ICD-10-CM

## 2022-02-20 DIAGNOSIS — I639 Cerebral infarction, unspecified: Secondary | ICD-10-CM | POA: Diagnosis present

## 2022-02-20 DIAGNOSIS — I5022 Chronic systolic (congestive) heart failure: Secondary | ICD-10-CM | POA: Diagnosis present

## 2022-02-20 DIAGNOSIS — R54 Age-related physical debility: Secondary | ICD-10-CM | POA: Diagnosis present

## 2022-02-20 DIAGNOSIS — F17210 Nicotine dependence, cigarettes, uncomplicated: Secondary | ICD-10-CM | POA: Diagnosis present

## 2022-02-20 DIAGNOSIS — Z885 Allergy status to narcotic agent status: Secondary | ICD-10-CM | POA: Diagnosis not present

## 2022-02-20 DIAGNOSIS — Z886 Allergy status to analgesic agent status: Secondary | ICD-10-CM | POA: Diagnosis not present

## 2022-02-20 DIAGNOSIS — G8929 Other chronic pain: Secondary | ICD-10-CM

## 2022-02-20 DIAGNOSIS — H532 Diplopia: Secondary | ICD-10-CM | POA: Diagnosis present

## 2022-02-20 DIAGNOSIS — D72829 Elevated white blood cell count, unspecified: Secondary | ICD-10-CM | POA: Diagnosis present

## 2022-02-20 DIAGNOSIS — R29705 NIHSS score 5: Secondary | ICD-10-CM | POA: Diagnosis present

## 2022-02-20 DIAGNOSIS — M549 Dorsalgia, unspecified: Secondary | ICD-10-CM | POA: Diagnosis present

## 2022-02-20 LAB — GLUCOSE, CAPILLARY
Glucose-Capillary: 176 mg/dL — ABNORMAL HIGH (ref 70–99)
Glucose-Capillary: 105 mg/dL — ABNORMAL HIGH (ref 70–99)
Glucose-Capillary: 134 mg/dL — ABNORMAL HIGH (ref 70–99)
Glucose-Capillary: 123 mg/dL — ABNORMAL HIGH (ref 70–99)

## 2022-02-20 LAB — ECHOCARDIOGRAM COMPLETE
AR max vel: 2 cm2
AV Area VTI: 1.83 cm2
AV Area mean vel: 1.88 cm2
AV Mean grad: 5 mmHg
AV Peak grad: 8.6 mmHg
Ao pk vel: 1.47 m/s
Area-P 1/2: 1.52 cm2
Calc EF: 48.3 %
S' Lateral: 3.9 cm
Single Plane A2C EF: 52.1 %
Single Plane A4C EF: 44.7 %

## 2022-02-20 LAB — HIV ANTIBODY (ROUTINE TESTING W REFLEX): HIV Screen 4th Generation wRfx: NONREACTIVE

## 2022-02-20 LAB — LIPID PANEL
Cholesterol: 174 mg/dL (ref 0–200)
HDL: 30 mg/dL — ABNORMAL LOW (ref >40)
LDL Cholesterol: 103 mg/dL — ABNORMAL HIGH (ref 0–99)
Total CHOL/HDL Ratio: 5.8 RATIO
Triglycerides: 205 mg/dL — ABNORMAL HIGH (ref <150)
VLDL: 41 mg/dL — ABNORMAL HIGH (ref 0–40)

## 2022-02-20 MED ORDER — SODIUM CHLORIDE 0.9 % IV SOLN: INTRAVENOUS | Status: DC | PRN

## 2022-02-20 MED ORDER — METHADONE HCL 10 MG/ML PO CONC
80.0000 mg | Freq: Every morning | ORAL | Status: DC
  Administered 2022-02-20 – 2022-02-22 (×3): 80 mg via ORAL
  Filled 2022-02-20 (×3): qty 10

## 2022-02-20 NOTE — Evaluation (Signed)
Speech Language Pathology Evaluation Patient Details Name: Kaylee Murphy MRN: [id number] DOB: 12-26-1960 Today's Date: 02/20/2022 Time: 0160-1093 SLP Time Calculation (min) (ACUTE ONLY): 21 min  Problem List:  Patient Active Problem List   Diagnosis Date Noted   Acute CVA (cerebrovascular accident) ([place]) 02/19/2022   Chronic shoulder pain 02/19/2022   Controlled type 2 diabetes mellitus without complication, without long-term current use of insulin ([place]) 02/19/2022   Leukocytosis 02/19/2022   Tobacco abuse 02/19/2022   Chest pain 10/23/2011   Iron deficiency anemia 10/23/2011   Anxiety 10/20/2011   Preventative health care 10/20/2011   Hyperlipidemia 02/13/2010   DEPRESSION, MAJOR 07/24/2009   DM 06/11/2009   Essential hypertension, benign 06/11/2009   ROTATOR CUFF REPAIR, HX OF 06/11/2009   Past Medical History:  Past Medical History:  Diagnosis Date   Anemia    Anxiety    Chronic back pain    Chronic shoulder pain 10 years   s/p multiple b/l rotator cuff repair sx - per MRI 04/2010 -  Delamination of the infraspinatus with mild fatty atrophysuggest distal tendon tear with medial propagation. Depending onthe hardware, CT arthrography may add information for evaluation othe rotator cuff.f    Depression    Diabetes mellitus    Hyperlipidemia    Hypertension    Past Surgical History:  Past Surgical History:  Procedure Laterality Date   BREAST REDUCTION SURGERY  1996   ROTATOR CUFF REPAIR Right 2005   ROTATOR CUFF REPAIR Right 2004   ROTATOR CUFF REPAIR Left 2007   TUBAL LIGATION  1991   HPI:  61 y.o. female  who presented to ED with complaints of diplopia and left sided weakness that started on 02/15/22. Se[facility] at [facility] with negative brain MRI and CTA head/neck. Weakness worsening and returned to ED; +acute infarct at the right pontomedullary junction;   PMH significant of T2DM, HTN, HLD, depression and anxiety, chronic shoulder pain   Assessment / Plan /  Recommendation Clinical Impression  Pt presents with a mild cognitive impairment post CVA. [employer] Mental Status administered (SLUMS: 20/30) with deficits in delayed recall, mental manipulation, and executive function skills. Pt oriented x4 and states she notes a mild change in thinking skills since admission. Receptive and expressive language skills and motor speech skills appeared intact. PLOF pt was independent with all ADLs. Will follow up for higher level cognitive tasks to maximize safety and independence.    SLP Assessment  SLP Recommendation/Assessment: Patient needs continued [facility] SLP Visit Diagnosis: Cognitive communication deficit (R41.841)    Recommendations for follow up therapy are one component of a multi-disciplinary discharge planning process, led by the attending physician.  Recommendations may be updated based on patient status, additional functional criteria and insurance authorization.    Follow Up Recommendations  Outpatient SLP    Assistance Recommended at Discharge  None  Functional Status Assessment Patient has had a recent decline in their functional status and demonstrates the ability to make significant improvements in function in a reasonable and predictable amount of time.  Frequency and Duration min 1 x/week  1 week      SLP Evaluation Cognition  Overall Cognitive Status: Impaired/Different from baseline Arousal/Alertness: Awake/alert Orientation Level: Oriented X4 Attention: Alternating;Divided;Focused Focused Attention: Appears intact Alternating Attention: Impaired Alternating Attention Impairment: Verbal complex;Functional complex Divided Attention: Impaired Divided Attention Impairment: Verbal complex;Functional complex Memory Impairment: Decreased recall of new information;Decreased short term memory Awareness: Appears intact Problem Solving: Impaired Problem Solving Impairment: Verbal  complex;Functional complex  Executive Function: Press photographer: Impaired Self Monitoring: Impaired Safety/Judgment: Appears intact       Comprehension  Auditory Comprehension Overall Auditory Comprehension: Appears within functional limits for tasks assessed    Expression Expression Primary Mode of Expression: Verbal Written Expression Dominant Hand: Right   Oral / Motor  Oral Motor/Sensory Function Overall Oral Motor/Sensory Function: Mild impairment Facial ROM: Within Functional Limits Facial Symmetry: Abnormal symmetry left (trace left facial droop) Motor Speech Overall Motor Speech: Appears within functional limits for tasks assessed            Hayden Rasmussen MA, CCC-SLP Acute Rehabilitation Services   02/20/2022, 3:01 PM

## 2022-02-20 NOTE — Progress Notes (Signed)
  Echocardiogram 2D Echocardiogram has been performed.  Kaylee Murphy 02/20/2022, 2:15 PM

## 2022-02-20 NOTE — Progress Notes (Addendum)
STROKE TEAM PROGRESS NOTE   INTERVAL HISTORY Patient is seen in her room with no family at the bedside.  She reports that she originally experienced an episode of diplopia on 6/5 and was seen in the ED at [facility].  Her MRI was reportedly negative but on further review appears to show questionable small pontine infarct.  Diplopia resolved, but patient developed left sided weakness and presented to the ED again.  Repeat MRI revealed right pontine infarct.  Her symptoms have slightly improved.  CT angiograms have not shown any significant large vessel stenosis or occlusion.  Echocardiogram is pending.  Hemoglobin A1c 6.8.  Urine drug screen is negative  Vitals:   02/20/22 0400 02/20/22 0600 02/20/22 0741 02/20/22 1158  BP: (!) 157/86 136/79 (!) 159/78 (!) 144/98  Pulse: (!) 47 (!) 46 (!) 36 (!) 46  Resp: '17 16 19 16  '$ Temp: 98.4 F (36.9 C)  97.9 F (36.6 C) 98.1 F (36.7 C)  TempSrc:   Oral Oral  SpO2: 96% 98% 98%    CBC:  Recent Labs  Lab 02/15/22 1629 02/16/22 0302 02/16/22 0318 02/19/22 1010  WBC 13.3*  --   --  11.6*  NEUTROABS  --  8.2*  --  6.9  HGB 11.1*  --  13.3 11.1*  HCT 35.4*  --  39.0 36.5  MCV 87.2  --   --  88.8  PLT 275  --   --  409   Basic Metabolic Panel:  Recent Labs  Lab 02/15/22 1629 02/16/22 0318 02/19/22 1010  NA 140 140 136  K 3.7 3.4* 3.5  CL 105 100 103  CO2 28  --  23  GLUCOSE 100* 159* 187*  BUN '8 6 7  '$ CREATININE 0.62 0.60 0.89  CALCIUM 8.9  --  8.8*   Lipid Panel:  Recent Labs  Lab 02/20/22 0933  CHOL 174  TRIG 205*  HDL 30*  CHOLHDL 5.8  VLDL 41*  LDLCALC 103*   HgbA1c:  Recent Labs  Lab 02/19/22 1530  HGBA1C 6.8*   Urine Drug Screen:  Recent Labs  Lab 02/19/22 1435  LABOPIA NONE DETECTED  COCAINSCRNUR NONE DETECTED  LABBENZ POSITIVE*  AMPHETMU NONE DETECTED  THCU NONE DETECTED  LABBARB NONE DETECTED    Alcohol Level  Recent Labs  Lab 02/19/22 1435  ETH <10    IMAGING past 24 hours CT ANGIO HEAD NECK W  WO CM  Result Date: 02/19/2022 CLINICAL DATA:  Generalized weakness, stroke-like symptoms; acute infarcts on same-day MRI EXAM: CT ANGIOGRAPHY HEAD AND NECK TECHNIQUE: Multidetector CT imaging of the head and neck was performed using the standard protocol during bolus administration of intravenous contrast. Multiplanar CT image reconstructions and MIPs were obtained to evaluate the vascular anatomy. Carotid stenosis measurements (when applicable) are obtained utilizing NASCET criteria, using the distal internal carotid diameter as the denominator. RADIATION DOSE REDUCTION: This exam was performed according to the departmental dose-optimization program which includes automated exposure control, adjustment of the mA and/or kV according to patient size and/or use of iterative reconstruction technique. CONTRAST:  174m OMNIPAQUE IOHEXOL 350 MG/ML SOLN COMPARISON:  02/16/2022 FINDINGS: CT HEAD FINDINGS For noncontrast findings, please see same day CT head. CTA NECK FINDINGS Aortic arch: Standard branching. Imaged portion shows no evidence of aneurysm or dissection. No significant stenosis of the major arch vessel origins. Right carotid system: No evidence of dissection, occlusion, or hemodynamically significant stenosis (greater than 50%). Left carotid system: No evidence of dissection, occlusion, or hemodynamically  significant stenosis (greater than 50%). Vertebral arteries: No evidence of dissection, occlusion, or hemodynamically significant stenosis (greater than 50%). Skeleton: Degenerative changes in the cervical spine. Edentulous. No acute osseous abnormality. Other neck: No acute finding. Upper chest: Centrilobular and paraseptal emphysema. No focal pulmonary opacity or pleural effusion. Review of the MIP images confirms the above findings CTA HEAD FINDINGS Anterior circulation: Both internal carotid arteries are patent to the termini, with calcified and noncalcified plaque in the right cavernous carotid causing  at least 50% stenosis similar to the prior exam. Hypoplastic or aplastic right A1. Patent left A1. Normal anterior communicating artery. Anterior cerebral arteries are patent to their distal aspects. No M1 stenosis or occlusion. Normal MCA bifurcations. Distal MCA branches perfused and symmetric. Posterior circulation: Vertebral arteries patent to the vertebrobasilar junction, with redemonstrated mild-to-moderate focal stenosis in the left V4 segment. Left dominant system. Posterior inferior cerebellar arteries patent proximally. Basilar patent to its distal aspect. Superior cerebellar arteries patent proximally. Patent P1 segments. PCAs perfused to their distal aspects without stenosis. The bilateral posterior communicating arteries are not visualized. Venous sinuses: As permitted by contrast timing, patent. Anatomic variants: None significant. Review of the MIP images confirms the above findings IMPRESSION: 1. No intracranial large vessel occlusion or new stenosis. Unchanged right cavernous carotid and left V4 segment stenosis. 2.  No hemodynamically significant stenosis in the neck. 3.  Emphysema (ICD10-J43.9). Electronically Signed   By: Merilyn Baba M.D.   On: 02/19/2022 18:53   MR BRAIN WO CONTRAST  Result Date: 02/19/2022 CLINICAL DATA:  Neuro deficit, acute, stroke suspected EXAM: MRI HEAD WITHOUT CONTRAST TECHNIQUE: Multiplanar, multiecho pulse sequences of the brain and surrounding structures were obtained without intravenous contrast. COMPARISON:  02/16/2022 FINDINGS: Brain: New small focus of reduced diffusion at the right pontomedullary junction. There is also some involvement more posterosuperiorly along the floor of the fourth ventricle. No evidence of intracranial hemorrhage. Ventricles and sulci are stable in size and configuration. Unchanged patchy small foci of T2 hyperintensity in the supratentorial white matter that may reflect minor chronic microvascular ischemic changes. There is no  intracranial mass or mass effect. No hydrocephalus or extra-axial collection. Vascular: Major vessel flow voids at the skull base are preserved. Skull and upper cervical spine: Normal marrow signal is preserved. As noted on the prior study, a disc osteophyte complex at C3-C4 may cause significant canal narrowing. Sinuses/Orbits: Paranasal sinuses are aerated. Orbits are unremarkable. Other: Sella is expanded and "empty."  Mastoid air cells are clear. 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 Additional smaller acute infarcts more dorsally and superiorly along the floor of the fourth ventricle. Electronically Signed   By: Macy Mis M.D.   On: 02/19/2022 14:39    PHYSICAL EXAM General:  Alert, frail middle-aged African-American lady in no acute distress Respiratory:  Regular, unlabored respirations on room air  NEURO:  Mental Status: AA&Ox3  Speech/Language: speech is without dysarthria or aphasia.  Fluency, and comprehension intact.  Cranial Nerves:  II: PERRL. Visual fields full.  III, IV, VI: EOMI. Eyelids elevate symmetrically.  V: Sensation is intact to light touch and symmetrical to face.  VII: Slight right facial droop  VIII: hearing intact to voice. IX, X: Phonation is normal.  XII: tongue is midline without fasciculations. Motor: 5/5 strength to RUE, RLE and LLE, 4+/5 to LUE.  Diminished fine finger movements on the left.  Orbits right over left upper extremity.  Mild left grip weakness. Tone: is normal and bulk is normal  Sensation- Diminished on left side Coordination: FTN intact bilaterally, HKS: no ataxia in BLE. Gait- deferred   ASSESSMENT/PLAN Kaylee Murphy is a 61 y.o. female with history of DM, HTN, HLD, smoking, depression, anxiety and chronic back pain presenting with an episode of diplopia on 6/5 for which she was seen in the ED at [facility].  Her MRI was reportedly negative but on further review appears to show early pontine  infarct.  Diplopia resolved, but patient developed left sided weakness and presented to the ED again.  MRI revealed right pontine infarct.  Her symptoms have slightly improved.  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  CTA head & neck No LVO, unchanged right cavernous carotid and left V4 segment stenosis MRI  Acute small infarct at right pontomedullary junction, additional smaller acute infarcts along the floor of the fourth ventricle 2D Echo pending LDL 103 HgbA1c 6.8 VTE prophylaxis - lovenox    Diet   Diet Carb Modified Fluid consistency: Thin; Room service appropriate? Yes   No antithrombotic prior to admission, now on aspirin 81 mg daily and clopidogrel 75 mg daily for three weeks then aspirin alone Therapy recommendations:  outpatient PT Disposition:  pending  Hypertension Home meds:  atenolol 50 mg daily Stable Permissive hypertension (OK if < 220/120) but gradually normalize in 5-7 days Long-term BP goal normotensive  Hyperlipidemia Home meds:  none LDL 103, goal < 70 Add rosuvastatin 20 mg daily  Continue statin at discharge  Diabetes type II Controlled Home meds:  none HgbA1c 6.8, goal < 7.0 CBGs Recent Labs    02/19/22 2004 02/20/22 0743 02/20/22 1200  GLUCAP 158* 176* 105*    SSI Recommend close PCP follow up for better diabetes control  Other Stroke Risk Factors  Cigarette smoker advised to stop smoking, willing to quit  Other Active Problems Chronic pain Continue home methadone  Hospital day # [place] , MSN, AGACNP-BC [facility] See Amion for schedule and pager information 02/20/2022 1:25 PM   STROKE MD NOTE :  I have personally obtained history,examined this patient, reviewed notes, independently viewed imaging studies, participated in medical decision making and plan of care.ROS completed by me personally  and pertinent positives fully documented  I have made any additions or clarifications directly to the above note. Agree with note above.  Patient presented with diplopia and dizziness earlier this week and initial imaging with MRI did not show definitive stroke and was sent home but symptoms worsen and she returns at this time with MRIs that showed small right pontomedullary lacunar infarct.  Recommend aspirin and Plavix for 3 weeks followed by aspirin alone.  Check echocardiogram.  Mobilize out of bed.  Therapy consults.  Discussed with Dr.Ghimire.  Greater than 50% time during this 50-minute visit was spent in counseling and coordination of care about her lacunar stroke discussion about stroke evaluation, prevention and treatment and answering questions  Antony Contras, MD Medical Director [place] Pager: [phone] 02/20/2022 2:41 PM   To contact Stroke Continuity provider, please refer to [email or web address] After hours, contact General Neurology

## 2022-02-20 NOTE — Progress Notes (Signed)
Echo attempted. Patient with PT. Will attempt again as schedule permits.

## 2022-02-20 NOTE — Evaluation (Signed)
Occupational Therapy Evaluation Patient Details Name: Kaylee Murphy MRN: [id number] DOB: 04/10/1961 Today's Date: 02/20/2022   History of Present Illness 61 y.o. female  who presented to ED with complaints of diplopia and left sided weakness that started on 02/15/22. Seen at [facility] with negative brain MRI and CTA head/neck. Weakness worsening and returned to ED; +acute infarct at the right pontomedullary junction;   PMH significant of T2DM, HTN, HLD, depression and anxiety, chronic shoulder pain   Clinical Impression   Prior to this admission patient was living with son and daughter in law and reports full independence in ADLs and IADLs, and still drove. Currently, patient presenting with ith blurred/dizzy headed vision, decreased fine motor coordination and gross motor coordination on left, decreased activity tolerance, and need for min gaurd-min A for ADLs. Vision assessment completed, with eye alignment and gaze appropriate (see vision assessment below for full statement). Given the area of infarct, it is more than likely her vision difficulties are stemming from an altered vestibular system. PT is following for vestibular intervention and tools. Educated patient on activities to complete to increase fine motor coordination on L and not to drive until cleared by an MD. Patient in agreement. OT recommending outpatient OT with Neuro focus to promote increased recovery. OT will continue to follow acutely.      Recommendations for follow up therapy are one component of a multi-disciplinary discharge planning process, led by the attending physician.  Recommendations may be updated based on patient status, additional functional criteria and insurance authorization.   Follow Up Recommendations  Outpatient OT (Neuro)    Assistance Recommended at Discharge Intermittent Supervision/Assistance  Patient can return home with the following A little help with walking and/or transfers;A little help with  bathing/dressing/bathroom;Assistance with cooking/housework;Help with stairs or ramp for entrance;Assist for transportation    Functional Status Assessment  Patient has had a recent decline in their functional status and demonstrates the ability to make significant improvements in function in a reasonable and predictable amount of time.  Equipment Recommendations  None recommended by OT    Recommendations for Other Services       Precautions / Restrictions Precautions Precautions: Fall      Mobility Bed Mobility               General bed mobility comments: up in chair upon OT arrival    Transfers Overall transfer level: Needs assistance Equipment used: None Transfers: Sit to/from Stand Sit to Stand: Supervision           General transfer comment: for safety due to vertigo; no imbalance; taking a few steps forward and back with OT      Balance Overall balance assessment: Needs assistance Sitting-balance support: No upper extremity supported, Feet supported Sitting balance-Leahy Scale: Good     Standing balance support: No upper extremity supported Standing balance-Leahy Scale: Fair Standing balance comment: having vertigo, denies diplopia                           ADL either performed or assessed with clinical judgement   ADL Overall ADL's : Needs assistance/impaired Eating/Feeding: Set up;Sitting   Grooming: Wash/dry hands;Wash/dry face;Set up;Sitting   Upper Body Bathing: Set up;Sitting   Lower Body Bathing: Min guard;Sitting/lateral leans;Sit to/from stand   Upper Body Dressing : Set up;Sitting   Lower Body Dressing: Min guard;Minimal assistance;Sitting/lateral leans;Sit to/from stand   Toilet Transfer: Minimal assistance;Ambulation;Rolling walker (2 wheels)   Toileting- Clothing Manipulation  and Hygiene: Min guard;Sitting/lateral lean;Sit to/from stand       Functional mobility during ADLs: Min guard;Cueing for safety;Cueing for  sequencing;Cane;Rolling walker (2 wheels) General ADL Comments: Patient presenting with blurred/dizzy headed vision, decreased fine motor coordination and gross motor coordination on left, decreased activity tolerance, and need for min gaurd-min A for ADLs     Vision Baseline Vision/History: 0 No visual deficits Ability to See in Adequate Light: 0 Adequate Patient Visual Report: Blurring of vision Vision Assessment?: Yes Eye Alignment: Within Functional Limits Ocular Range of Motion: Within Functional Limits Alignment/Gaze Preference: Within Defined Limits Tracking/Visual Pursuits: Able to track stimulus in all quads without difficulty Convergence: Within functional limits Visual Fields: No apparent deficits Diplopia Assessment: Other (comment) (Dipoplia has stopped, now more blurring of vision and feeling like shes drunk, due to stroke placement, vestibular system is affected, PT following with vestibular strategies)     Perception     Praxis      Pertinent Vitals/Pain Pain Assessment Pain Assessment: No/denies pain     Hand Dominance Right   Extremity/Trunk Assessment Upper Extremity Assessment Upper Extremity Assessment: LUE deficits/detail LUE Deficits / Details: 3+ strength throughout, decreased sensation and fine motor coordination LUE Sensation: decreased light touch LUE Coordination: decreased fine motor;decreased gross motor   Lower Extremity Assessment Lower Extremity Assessment: Defer to PT evaluation   Cervical / Trunk Assessment Cervical / Trunk Assessment: Normal   Communication Communication Communication: No difficulties   Cognition Arousal/Alertness: Awake/alert Behavior During Therapy: WFL for tasks assessed/performed Overall Cognitive Status: Within Functional Limits for tasks assessed                                 General Comments: minimal difficulty word finding but appropriate throughout     General Comments       Exercises      Shoulder Instructions      Home Living Family/patient expects to be discharged to:: Private residence Living Arrangements: Other relatives (son and daughter in law) Available Help at Discharge: Family;Available 24 hours/day (son not working currently) Type of Home: House Home Access: Stairs to enter Technical brewer of Steps: 1 Entrance Stairs-Rails: None Home Layout: Two level;Bed/bath upstairs Alternate Level Stairs-Number of Steps: flight Alternate Level Stairs-Rails: Right Bathroom Shower/Tub: Teacher, early years/pre: Standard Bathroom Accessibility: Yes   Home Equipment: None          Prior Functioning/Environment Prior Level of Function : Independent/Modified Independent;Driving               ADLs Comments: drives, spends time with 6 grandchildren, cooks and completes IADLs        OT Problem List: Decreased strength;Decreased range of motion;Decreased activity tolerance;Impaired balance (sitting and/or standing);Decreased coordination;Decreased safety awareness;Decreased knowledge of use of DME or AE;Decreased knowledge of precautions;Impaired sensation;Impaired UE functional use      OT Treatment/Interventions: Self-care/ADL training;Therapeutic exercise;Energy conservation;DME and/or AE instruction;Cognitive remediation/compensation;Visual/perceptual remediation/compensation;Patient/family education;Balance training;Therapeutic activities;Manual therapy    OT Goals(Current goals can be found in the care plan section) Acute Rehab OT Goals Patient Stated Goal: to get back to independence OT Goal Formulation: With patient Time For Goal Achievement: 03/06/22 Potential to Achieve Goals: Good ADL Goals Pt Will Perform Lower Body Bathing: Independently;sitting/lateral leans;sit to/from stand Pt Will Perform Lower Body Dressing: Independently;sitting/lateral leans;sit to/from stand Pt Will Transfer to Toilet: Independently;ambulating Additional  ADL Goal #1: Patient will be able to verbalize acronym BE FAST  for increased knowledge for stroke management for safe discharge home. Additional ADL Goal #2: Patient will be able to complete 3-4 step trail making task without cues to demonstrate increased congition to promote increased independence. Additional ADL Goal #3: Patient will be able to complete functional task in standing (with use of vestibular tactics to aid in balance) for 3-5 minutes to increase activity tolerance and promote increased independence.  OT Frequency: Min 2X/week    Co-evaluation              AM-PAC OT "6 Clicks" Daily Activity     Outcome Measure Help from another person eating meals?: A Little Help from another person taking care of personal grooming?: A Little Help from another person toileting, which includes using toliet, bedpan, or urinal?: A Little Help from another person bathing (including washing, rinsing, drying)?: A Little Help from another person to put on and taking off regular upper body clothing?: A Little Help from another person to put on and taking off regular lower body clothing?: A Little 6 Click Score: 18   End of Session Nurse Communication: Mobility status  Activity Tolerance: Patient tolerated treatment well Patient left: in chair;with call bell/phone within reach  OT Visit Diagnosis: Unsteadiness on feet (R26.81);Other abnormalities of gait and mobility (R26.89);Muscle weakness (generalized) (M62.81);Other symptoms and signs involving the nervous system (R29.898)                Time: 4081-4481 OT Time Calculation (min): 18 min Charges:  OT General Charges $OT Visit: 1 Visit OT Evaluation $OT Eval Moderate Complexity: 1 Mod  Corinne Ports E. Binnie Vonderhaar, OTR/L Acute Rehabilitation Services [phone]   Ascencion Dike 02/20/2022, 1:25 PM

## 2022-02-20 NOTE — Evaluation (Signed)
+Physical Therapy Evaluation Patient Details Name: Kaylee Murphy MRN: [id number] DOB: 11/12/1960 Today's Date: 02/20/2022  History of Present Illness  61 y.o. female  who presented to ED with complaints of diplopia and left sided weakness that started on 02/15/22. Seen at [facility] with negative brain MRI and CTA head/neck. 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 PTA patient was independent with all mobility. Living with son and has bedroom/shower on second floor. Pt currently requires minguard-supervision assistance due to vertigo more-so than her left-sided weakness. Denies diplopia and with visual fixation of target at ~20" she can suppress the vertigo. Educated on use of hand held target to focus on when up walking. Has been dealing with these issues at home (including ascending/descending stairs to second level) for several days. She had one fall when trying to step up onto a neighbor's porch. Anticipate patient will benefit from PT to address problems listed below.Will continue to follow acutely to maximize functional mobility independence and safety.          Recommendations for follow up therapy are one component of a multi-disciplinary discharge planning process, led by the attending physician.  Recommendations may be updated based on patient status, additional functional criteria and insurance authorization.  Follow Up Recommendations Outpatient PT    Assistance Recommended at Discharge Set up Supervision/Assistance  Patient can return home with the following  Help with stairs or ramp for entrance    Equipment Recommendations None recommended by PT (might benefit from cane; could purchase if needed)  Recommendations for Other Services  OT consult    Functional Status  Assessment Patient has had a recent decline in their functional status and demonstrates the ability to make significant improvements in function in a reasonable and predictable amount of time.     Precautions / Restrictions Precautions Precautions: Fall      Mobility  Bed Mobility Overal bed mobility: Independent                  Transfers Overall transfer level: Needs assistance Equipment used: None Transfers: Sit to/from Stand Sit to Stand: Supervision           General transfer comment: for safety due to vertigo; no imbalance; from bed, toilet, and recliner    Ambulation/Gait Ambulation/Gait assistance: Min guard Gait Distance (Feet): 40 Feet Assistive device: None (lightly touching counter and foot board when accessible) Gait Pattern/deviations: Step-through pattern, Decreased stride length, Wide base of support Gait velocity: decr, cautious Gait velocity interpretation: 1.31 - 2.62 ft/sec, indicative of limited community ambulator   General Gait Details: pt cautious with wider than normal stance; no overt LOB but does prefer light touching of surfaces.  Stairs            Wheelchair Mobility    Modified Rankin (Stroke Patients Only)       Balance Overall balance assessment: Needs assistance Sitting-balance support: No upper extremity supported, Feet supported Sitting balance-Leahy Scale: Good     Standing balance support: No upper extremity supported Standing balance-Leahy Scale: Fair Standing balance comment: having vertigo, denies diplopia                             Pertinent Vitals/Pain Pain Assessment Pain Assessment: No/denies pain    Home  Living Family/patient expects to be discharged to:: Private residence Living Arrangements: Other relatives (son and daughter in law) Available Help at Discharge: Family;Available 24 hours/day (son not working currently) Type of Home: House Home Access: Stairs to enter Entrance  Stairs-Rails: None Technical brewer of Steps: 1 Alternate Level Stairs-Number of Steps: flight Home Layout: Two level;Bed/bath upstairs Home Equipment: None      Prior Function Prior Level of Function : Independent/Modified Independent               ADLs Comments: drives     Hand Dominance   Dominant Hand: Right    Extremity/Trunk Assessment   Upper Extremity Assessment Upper Extremity Assessment: Defer to OT evaluation    Lower Extremity Assessment Lower Extremity Assessment: LLE deficits/detail LLE Deficits / Details: knee extension 3+, ankle DF 3+ LLE Sensation: decreased light touch LLE Coordination: decreased fine motor    Cervical / Trunk Assessment Cervical / Trunk Assessment: Normal  Communication   Communication: No difficulties  Cognition Arousal/Alertness: Awake/alert Behavior During Therapy: WFL for tasks assessed/performed Overall Cognitive Status: Within Functional Limits for tasks assessed                                          General Comments General comments (skin integrity, edema, etc.): HR 60s sats 96% throughout session    Exercises     Assessment/Plan    PT Assessment Patient needs continued PT services  PT Problem List Decreased strength;Decreased balance;Decreased mobility;Decreased coordination;Decreased knowledge of use of DME;Impaired sensation       PT Treatment Interventions DME instruction;Stair training;Gait training;Functional mobility training;Therapeutic activities;Balance training;Neuromuscular re-education;Patient/family education    PT Goals (Current goals can be found in the Care Plan section)  Acute Rehab PT Goals Patient Stated Goal: to get back to her normal self PT Goal Formulation: With patient Time For Goal Achievement: 03/06/22 Potential to Achieve Goals: Good    Frequency Min 4X/week     Co-evaluation               AM-PAC PT "6 Clicks" Mobility  Outcome Measure Help  needed turning from your back to your side while in a flat bed without using bedrails?: None Help needed moving from lying on your back to sitting on the side of a flat bed without using bedrails?: None Help needed moving to and from a bed to a chair (including a wheelchair)?: A Little Help needed standing up from a chair using your arms (e.g., wheelchair or bedside chair)?: A Little Help needed to walk in hospital room?: A Little Help needed climbing 3-5 steps with a railing? : A Little 6 Click Score: 20    End of Session Equipment Utilized During Treatment: Gait belt Activity Tolerance: Patient tolerated treatment well Patient left: in chair;with call bell/phone within reach Nurse Communication: Mobility status;Other (comment) (set up for bath) PT Visit Diagnosis: Unsteadiness on feet (R26.81);Dizziness and giddiness (R42)    Time: 1610-9604 PT Time Calculation (min) (ACUTE ONLY): 26 min   Charges:   PT Evaluation $PT Eval Moderate Complexity: 1 Mod PT Treatments $Neuromuscular Re-education: 8-22 mins         Arby Barrette, PT Acute Rehabilitation Services  Office [phone]   Kaylee Murphy 02/20/2022, 9:47 AM

## 2022-02-20 NOTE — Progress Notes (Signed)
PROGRESS NOTE        PATIENT DETAILS Name: Kaylee Murphy Age: 61 y.o. Sex: female Date of Birth: 10/22/1960 Admit Date: 02/19/2022 Admitting Physician Evalee Mutton Kristeen Mans, MD PCP:Pcp, No  Brief Summary: Patient is a 61 y.o.  female with history of DM-2, HTN, HLD, tobacco use, chronic pain on methadone-presented with diplopia, left facial droop and left-sided weakness.  She was subsequently found to have acute CVA and admitted to the hospitalist service.  Significant events: 6/06> to [facility] ED with diplopia-MRI brain negative-diplopia resolved-discharged home. 6/09>> to [facility] ED with diplopia, left facial droop/left-sided weakness-found to have CVA-admit to [facility].  Significant studies: 6/06>> CTA head/neck: 50% stenosis in cervical/intracranial right cavernous ICA 6/06>> MRI brain: No acute CVA 6/06>> MRI orbit: No acute abnormalities 6/09>> MRI brain: Acute infarct right pontomedullary junction, smaller acute infarcts dorsally/superiorly along the floor of the fourth ventricle. 6/09>> CTA head/neck: Unchanged right cavernous carotid/left V4 segment stenosis-no hemodynamically significant stenosis in the neck. 6/09>> A1c: 6.8  Significant microbiology data: 6/09>> COVID/influenza PCR: Negative  Procedures: None  Consults: Neurology  Subjective: Lying comfortably in bed-denies any chest pain or shortness of breath.  Continues to have left facial droop, and mostly left lower extremity weakness.  Objective: Vitals: Blood pressure (!) 159/78, pulse (!) 36, temperature 97.9 F (36.6 C), temperature source Oral, resp. rate 19, last menstrual period 02/13/2014, SpO2 98 %.   Exam: Gen Exam:Alert awake-not in any distress HEENT:atraumatic, normocephalic Chest: B/L clear to auscultation anteriorly CVS:S1S2 regular Abdomen:soft non tender, non distended Extremities: Left facial droop present-left lower extremity weakness-approximately 3/5. Neurology: Non  focal Skin: no rash  Pertinent Labs/Radiology:    Latest Ref Rng & Units 02/19/2022   10:10 AM 02/16/2022    3:18 AM 02/15/2022    4:29 PM  CBC  WBC 4.0 - 10.5 K/uL 11.6   13.3   Hemoglobin 12.0 - 15.0 g/dL 11.1  13.3  11.1   Hematocrit 36.0 - 46.0 % 36.5  39.0  35.4   Platelets 150 - 400 K/uL 316   275     Lab Results  Component Value Date   NA 136 02/19/2022   K 3.5 02/19/2022   CL 103 02/19/2022   CO2 23 02/19/2022     Assessment/Plan: Acute CVA: Felt to be secondary to small vessel disease-work-up as above-awaiting PT OT/echo/further input from neurology.  Continues to have significant left facial droop and mostly left lower extremity weakness.  Continue aspirin/Plavix/statin.  HTN: Allow permissive hypertension-resume atenolol over the next few days.  HLD: Continue statin-awaiting lipid panel  DM-2: CBG stable-continue SSI  CBG  Recent Labs    02/19/22 2004 02/20/22 0743  GLUCAP 158* 176*    Chronic pain syndrome: Resume methadone-dose verified by pharmacy.  Mild leukocytosis: Likely of no clinical significance-follow periodically-has almost normalized.  Tobacco abuse: Counseled-continue transdermal nicotine  BMI: Estimated body mass index is 25.86 kg/m as calculated from the following:   Height as of 02/16/22: '5\' 5"'$  (1.651 m).   Weight as of 02/16/22: 70.5 kg.   Code status:   Code Status: Full Code   DVT Prophylaxis enoxaparin (LOVENOX) injection 40 mg Start: 02/19/22 2200 Place and maintain sequential compression device Start: 02/19/22 1554    Family Communication: None at bedside   Disposition Plan: Status is: Inpatient Remains inpatient appropriate because: Probably require SNF given ongoing deficits.  Await PT/OT eval-not yet stable for discharge.   Planned Discharge Destination:Skilled nursing facility   Diet: Diet Order             Diet Carb Modified Fluid consistency: Thin; Room service appropriate? Yes  Diet effective now                      Antimicrobial agents: Anti-infectives (From admission, onward)    None        MEDICATIONS: Scheduled Meds:  aspirin EC  81 mg Oral Daily   clopidogrel  75 mg Oral Daily   enoxaparin (LOVENOX) injection  40 mg Subcutaneous Q24H   gabapentin  400 mg Oral BID   insulin aspart  0-9 Units Subcutaneous TID WC   methadone  80 mg Oral q morning   nicotine  14 mg Transdermal Daily   rosuvastatin  20 mg Oral Daily   Continuous Infusions:  sodium chloride Stopped (02/19/22 2231)   PRN Meds:.sodium chloride, acetaminophen **OR** acetaminophen (TYLENOL) oral liquid 160 mg/5 mL **OR** acetaminophen, hydrALAZINE, senna-docusate   I have personally reviewed following labs and imaging studies  LABORATORY DATA: CBC: Recent Labs  Lab 02/15/22 1629 02/16/22 0302 02/16/22 0318 02/19/22 1010  WBC 13.3*  --   --  11.6*  NEUTROABS  --  8.2*  --  6.9  HGB 11.1*  --  13.3 11.1*  HCT 35.4*  --  39.0 36.5  MCV 87.2  --   --  88.8  PLT 275  --   --  470    Basic Metabolic Panel: Recent Labs  Lab 02/15/22 1629 02/16/22 0318 02/19/22 1010  NA 140 140 136  K 3.7 3.4* 3.5  CL 105 100 103  CO2 28  --  23  GLUCOSE 100* 159* 187*  BUN '8 6 7  '$ CREATININE 0.62 0.60 0.89  CALCIUM 8.9  --  8.8*    GFR: Estimated Creatinine Clearance: 66.2 mL/min (by C-G formula based on SCr of 0.89 mg/dL).  Liver Function Tests: Recent Labs  Lab 02/19/22 1010  AST 16  ALT 12  ALKPHOS 79  BILITOT 0.3  PROT 7.1  ALBUMIN 3.4*   No results for input(s): "LIPASE", "AMYLASE" in the last 168 hours. No results for input(s): "AMMONIA" in the last 168 hours.  Coagulation Profile: Recent Labs  Lab 02/16/22 0302 02/19/22 1435  INR 1.1 1.1    Cardiac Enzymes: No results for input(s): "CKTOTAL", "CKMB", "CKMBINDEX", "TROPONINI" in the last 168 hours.  BNP (last 3 results) No results for input(s): "PROBNP" in the last 8760 hours.  Lipid Profile: No results for input(s): "CHOL",  "HDL", "LDLCALC", "TRIG", "CHOLHDL", "LDLDIRECT" in the last 72 hours.  Thyroid Function Tests: No results for input(s): "TSH", "T4TOTAL", "FREET4", "T3FREE", "THYROIDAB" in the last 72 hours.  Anemia Panel: No results for input(s): "VITAMINB12", "FOLATE", "FERRITIN", "TIBC", "IRON", "RETICCTPCT" in the last 72 hours.  Urine analysis:    Component Value Date/Time   COLORURINE YELLOW 02/19/2022 1005   APPEARANCEUR HAZY (A) 02/19/2022 1005   LABSPEC 1.012 02/19/2022 1005   PHURINE 6.0 02/19/2022 1005   GLUCOSEU NEGATIVE 02/19/2022 1005   HGBUR NEGATIVE 02/19/2022 1005   BILIRUBINUR NEGATIVE 02/19/2022 1005   KETONESUR NEGATIVE 02/19/2022 1005   PROTEINUR NEGATIVE 02/19/2022 1005   UROBILINOGEN 0.2 02/17/2014 2027   NITRITE NEGATIVE 02/19/2022 1005   LEUKOCYTESUR TRACE (A) 02/19/2022 1005    Sepsis Labs: Lactic Acid, Venous No results found for: "LATICACIDVEN"  MICROBIOLOGY: Recent Results (from the  past 240 hour(s))  Resp Panel by RT-PCR (Flu A&B, Covid) Urine, Clean Catch     Status: None   Collection Time: 02/19/22  2:35 PM   Specimen: Urine, Clean Catch; Nasal Swab  Result Value Ref Range Status   SARS Coronavirus 2 by RT PCR NEGATIVE NEGATIVE Final    Comment: (NOTE) SARS-CoV-2 target nucleic acids are NOT DETECTED.  The SARS-CoV-2 RNA is generally detectable in upper respiratory specimens during the acute phase of infection. The lowest concentration of SARS-CoV-2 viral copies this assay can detect is 138 copies/mL. A negative result does not preclude SARS-Cov-2 infection and should not be used as the sole basis for treatment or other patient management decisions. A negative result may occur with  improper specimen collection/handling, submission of specimen other than nasopharyngeal swab, presence of viral mutation(s) within the areas targeted by this assay, and inadequate number of viral copies(<138 copies/mL). A negative result must be combined with clinical  observations, patient history, and epidemiological information. The expected result is Negative.  Fact Sheet for Patients:  EntrepreneurPulse.com.au  Fact Sheet for Healthcare Providers:  IncredibleEmployment.be  This test is no t yet approved or cleared by the [place] FDA and  has been authorized for detection and/or diagnosis of SARS-CoV-2 by FDA under an Emergency Use Authorization (EUA). This EUA will remain  in effect (meaning this test can be used) for the duration of the COVID-19 declaration under Section 564(b)(1) of the Act, 21 U.S.C.section 360bbb-3(b)(1), unless the authorization is terminated  or revoked sooner.       Influenza A by PCR NEGATIVE NEGATIVE Final   Influenza B by PCR NEGATIVE NEGATIVE Final    Comment: (NOTE) The Xpert Xpress SARS-CoV-2/FLU/RSV plus assay is intended as an aid in the diagnosis of influenza from Nasopharyngeal swab specimens and should not be used as a sole basis for treatment. Nasal washings and aspirates are unacceptable for Xpert Xpress SARS-CoV-2/FLU/RSV testing.  Fact Sheet for Patients: EntrepreneurPulse.com.au  Fact Sheet for Healthcare Providers: IncredibleEmployment.be  This test is not yet approved or cleared by the [place] FDA and has been authorized for detection and/or diagnosis of SARS-CoV-2 by FDA under an Emergency Use Authorization (EUA). This EUA will remain in effect (meaning this test can be used) for the duration of the COVID-19 declaration under Section 564(b)(1) of the Act, 21 U.S.C. section 360bbb-3(b)(1), unless the authorization is terminated or revoked.  Performed at [facility], [place] [address] [place], [place] [address]     RADIOLOGY STUDIES/RESULTS: CT ANGIO HEAD NECK W WO CM  Result Date: 02/19/2022 CLINICAL DATA:  Generalized weakness, stroke-like symptoms; acute infarcts on same-day MRI EXAM: CT ANGIOGRAPHY  HEAD AND NECK TECHNIQUE: Multidetector CT imaging of the head and neck was performed using the standard protocol during bolus administration of intravenous contrast. Multiplanar CT image reconstructions and MIPs were obtained to evaluate the vascular anatomy. Carotid stenosis measurements (when applicable) are obtained utilizing NASCET criteria, using the distal internal carotid diameter as the denominator. RADIATION DOSE REDUCTION: This exam was performed according to the departmental dose-optimization program which includes automated exposure control, adjustment of the mA and/or kV according to patient size and/or use of iterative reconstruction technique. CONTRAST:  118m OMNIPAQUE IOHEXOL 350 MG/ML SOLN COMPARISON:  02/16/2022 FINDINGS: CT HEAD FINDINGS For noncontrast findings, please see same day CT head. CTA NECK FINDINGS Aortic arch: Standard branching. Imaged portion shows no evidence of aneurysm or dissection. No significant stenosis of the major arch vessel origins. Right carotid  system: No evidence of dissection, occlusion, or hemodynamically significant stenosis (greater than 50%). Left carotid system: No evidence of dissection, occlusion, or hemodynamically significant stenosis (greater than 50%). Vertebral arteries: No evidence of dissection, occlusion, or hemodynamically significant stenosis (greater than 50%). Skeleton: Degenerative changes in the cervical spine. Edentulous. No acute osseous abnormality. Other neck: No acute finding. Upper chest: Centrilobular and paraseptal emphysema. No focal pulmonary opacity or pleural effusion. Review of the MIP images confirms the above findings CTA HEAD FINDINGS Anterior circulation: Both internal carotid arteries are patent to the termini, with calcified and noncalcified plaque in the right cavernous carotid causing at least 50% stenosis similar to the prior exam. Hypoplastic or aplastic right A1. Patent left A1. Normal anterior communicating artery.  Anterior cerebral arteries are patent to their distal aspects. No M1 stenosis or occlusion. Normal MCA bifurcations. Distal MCA branches perfused and symmetric. Posterior circulation: Vertebral arteries patent to the vertebrobasilar junction, with redemonstrated mild-to-moderate focal stenosis in the left V4 segment. Left dominant system. Posterior inferior cerebellar arteries patent proximally. Basilar patent to its distal aspect. Superior cerebellar arteries patent proximally. Patent P1 segments. PCAs perfused to their distal aspects without stenosis. The bilateral posterior communicating arteries are not visualized. Venous sinuses: As permitted by contrast timing, patent. Anatomic variants: None significant. Review of the MIP images confirms the above findings IMPRESSION: 1. No intracranial large vessel occlusion or new stenosis. Unchanged right cavernous carotid and left V4 segment stenosis. 2.  No hemodynamically significant stenosis in the neck. 3.  Emphysema (ICD10-J43.9). Electronically Signed   By: Merilyn Baba M.D.   On: 02/19/2022 18:53   MR BRAIN WO CONTRAST  Result Date: 02/19/2022 CLINICAL DATA:  Neuro deficit, acute, stroke suspected EXAM: MRI HEAD WITHOUT CONTRAST TECHNIQUE: Multiplanar, multiecho pulse sequences of the brain and surrounding structures were obtained without intravenous contrast. COMPARISON:  02/16/2022 FINDINGS: Brain: New small focus of reduced diffusion at the right pontomedullary junction. There is also some involvement more posterosuperiorly along the floor of the fourth ventricle. No evidence of intracranial hemorrhage. Ventricles and sulci are stable in size and configuration. Unchanged patchy small foci of T2 hyperintensity in the supratentorial white matter that may reflect minor chronic microvascular ischemic changes. There is no intracranial mass or mass effect. No hydrocephalus or extra-axial collection. Vascular: Major vessel flow voids at the skull base are  preserved. Skull and upper cervical spine: Normal marrow signal is preserved. As noted on the prior study, a disc osteophyte complex at C3-C4 may cause significant canal narrowing. Sinuses/Orbits: Paranasal sinuses are aerated. Orbits are unremarkable. Other: Sella is expanded and "empty."  Mastoid air cells are clear. 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 Additional smaller acute infarcts more dorsally and superiorly along the floor of the fourth ventricle. Electronically Signed   By: Macy Mis M.D.   On: 02/19/2022 14:39   CT Head Wo Contrast  Result Date: 02/19/2022 CLINICAL DATA:  Weakness. EXAM: CT HEAD WITHOUT CONTRAST TECHNIQUE: Contiguous axial images were obtained from the base of the skull through the vertex without intravenous contrast. RADIATION DOSE REDUCTION: This exam was performed according to the departmental dose-optimization program which includes automated exposure control, adjustment of the mA and/or kV according to patient size and/or use of iterative reconstruction technique. COMPARISON:  MRI of the brain on 02/16/2022 FINDINGS: Brain: The brain demonstrates no evidence of hemorrhage, infarction, edema, mass effect, extra-axial fluid collection, hydrocephalus or mass lesion. Vascular: No hyperdense vessel or unexpected calcification. Skull: Normal. Negative for fracture  or focal lesion. Sinuses/Orbits: No acute finding. Other: None. IMPRESSION: No acute findings by CT of the head without contrast. Electronically Signed   By: Aletta Edouard M.D.   On: 02/19/2022 10:49     LOS: 0 days   Oren Binet, MD  [facility]    To contact the attending provider between 7A-7P or the covering provider during after hours 7P-7A, please log into the web site [email or web address] and access using universal [place] password for that web site. If you do not have the password, please call the hospital operator.  02/20/2022, 8:49 AM

## 2022-02-21 DIAGNOSIS — I10 Essential (primary) hypertension: Secondary | ICD-10-CM | POA: Diagnosis not present

## 2022-02-21 DIAGNOSIS — M25519 Pain in unspecified shoulder: Secondary | ICD-10-CM | POA: Diagnosis not present

## 2022-02-21 DIAGNOSIS — I639 Cerebral infarction, unspecified: Secondary | ICD-10-CM | POA: Diagnosis not present

## 2022-02-21 DIAGNOSIS — E119 Type 2 diabetes mellitus without complications: Secondary | ICD-10-CM

## 2022-02-21 LAB — GLUCOSE, CAPILLARY
Glucose-Capillary: 174 mg/dL — ABNORMAL HIGH (ref 70–99)
Glucose-Capillary: 124 mg/dL — ABNORMAL HIGH (ref 70–99)
Glucose-Capillary: 154 mg/dL — ABNORMAL HIGH (ref 70–99)
Glucose-Capillary: 118 mg/dL — ABNORMAL HIGH (ref 70–99)

## 2022-02-21 NOTE — Progress Notes (Signed)
STROKE TEAM PROGRESS NOTE   INTERVAL HISTORY Patient is seen in her room with no family at the bedside.   She continues to have left hemiparesis and therapy evaluation recommended inpatient rehab.  Echocardiogram shows EF 45 to 50% with inferior and inferior lateral wall hypokinesis.  Vitals:   02/21/22 0400 02/21/22 0519 02/21/22 0751 02/21/22 1136  BP: (!) 173/85 (!) 169/84 (!) 175/70 (!) 146/85  Pulse: (!) 47 (!) 50 (!) 51 65  Resp: '12 20 18 18  '$ Temp:  98.3 F (36.8 C) 98.8 F (37.1 C) 98.6 F (37 C)  TempSrc:  Oral Oral Oral  SpO2: 96% 99% 97% 98%   CBC:  Recent Labs  Lab 02/15/22 1629 02/16/22 0302 02/16/22 0318 02/19/22 1010  WBC 13.3*  --   --  11.6*  NEUTROABS  --  8.2*  --  6.9  HGB 11.1*  --  13.3 11.1*  HCT 35.4*  --  39.0 36.5  MCV 87.2  --   --  88.8  PLT 275  --   --  081   Basic Metabolic Panel:  Recent Labs  Lab 02/15/22 1629 02/16/22 0318 02/19/22 1010  NA 140 140 136  K 3.7 3.4* 3.5  CL 105 100 103  CO2 28  --  23  GLUCOSE 100* 159* 187*  BUN '8 6 7  '$ CREATININE 0.62 0.60 0.89  CALCIUM 8.9  --  8.8*   Lipid Panel:  Recent Labs  Lab 02/20/22 0933  CHOL 174  TRIG 205*  HDL 30*  CHOLHDL 5.8  VLDL 41*  LDLCALC 103*   HgbA1c:  Recent Labs  Lab 02/19/22 1530  HGBA1C 6.8*   Urine Drug Screen:  Recent Labs  Lab 02/19/22 1435  LABOPIA NONE DETECTED  COCAINSCRNUR NONE DETECTED  LABBENZ POSITIVE*  AMPHETMU NONE DETECTED  THCU NONE DETECTED  LABBARB NONE DETECTED    Alcohol Level  Recent Labs  Lab 02/19/22 1435  ETH <10    IMAGING past 24 hours ECHOCARDIOGRAM COMPLETE  Result Date: 02/20/2022    ECHOCARDIOGRAM REPORT   Patient Name:   Kaylee Murphy Date of Exam: 02/20/2022 Medical Rec #:  [id number]       Height:       65.0 in Accession #:    [id number]      Weight:       155.4 lb Date of Birth:  [birth year]       BSA:          1.777 m Patient Age:    61 years        BP:           159/78 mmHg Patient Gender: F               HR:            48 bpm. Exam Location:  Inpatient Procedure: 2D Echo, Cardiac Doppler and Color Doppler Indications:    Stroke  History:        Patient has no prior history of Echocardiogram examinations.                 Risk Factors:Current Smoker, Hypertension and Diabetes.  Sonographer:    Clayton Lefort RDCS (AE) Referring Phys: [id number] [place]  1. Hypokinesis of the inferior and inferolateral wall with overall mild LV dysfunction.  2. Left ventricular ejection fraction, by estimation, is 45 to 50%. The left ventricle has mildly decreased function. The left ventricle demonstrates regional wall motion abnormalities (see  scoring diagram/findings for description). The left ventricular  internal cavity size was mildly dilated. There is mild left ventricular hypertrophy. Left ventricular diastolic parameters are consistent with Grade I diastolic dysfunction (impaired relaxation). Elevated left atrial pressure.  3. Right ventricular systolic function is normal. The right ventricular size is normal.  4. The mitral valve is normal in structure. Trivial mitral valve regurgitation. No evidence of mitral stenosis.  5. The aortic valve is tricuspid. Aortic valve regurgitation is not visualized. Aortic valve sclerosis/calcification is present, without any evidence of aortic stenosis.  6. The inferior vena cava is normal in size with greater than 50% respiratory variability, suggesting right atrial pressure of 3 mmHg. FINDINGS  Left Ventricle: Left ventricular ejection fraction, by estimation, is 45 to 50%. The left ventricle has mildly decreased function. The left ventricle demonstrates regional wall motion abnormalities. The left ventricular internal cavity size was mildly dilated. There is mild left ventricular hypertrophy. Left ventricular diastolic parameters are consistent with Grade I diastolic dysfunction (impaired relaxation). Elevated left atrial pressure. Right Ventricle: The right ventricular size is normal.  Right ventricular systolic function is normal. Left Atrium: Left atrial size was normal in size. Right Atrium: Right atrial size was normal in size. Pericardium: There is no evidence of pericardial effusion. Mitral Valve: The mitral valve is normal in structure. Trivial mitral valve regurgitation. No evidence of mitral valve stenosis. Tricuspid Valve: The tricuspid valve is normal in structure. Tricuspid valve regurgitation is trivial. No evidence of tricuspid stenosis. Aortic Valve: The aortic valve is tricuspid. Aortic valve regurgitation is not visualized. Aortic valve sclerosis/calcification is present, without any evidence of aortic stenosis. Aortic valve mean gradient measures 5.0 mmHg. Aortic valve peak gradient measures 8.6 mmHg. Pulmonic Valve: The pulmonic valve was normal in structure. Pulmonic valve regurgitation is not visualized. No evidence of pulmonic stenosis. Aorta: The aortic root is normal in size and structure. Venous: The inferior vena cava is normal in size with greater than 50% respiratory variability, suggesting right atrial pressure of 3 mmHg. IAS/Shunts: No atrial level shunt detected by color flow Doppler. Additional Comments: Hypokinesis of the inferior and inferolateral wall with overall mild LV dysfunction.  LEFT VENTRICLE PLAX 2D LVIDd:         5.30 cm      Diastology LVIDs:         3.90 cm      LV e' medial:    4.03 cm/s LV PW:         1.20 cm      LV E/e' medial:  15.5 LV IVS:        1.20 cm      LV e' lateral:   4.90 cm/s LVOT diam:     2.30 cm      LV E/e' lateral: 12.7 LV SV:         67 LV SV Index:   38 LVOT Area:     4.15 cm  LV Volumes (MOD) LV vol d, MOD A2C: 117.0 ml LV vol d, MOD A4C: 110.0 ml LV vol s, MOD A2C: 56.0 ml LV vol s, MOD A4C: 60.8 ml LV SV MOD A2C:     61.0 ml LV SV MOD A4C:     110.0 ml LV SV MOD BP:      54.9 ml RIGHT VENTRICLE          IVC RV Basal diam:  3.30 cm  IVC diam: 1.70 cm TAPSE (M-mode): 2.8 cm LEFT ATRIUM  Index        RIGHT ATRIUM            Index LA diam:        3.70 cm 2.08 cm/m   RA Area:     15.20 cm LA Vol (A2C):   49.6 ml 27.91 ml/m  RA Volume:   39.60 ml  22.28 ml/m LA Vol (A4C):   34.6 ml 19.47 ml/m LA Biplane Vol: 43.3 ml 24.37 ml/m  AORTIC VALVE AV Area (Vmax):    2.00 cm AV Area (Vmean):   1.88 cm AV Area (VTI):     1.83 cm AV Vmax:           147.00 cm/s AV Vmean:          97.100 cm/s AV VTI:            0.366 m AV Peak Grad:      8.6 mmHg AV Mean Grad:      5.0 mmHg LVOT Vmax:         70.60 cm/s LVOT Vmean:        43.900 cm/s LVOT VTI:          0.161 m LVOT/AV VTI ratio: 0.44  AORTA Ao Root diam: 3.10 cm Ao Asc diam:  2.90 cm MITRAL VALVE MV Area (PHT): 1.52 cm    SHUNTS MV Decel Time: 500 msec    Systemic VTI:  0.16 m MV E velocity: 62.30 cm/s  Systemic Diam: 2.30 cm MV A velocity: 65.50 cm/s MV E/A ratio:  0.95 Kirk Ruths MD Electronically signed by Kirk Ruths MD Signature Date/Time: 02/20/2022/2:59:06 PM    Final     PHYSICAL EXAM General:  Alert, frail middle-aged African-American lady in no acute distress Respiratory:  Regular, unlabored respirations on room air  NEURO:  Mental Status: AA&Ox3  Speech/Language: speech is without dysarthria or aphasia.  Fluency, and comprehension intact.  Cranial Nerves:  II: PERRL. Visual fields full.  III, IV, VI: EOMI. Eyelids elevate symmetrically.  V: Sensation is intact to light touch and symmetrical to face.  VII: Slight right facial droop  VIII: hearing intact to voice. IX, X: Phonation is normal.  XII: tongue is midline without fasciculations. Motor: 5/5 strength to RUE, RLE and LLE, 4+/5 to LUE.  Diminished fine finger movements on the left.  Orbits right over left upper extremity.  Mild left grip weakness. Tone: is normal and bulk is normal Sensation- Diminished on left side Coordination: FTN intact bilaterally, HKS: no ataxia in BLE. Gait- deferred   ASSESSMENT/PLAN Kaylee Murphy is a 61 y.o. female with history of DM, HTN, HLD, smoking,  depression, anxiety and chronic back pain presenting with an episode of diplopia on 6/5 for which she was seen in the ED at [facility].  Her MRI was reportedly negative but on further review appears to show early pontine infarct.  Diplopia resolved, but patient developed left sided weakness and presented to the ED again.  MRI revealed right pontine infarct.  Her symptoms have slightly improved.  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  CTA head & neck No LVO, unchanged right cavernous carotid and left V4 segment stenosis MRI  Acute small infarct at right pontomedullary junction, additional smaller acute infarcts along the floor of the fourth ventricle 2D Echo pending LDL 103 HgbA1c 6.8 VTE prophylaxis - lovenox    Diet   Diet Carb Modified  Fluid consistency: Thin; Room service appropriate? Yes   No antithrombotic prior to admission, now on aspirin 81 mg daily and clopidogrel 75 mg daily for three weeks then aspirin alone Therapy recommendations:  outpatient PT Disposition:  pending  Hypertension Home meds:  atenolol 50 mg daily Stable Permissive hypertension (OK if < 220/120) but gradually normalize in 5-7 days Long-term BP goal normotensive  Hyperlipidemia Home meds:  none LDL 103, goal < 70 Add rosuvastatin 20 mg daily  Continue statin at discharge  Diabetes type II Controlled Home meds:  none HgbA1c 6.8, goal < 7.0 CBGs Recent Labs    02/20/22 2107 02/21/22 0749 02/21/22 1135  GLUCAP 123* 174* 124*    SSI Recommend close PCP follow up for better diabetes control  Other Stroke Risk Factors  Cigarette smoker advised to stop smoking, willing to quit  Other Active Problems Chronic pain Continue home methadone  Hospital day # 1  Patient presented with diplopia and dizziness earlier this week and initial imaging with MRI did not show definitive stroke and was  sent home but symptoms worsened and she returned and repeat MRI  showed small right pontomedullary lacunar infarct.  Recommend aspirin and Plavix for 3 weeks followed by aspirin alone.  Therapy recommends inpatient rehab.  Medically stable to be transferred to inpatient rehab when bed available.  Stroke team will sign off.  Follow-up as an outpatient stroke clinic in 2 months   discussed with Dr.Ghimire.  Greater than 50% time during this 35-minute visit was spent in counseling and coordination of care about her lacunar stroke discussion about stroke evaluation, prevention and treatment and answering questions  Antony Contras, MD Medical Director [place] Pager: [phone] 02/21/2022 11:57 AM   To contact Stroke Continuity provider, please refer to [email or web address] After hours, contact General Neurology

## 2022-02-21 NOTE — Progress Notes (Signed)
PROGRESS NOTE        PATIENT DETAILS Name: Kaylee Murphy Age: 61 y.o. Sex: female Date of Birth: 1961-06-01 Admit Date: 02/19/2022 Admitting Physician Evalee Mutton Kristeen Mans, MD PCP:Pcp, No  Brief Summary: Patient is a 61 y.o.  female with history of DM-2, HTN, HLD, tobacco use, chronic pain on methadone-presented with diplopia, left facial droop and left-sided weakness.  She was subsequently found to have acute CVA and admitted to the hospitalist service.  Significant events: 6/06> to [facility] ED with diplopia-MRI brain negative-diplopia resolved-discharged home. 6/09>> to [facility] ED with diplopia, left facial droop/left-sided weakness-found to have CVA-admit to [facility].  Significant studies: 6/06>> CTA head/neck: 50% stenosis in cervical/intracranial right cavernous ICA 6/06>> MRI brain: No acute CVA 6/06>> MRI orbit: No acute abnormalities 6/09>> MRI brain: Acute infarct right pontomedullary junction, smaller acute infarcts dorsally/superiorly along the floor of the fourth ventricle. 6/09>> CTA head/neck: Unchanged right cavernous carotid/left V4 segment stenosis-no hemodynamically significant stenosis in the neck. 6/09>> A1c: 6.8 6/10>> LDL: 103 6/10>> TTE: EF 45-50%, with numerous regional wall motion abnormalities.  Significant microbiology data: 6/09>> COVID/influenza PCR: Negative  Procedures: None  Consults: Neurology  Subjective: Continues to have vertigo-wants to wait 1 more day in the hospital before discharge-she has no one at home-her son will arrive in town tomorrow morning.  Objective: Vitals: Blood pressure (!) 175/70, pulse (!) 51, temperature 98.8 F (37.1 C), temperature source Oral, resp. rate 18, last menstrual period 02/13/2014, SpO2 97 %.   Exam: Gen Exam:Alert awake-not in any distress HEENT:atraumatic, normocephalic Chest: B/L clear to auscultation anteriorly CVS:S1S2 regular Abdomen:soft non tender, non distended Extremities:no  edema Neurology: Left facial droop/mild LLE weakness. Skin: no rash   Pertinent Labs/Radiology:    Latest Ref Rng & Units 02/19/2022   10:10 AM 02/16/2022    3:18 AM 02/15/2022    4:29 PM  CBC  WBC 4.0 - 10.5 K/uL 11.6   13.3   Hemoglobin 12.0 - 15.0 g/dL 11.1  13.3  11.1   Hematocrit 36.0 - 46.0 % 36.5  39.0  35.4   Platelets 150 - 400 K/uL 316   275     Lab Results  Component Value Date   NA 136 02/19/2022   K 3.5 02/19/2022   CL 103 02/19/2022   CO2 23 02/19/2022     Assessment/Plan: Acute CVA: Felt to be secondary to small vessel disease-work-up as above-some mild LLE weakness-left facial droop persists.  Recommendations from neurology are to continue aspirin/Plavix x3 weeks followed by splint alone.  On high intensity statin.  Will need outpatient follow-up with neurology.  Recommendations by PT for outpatient PT.   Newly diagnosed chronic HFrEF with wall motion abnormality: Incidentally seen on echo-volume status is stable-discussed with cardiologist-Dr. Patwardhan-outpatient work-up will be arranged.  HTN: Allow permissive hypertension-resume atenolol over the next few days.  HLD: Continue statin-awaiting lipid panel  DM-2: CBG stable-continue SSI  CBG  Recent Labs    02/20/22 1626 02/20/22 2107 02/21/22 0749  GLUCAP 134* 123* 174*     Chronic pain syndrome: Continue methadone.  Dose verified by pharmacy..  Mild leukocytosis: Likely of no clinical significance-follow periodically-has almost normalized.  Tobacco abuse: Counseled-continue transdermal nicotine  BMI: Estimated body mass index is 25.86 kg/m as calculated from the following:   Height as of 02/16/22: '5\' 5"'$  (1.651 m).   Weight as of 02/16/22:  70.5 kg.   Code status:   Code Status: Full Code   DVT Prophylaxis enoxaparin (LOVENOX) injection 40 mg Start: 02/19/22 2200 Place and maintain sequential compression device Start: 02/19/22 1554    Family Communication: None at bedside   Disposition  Plan: Status is: Inpatient Remains inpatient appropriate because: Ongoing vertigo-unsafe discharge as patient has no family member at home today-we will likely go home with outpatient PT on 6/12 and a son arrives from out of town.   Planned Discharge Destination: Outpatient physical therapy on 6/12   Diet Order             Diet Carb Modified Fluid consistency: Thin; Room service appropriate? Yes  Diet effective now                     Antimicrobial agents: Anti-infectives (From admission, onward)    None        MEDICATIONS: Scheduled Meds:  aspirin EC  81 mg Oral Daily   clopidogrel  75 mg Oral Daily   enoxaparin (LOVENOX) injection  40 mg Subcutaneous Q24H   gabapentin  400 mg Oral BID   insulin aspart  0-9 Units Subcutaneous TID WC   methadone  80 mg Oral q morning   nicotine  14 mg Transdermal Daily   rosuvastatin  20 mg Oral Daily   Continuous Infusions:  sodium chloride Stopped (02/19/22 2231)   PRN Meds:.sodium chloride, acetaminophen **OR** acetaminophen (TYLENOL) oral liquid 160 mg/5 mL **OR** acetaminophen, hydrALAZINE, senna-docusate   I have personally reviewed following labs and imaging studies  LABORATORY DATA: CBC: Recent Labs  Lab 02/15/22 1629 02/16/22 0302 02/16/22 0318 02/19/22 1010  WBC 13.3*  --   --  11.6*  NEUTROABS  --  8.2*  --  6.9  HGB 11.1*  --  13.3 11.1*  HCT 35.4*  --  39.0 36.5  MCV 87.2  --   --  88.8  PLT 275  --   --  316     Basic Metabolic Panel: Recent Labs  Lab 02/15/22 1629 02/16/22 0318 02/19/22 1010  NA 140 140 136  K 3.7 3.4* 3.5  CL 105 100 103  CO2 28  --  23  GLUCOSE 100* 159* 187*  BUN '8 6 7  '$ CREATININE 0.62 0.60 0.89  CALCIUM 8.9  --  8.8*     GFR: Estimated Creatinine Clearance: 66.2 mL/min (by C-G formula based on SCr of 0.89 mg/dL).  Liver Function Tests: Recent Labs  Lab 02/19/22 1010  AST 16  ALT 12  ALKPHOS 79  BILITOT 0.3  PROT 7.1  ALBUMIN 3.4*    No results for  input(s): "LIPASE", "AMYLASE" in the last 168 hours. No results for input(s): "AMMONIA" in the last 168 hours.  Coagulation Profile: Recent Labs  Lab 02/16/22 0302 02/19/22 1435  INR 1.1 1.1     Cardiac Enzymes: No results for input(s): "CKTOTAL", "CKMB", "CKMBINDEX", "TROPONINI" in the last 168 hours.  BNP (last 3 results) No results for input(s): "PROBNP" in the last 8760 hours.  Lipid Profile: Recent Labs    02/20/22 0933  CHOL 174  HDL 30*  LDLCALC 103*  TRIG 205*  CHOLHDL 5.8    Thyroid Function Tests: No results for input(s): "TSH", "T4TOTAL", "FREET4", "T3FREE", "THYROIDAB" in the last 72 hours.  Anemia Panel: No results for input(s): "VITAMINB12", "FOLATE", "FERRITIN", "TIBC", "IRON", "RETICCTPCT" in the last 72 hours.  Urine analysis:    Component Value Date/Time   COLORURINE YELLOW  02/19/2022 1005   APPEARANCEUR HAZY (A) 02/19/2022 1005   LABSPEC 1.012 02/19/2022 1005   PHURINE 6.0 02/19/2022 1005   GLUCOSEU NEGATIVE 02/19/2022 1005   HGBUR NEGATIVE 02/19/2022 1005   BILIRUBINUR NEGATIVE 02/19/2022 1005   KETONESUR NEGATIVE 02/19/2022 1005   PROTEINUR NEGATIVE 02/19/2022 1005   UROBILINOGEN 0.2 02/17/2014 2027   NITRITE NEGATIVE 02/19/2022 1005   LEUKOCYTESUR TRACE (A) 02/19/2022 1005    Sepsis Labs: Lactic Acid, Venous No results found for: "LATICACIDVEN"  MICROBIOLOGY: Recent Results (from the past 240 hour(s))  Resp Panel by RT-PCR (Flu A&B, Covid) Urine, Clean Catch     Status: None   Collection Time: 02/19/22  2:35 PM   Specimen: Urine, Clean Catch; Nasal Swab  Result Value Ref Range Status   SARS Coronavirus 2 by RT PCR NEGATIVE NEGATIVE Final    Comment: (NOTE) SARS-CoV-2 target nucleic acids are NOT DETECTED.  The SARS-CoV-2 RNA is generally detectable in upper respiratory specimens during the acute phase of infection. The lowest concentration of SARS-CoV-2 viral copies this assay can detect is 138 copies/mL. A negative result  does not preclude SARS-Cov-2 infection and should not be used as the sole basis for treatment or other patient management decisions. A negative result may occur with  improper specimen collection/handling, submission of specimen other than nasopharyngeal swab, presence of viral mutation(s) within the areas targeted by this assay, and inadequate number of viral copies(<138 copies/mL). A negative result must be combined with clinical observations, patient history, and epidemiological information. The expected result is Negative.  Fact Sheet for Patients:  EntrepreneurPulse.com.au  Fact Sheet for Healthcare Providers:  IncredibleEmployment.be  This test is no t yet approved or cleared by the [place] FDA and  has been authorized for detection and/or diagnosis of SARS-CoV-2 by FDA under an Emergency Use Authorization (EUA). This EUA will remain  in effect (meaning this test can be used) for the duration of the COVID-19 declaration under Section 564(b)(1) of the Act, 21 U.S.C.section 360bbb-3(b)(1), unless the authorization is terminated  or revoked sooner.       Influenza A by PCR NEGATIVE NEGATIVE Final   Influenza B by PCR NEGATIVE NEGATIVE Final    Comment: (NOTE) The Xpert Xpress SARS-CoV-2/FLU/RSV plus assay is intended as an aid in the diagnosis of influenza from Nasopharyngeal swab specimens and should not be used as a sole basis for treatment. Nasal washings and aspirates are unacceptable for Xpert Xpress SARS-CoV-2/FLU/RSV testing.  Fact Sheet for Patients: EntrepreneurPulse.com.au  Fact Sheet for Healthcare Providers: IncredibleEmployment.be  This test is not yet approved or cleared by the [place] FDA and has been authorized for detection and/or diagnosis of SARS-CoV-2 by FDA under an Emergency Use Authorization (EUA). This EUA will remain in effect (meaning this test can be used) for  the duration of the COVID-19 declaration under Section 564(b)(1) of the Act, 21 U.S.C. section 360bbb-3(b)(1), unless the authorization is terminated or revoked.  Performed at [facility], [place] [address] [place], [place] [address]     RADIOLOGY STUDIES/RESULTS: ECHOCARDIOGRAM COMPLETE  Result Date: 02/20/2022    ECHOCARDIOGRAM REPORT   Patient Name:   Kaylee Murphy Date of Exam: 02/20/2022 Medical Rec #:  [id number]       Height:       65.0 in Accession #:    [id number]      Weight:       155.4 lb Date of Birth:  [birth year]       BSA:  1.777 m Patient Age:    61 years        BP:           159/78 mmHg Patient Gender: F               HR:           48 bpm. Exam Location:  Inpatient Procedure: 2D Echo, Cardiac Doppler and Color Doppler Indications:    Stroke  History:        Patient has no prior history of Echocardiogram examinations.                 Risk Factors:Current Smoker, Hypertension and Diabetes.  Sonographer:    Clayton Lefort RDCS (AE) Referring Phys: [id number] [place]  1. Hypokinesis of the inferior and inferolateral wall with overall mild LV dysfunction.  2. Left ventricular ejection fraction, by estimation, is 45 to 50%. The left ventricle has mildly decreased function. The left ventricle demonstrates regional wall motion abnormalities (see scoring diagram/findings for description). The left ventricular  internal cavity size was mildly dilated. There is mild left ventricular hypertrophy. Left ventricular diastolic parameters are consistent with Grade I diastolic dysfunction (impaired relaxation). Elevated left atrial pressure.  3. Right ventricular systolic function is normal. The right ventricular size is normal.  4. The mitral valve is normal in structure. Trivial mitral valve regurgitation. No evidence of mitral stenosis.  5. The aortic valve is tricuspid. Aortic valve regurgitation is not visualized. Aortic valve sclerosis/calcification is present, without any  evidence of aortic stenosis.  6. The inferior vena cava is normal in size with greater than 50% respiratory variability, suggesting right atrial pressure of 3 mmHg. FINDINGS  Left Ventricle: Left ventricular ejection fraction, by estimation, is 45 to 50%. The left ventricle has mildly decreased function. The left ventricle demonstrates regional wall motion abnormalities. The left ventricular internal cavity size was mildly dilated. There is mild left ventricular hypertrophy. Left ventricular diastolic parameters are consistent with Grade I diastolic dysfunction (impaired relaxation). Elevated left atrial pressure. Right Ventricle: The right ventricular size is normal. Right ventricular systolic function is normal. Left Atrium: Left atrial size was normal in size. Right Atrium: Right atrial size was normal in size. Pericardium: There is no evidence of pericardial effusion. Mitral Valve: The mitral valve is normal in structure. Trivial mitral valve regurgitation. No evidence of mitral valve stenosis. Tricuspid Valve: The tricuspid valve is normal in structure. Tricuspid valve regurgitation is trivial. No evidence of tricuspid stenosis. Aortic Valve: The aortic valve is tricuspid. Aortic valve regurgitation is not visualized. Aortic valve sclerosis/calcification is present, without any evidence of aortic stenosis. Aortic valve mean gradient measures 5.0 mmHg. Aortic valve peak gradient measures 8.6 mmHg. Pulmonic Valve: The pulmonic valve was normal in structure. Pulmonic valve regurgitation is not visualized. No evidence of pulmonic stenosis. Aorta: The aortic root is normal in size and structure. Venous: The inferior vena cava is normal in size with greater than 50% respiratory variability, suggesting right atrial pressure of 3 mmHg. IAS/Shunts: No atrial level shunt detected by color flow Doppler. Additional Comments: Hypokinesis of the inferior and inferolateral wall with overall mild LV dysfunction.  LEFT  VENTRICLE PLAX 2D LVIDd:         5.30 cm      Diastology LVIDs:         3.90 cm      LV e' medial:    4.03 cm/s LV PW:         1.20  cm      LV E/e' medial:  15.5 LV IVS:        1.20 cm      LV e' lateral:   4.90 cm/s LVOT diam:     2.30 cm      LV E/e' lateral: 12.7 LV SV:         67 LV SV Index:   38 LVOT Area:     4.15 cm  LV Volumes (MOD) LV vol d, MOD A2C: 117.0 ml LV vol d, MOD A4C: 110.0 ml LV vol s, MOD A2C: 56.0 ml LV vol s, MOD A4C: 60.8 ml LV SV MOD A2C:     61.0 ml LV SV MOD A4C:     110.0 ml LV SV MOD BP:      54.9 ml RIGHT VENTRICLE          IVC RV Basal diam:  3.30 cm  IVC diam: 1.70 cm TAPSE (M-mode): 2.8 cm LEFT ATRIUM             Index        RIGHT ATRIUM           Index LA diam:        3.70 cm 2.08 cm/m   RA Area:     15.20 cm LA Vol (A2C):   49.6 ml 27.91 ml/m  RA Volume:   39.60 ml  22.28 ml/m LA Vol (A4C):   34.6 ml 19.47 ml/m LA Biplane Vol: 43.3 ml 24.37 ml/m  AORTIC VALVE AV Area (Vmax):    2.00 cm AV Area (Vmean):   1.88 cm AV Area (VTI):     1.83 cm AV Vmax:           147.00 cm/s AV Vmean:          97.100 cm/s AV VTI:            0.366 m AV Peak Grad:      8.6 mmHg AV Mean Grad:      5.0 mmHg LVOT Vmax:         70.60 cm/s LVOT Vmean:        43.900 cm/s LVOT VTI:          0.161 m LVOT/AV VTI ratio: 0.44  AORTA Ao Root diam: 3.10 cm Ao Asc diam:  2.90 cm MITRAL VALVE MV Area (PHT): 1.52 cm    SHUNTS MV Decel Time: 500 msec    Systemic VTI:  0.16 m MV E velocity: 62.30 cm/s  Systemic Diam: 2.30 cm MV A velocity: 65.50 cm/s MV E/A ratio:  0.95 Kirk Ruths MD Electronically signed by Kirk Ruths MD Signature Date/Time: 02/20/2022/2:59:06 PM    Final    CT ANGIO HEAD NECK W WO CM  Result Date: 02/19/2022 CLINICAL DATA:  Generalized weakness, stroke-like symptoms; acute infarcts on same-day MRI EXAM: CT ANGIOGRAPHY HEAD AND NECK TECHNIQUE: Multidetector CT imaging of the head and neck was performed using the standard protocol during bolus administration of intravenous contrast.  Multiplanar CT image reconstructions and MIPs were obtained to evaluate the vascular anatomy. Carotid stenosis measurements (when applicable) are obtained utilizing NASCET criteria, using the distal internal carotid diameter as the denominator. RADIATION DOSE REDUCTION: This exam was performed according to the departmental dose-optimization program which includes automated exposure control, adjustment of the mA and/or kV according to patient size and/or use of iterative reconstruction technique. CONTRAST:  122m OMNIPAQUE IOHEXOL 350 MG/ML SOLN COMPARISON:  02/16/2022 FINDINGS: CT HEAD FINDINGS For noncontrast findings,  please see same day CT head. CTA NECK FINDINGS Aortic arch: Standard branching. Imaged portion shows no evidence of aneurysm or dissection. No significant stenosis of the major arch vessel origins. Right carotid system: No evidence of dissection, occlusion, or hemodynamically significant stenosis (greater than 50%). Left carotid system: No evidence of dissection, occlusion, or hemodynamically significant stenosis (greater than 50%). Vertebral arteries: No evidence of dissection, occlusion, or hemodynamically significant stenosis (greater than 50%). Skeleton: Degenerative changes in the cervical spine. Edentulous. No acute osseous abnormality. Other neck: No acute finding. Upper chest: Centrilobular and paraseptal emphysema. No focal pulmonary opacity or pleural effusion. Review of the MIP images confirms the above findings CTA HEAD FINDINGS Anterior circulation: Both internal carotid arteries are patent to the termini, with calcified and noncalcified plaque in the right cavernous carotid causing at least 50% stenosis similar to the prior exam. Hypoplastic or aplastic right A1. Patent left A1. Normal anterior communicating artery. Anterior cerebral arteries are patent to their distal aspects. No M1 stenosis or occlusion. Normal MCA bifurcations. Distal MCA branches perfused and symmetric. Posterior  circulation: Vertebral arteries patent to the vertebrobasilar junction, with redemonstrated mild-to-moderate focal stenosis in the left V4 segment. Left dominant system. Posterior inferior cerebellar arteries patent proximally. Basilar patent to its distal aspect. Superior cerebellar arteries patent proximally. Patent P1 segments. PCAs perfused to their distal aspects without stenosis. The bilateral posterior communicating arteries are not visualized. Venous sinuses: As permitted by contrast timing, patent. Anatomic variants: None significant. Review of the MIP images confirms the above findings IMPRESSION: 1. No intracranial large vessel occlusion or new stenosis. Unchanged right cavernous carotid and left V4 segment stenosis. 2.  No hemodynamically significant stenosis in the neck. 3.  Emphysema (ICD10-J43.9). Electronically Signed   By: Merilyn Baba M.D.   On: 02/19/2022 18:53   MR BRAIN WO CONTRAST  Result Date: 02/19/2022 CLINICAL DATA:  Neuro deficit, acute, stroke suspected EXAM: MRI HEAD WITHOUT CONTRAST TECHNIQUE: Multiplanar, multiecho pulse sequences of the brain and surrounding structures were obtained without intravenous contrast. COMPARISON:  02/16/2022 FINDINGS: Brain: New small focus of reduced diffusion at the right pontomedullary junction. There is also some involvement more posterosuperiorly along the floor of the fourth ventricle. No evidence of intracranial hemorrhage. Ventricles and sulci are stable in size and configuration. Unchanged patchy small foci of T2 hyperintensity in the supratentorial white matter that may reflect minor chronic microvascular ischemic changes. There is no intracranial mass or mass effect. No hydrocephalus or extra-axial collection. Vascular: Major vessel flow voids at the skull base are preserved. Skull and upper cervical spine: Normal marrow signal is preserved. As noted on the prior study, a disc osteophyte complex at C3-C4 may cause significant canal narrowing.  Sinuses/Orbits: Paranasal sinuses are aerated. Orbits are unremarkable. Other: Sella is expanded and "empty."  Mastoid air cells are clear. 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 Additional smaller acute infarcts more dorsally and superiorly along the floor of the fourth ventricle. Electronically Signed   By: Macy Mis M.D.   On: 02/19/2022 14:39   CT Head Wo Contrast  Result Date: 02/19/2022 CLINICAL DATA:  Weakness. EXAM: CT HEAD WITHOUT CONTRAST TECHNIQUE: Contiguous axial images were obtained from the base of the skull through the vertex without intravenous contrast. RADIATION DOSE REDUCTION: This exam was performed according to the departmental dose-optimization program which includes automated exposure control, adjustment of the mA and/or kV according to patient size and/or use of iterative reconstruction technique. COMPARISON:  MRI of the brain on  02/16/2022 FINDINGS: Brain: The brain demonstrates no evidence of hemorrhage, infarction, edema, mass effect, extra-axial fluid collection, hydrocephalus or mass lesion. Vascular: No hyperdense vessel or unexpected calcification. Skull: Normal. Negative for fracture or focal lesion. Sinuses/Orbits: No acute finding. Other: None. IMPRESSION: No acute findings by CT of the head without contrast. Electronically Signed   By: Aletta Edouard M.D.   On: 02/19/2022 10:49     LOS: 1 day   Oren Binet, MD  [facility]    To contact the attending provider between 7A-7P or the covering provider during after hours 7P-7A, please log into the web site [email or web address] and access using universal [place] password for that web site. If you do not have the password, please call the hospital operator.  02/21/2022, 10:23 AM

## 2022-02-21 NOTE — Plan of Care (Signed)

## 2022-02-21 NOTE — Progress Notes (Signed)
New stroke, likely small vessel disease. Discharge tomorrow.  Will arrange outpatient consultation to address risk factors.   Nigel Mormon, MD Pager: [phone] Office: [phone]

## 2022-02-21 NOTE — Progress Notes (Signed)
Physical Therapy Treatment Patient Details Name: Kaylee Murphy MRN: [id number] DOB: [birth year] Today's Date: 02/21/2022   History of Present Illness 61 y.o. female  who presented to ED with complaints of diplopia and left sided weakness that started on 02/15/22. Seen at [facility] with negative brain MRI and CTA head/neck. 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 Able to suppress vertigo with visual fixation on a target 3-4 ft away, which allows her to look at the floor when walking and suppress vertigo. If she looks straight ahead, the vertigo persists. Educated to work on fixating vision on targets near and farther away and to try not to close her eyes to suppress vertigo. Patient verbalized understanding. Patient has 1 flight of stairs up to her bedroom and son will not be back from out of town until Monday 6/12.    Recommendations for follow up therapy are one component of a multi-disciplinary discharge planning process, led by the attending physician.  Recommendations may be updated based on patient status, additional functional criteria and insurance authorization.  Follow Up Recommendations  Outpatient PT     Assistance Recommended at Discharge Set up Supervision/Assistance  Patient can return home with the following Help with stairs or ramp for entrance   Equipment Recommendations  None recommended by PT (might benefit from cane; could purchase if needed)    Recommendations for Other Services       Precautions / Restrictions Precautions Precautions: Fall Restrictions Weight Bearing Restrictions: No     Mobility  Bed Mobility Overal bed mobility: Independent                  Transfers Overall transfer level: Needs assistance Equipment used: Straight cane Transfers: Sit to/from  Stand Sit to Stand: Supervision           General transfer comment: for safety due to vertigo; no imbalance;    Ambulation/Gait Ambulation/Gait assistance: Min guard Gait Distance (Feet): 110 Feet Assistive device: Straight cane (lightly touching counter and foot board when accessible) Gait Pattern/deviations: Decreased stride length, Wide base of support, Step-to pattern Gait velocity: decr, cautious     General Gait Details: pt cautious with wider than normal stance; vc for safe, proper use of cane   Stairs             Wheelchair Mobility    Modified Rankin (Stroke Patients Only) Modified Rankin (Stroke Patients Only) Pre-Morbid Rankin Score: No symptoms Modified Rankin: Moderately severe disability     Balance Overall balance assessment: Needs assistance Sitting-balance support: No upper extremity supported, Feet supported Sitting balance-Leahy Scale: Good     Standing balance support: No upper extremity supported Standing balance-Leahy Scale: Fair Standing balance comment: having vertigo, denies diplopia                            Cognition Arousal/Alertness: Awake/alert Behavior During Therapy: WFL for tasks assessed/performed Overall Cognitive Status: Within Functional Limits for tasks assessed                                          Exercises      General Comments General comments (skin integrity, edema, etc.): pt  reports can focus on item 3-4 feet away and get vertigo to subside; when walking instructed to look down to floor and pt reports vertigo stops; educated on not closing her eyes to stop vertigo, but to instead try to get eyes focused on a target and suppress vertigo      Pertinent Vitals/Pain      Home Living                          Prior Function            PT Goals (current goals can now be found in the care plan section) Acute Rehab PT Goals Patient Stated Goal: to get back to her  normal self PT Goal Formulation: With patient Time For Goal Achievement: 03/06/22 Potential to Achieve Goals: Good Progress towards PT goals: Progressing toward goals    Frequency    Min 4X/week      PT Plan Current plan remains appropriate    Co-evaluation              AM-PAC PT "6 Clicks" Mobility   Outcome Measure  Help needed turning from your back to your side while in a flat bed without using bedrails?: None Help needed moving from lying on your back to sitting on the side of a flat bed without using bedrails?: None Help needed moving to and from a bed to a chair (including a wheelchair)?: A Little Help needed standing up from a chair using your arms (e.g., wheelchair or bedside chair)?: A Little Help needed to walk in hospital room?: A Little Help needed climbing 3-5 steps with a railing? : A Little 6 Click Score: 20    End of Session Equipment Utilized During Treatment: Gait belt Activity Tolerance: Patient tolerated treatment well Patient left: in chair;with call bell/phone within reach Nurse Communication: Mobility status;Other (comment) (will not have family at home today for discharge) PT Visit Diagnosis: Unsteadiness on feet (R26.81);Dizziness and giddiness (R42)     Time: 2010-0712 PT Time Calculation (min) (ACUTE ONLY): 17 min  Charges:  $Gait Training: 8-22 mins                      [place]  Office [phone]    Rexanne Mano 02/21/2022, 8:55 AM

## 2022-02-22 ENCOUNTER — Other Ambulatory Visit (HOSPITAL_COMMUNITY): Payer: Self-pay

## 2022-02-22 LAB — GLUCOSE, CAPILLARY: Glucose-Capillary: 103 mg/dL — ABNORMAL HIGH (ref 70–99)

## 2022-02-22 MED ORDER — GLUCOSE BLOOD VI STRP
ORAL_STRIP | 0 refills | Status: AC
Start: 2022-02-22 — End: ?
  Filled 2022-02-22: qty 100, 25d supply, fill #0

## 2022-02-22 MED ORDER — ROSUVASTATIN CALCIUM 20 MG PO TABS
20.0000 mg | ORAL_TABLET | Freq: Every day | ORAL | 3 refills | Status: AC
  Filled 2022-02-22: qty 30, 30d supply, fill #0

## 2022-02-22 MED ORDER — ACCU-CHEK GUIDE W/DEVICE KIT
PACK | 0 refills | Status: AC
  Filled 2022-02-22: qty 1, 30d supply, fill #0

## 2022-02-22 MED ORDER — METFORMIN HCL 500 MG PO TABS
500.0000 mg | ORAL_TABLET | Freq: Two times a day (BID) | ORAL | 3 refills | Status: AC
  Filled 2022-02-22: qty 60, 30d supply, fill #0

## 2022-02-22 MED ORDER — ACCU-CHEK FASTCLIX LANCETS MISC
0 refills | Status: AC
Start: 2022-02-22 — End: ?
  Filled 2022-02-22: qty 102, 25d supply, fill #0

## 2022-02-22 MED ORDER — ASPIRIN 81 MG PO TBEC
81.0000 mg | DELAYED_RELEASE_TABLET | Freq: Every day | ORAL | 12 refills | Status: AC
  Filled 2022-02-22: qty 30, 30d supply, fill #0

## 2022-02-22 MED ORDER — CLOPIDOGREL BISULFATE 75 MG PO TABS
75.0000 mg | ORAL_TABLET | Freq: Every day | ORAL | 0 refills | Status: AC
  Filled 2022-02-22: qty 21, 21d supply, fill #0

## 2022-02-22 NOTE — Progress Notes (Signed)
Physical Therapy Treatment Patient Details Name: Kaylee Murphy MRN: [id number] DOB: Dec 06, 1960 Today's Date: 02/22/2022   History of Present Illness 61 y.o. female  who presented to ED with complaints of diplopia and left sided weakness that started on 02/15/22. Seen at [facility] with negative brain MRI and CTA head/neck. 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 She is safely ambulating with cane household distances and is okay for discharge from PT perspective.     Recommendations for follow up therapy are one component of a multi-disciplinary discharge planning process, led by the attending physician.  Recommendations may be updated based on patient status, additional functional criteria and insurance authorization.  Follow Up Recommendations  Outpatient PT     Assistance Recommended at Discharge Set up Supervision/Assistance  Patient can return home with the following Help with stairs or ramp for entrance   Equipment Recommendations  None recommended by PT (might benefit from cane; could purchase if needed)    Recommendations for Other Services       Precautions / Restrictions Precautions Precautions: Fall Restrictions Weight Bearing Restrictions: No     Mobility  Bed Mobility Overal bed mobility: Independent             General bed mobility comments: up in chair upon PT arrival    Transfers Overall transfer level: Needs assistance Equipment used: Straight cane Transfers: Sit to/from Stand Sit to Stand: Modified independent (Device/Increase time)                Ambulation/Gait Ambulation/Gait assistance: Supervision Gait Distance (Feet): 110  Feet Assistive device: Straight cane (lightly touching counter and foot board when accessible) Gait Pattern/deviations: Decreased stride length, Wide base of support, Step-to pattern Gait velocity: decr, cautious     General Gait Details: pt cautious   Stairs             Wheelchair Mobility    Modified Rankin (Stroke Patients Only) Modified Rankin (Stroke Patients Only) Pre-Morbid Rankin Score: No symptoms Modified Rankin: Moderately severe disability     Balance Overall balance assessment: Needs assistance Sitting-balance support: No upper extremity supported, Feet supported Sitting balance-Leahy Scale: Good     Standing balance support: No upper extremity supported Standing balance-Leahy Scale: Fair Standing balance comment: having vertigo                            Cognition Arousal/Alertness: Awake/alert Behavior During Therapy: WFL for tasks assessed/performed Overall Cognitive Status: Within Functional Limits for tasks assessed                                          Exercises Other Exercises Other Exercises: Pt able to visually fixate on target at ~40" (if target farther away, pt experiences vertigo). Educated to hang simple target ("A") on the wall and in sitting, slowly move chair farther away and work on fixating to stop target from moving. Other Exercises: Attempted visual saccades and pt with diplopia when looking to the right. Must move target almost to midline to be able to  focus it into one target. Educated to work on spreading targets farther apart and move eyes from left to right target with focus until object appears to be a single target.    General Comments        Pertinent Vitals/Pain Pain Assessment Pain Assessment: No/denies pain    Home Living Family/patient expects to be discharged to:: Private residence Living Arrangements: Other relatives (son and daughter in law) Available Help at Discharge:  Family;Available 24 hours/day Type of Home: House Home Access: Stairs to enter Entrance Stairs-Rails: None Entrance Stairs-Number of Steps: 1 Alternate Level Stairs-Number of Steps: flight Home Layout: Two level;Bed/bath upstairs Home Equipment: None      Prior Function            PT Goals (current goals can now be found in the care plan section) Acute Rehab PT Goals Patient Stated Goal: to get back to her normal self PT Goal Formulation: With patient Time For Goal Achievement: 03/06/22 Potential to Achieve Goals: Good Progress towards PT goals: Progressing toward goals    Frequency    Min 4X/week      PT Plan Current plan remains appropriate    Co-evaluation              AM-PAC PT "6 Clicks" Mobility   Outcome Measure  Help needed turning from your back to your side while in a flat bed without using bedrails?: None Help needed moving from lying on your back to sitting on the side of a flat bed without using bedrails?: None Help needed moving to and from a bed to a chair (including a wheelchair)?: A Little Help needed standing up from a chair using your arms (e.g., wheelchair or bedside chair)?: A Little Help needed to walk in hospital room?: A Little Help needed climbing 3-5 steps with a railing? : A Little 6 Click Score: 20    End of Session   Activity Tolerance: Patient tolerated treatment well Patient left: in chair;with call bell/phone within reach   PT Visit Diagnosis: Unsteadiness on feet (R26.81);Dizziness and giddiness (R42)     Time: 8768-1157 PT Time Calculation (min) (ACUTE ONLY): 18 min  Charges:  $Therapeutic Exercise: 8-22 mins                      [place]  Office [phone] ]   Rexanne Mano 02/22/2022, 9:38 AM

## 2022-02-22 NOTE — Discharge Summary (Signed)
PATIENT DETAILS Name: Kaylee Murphy Age: 61 y.o. Sex: female Date of Birth: [birth year] MRN: [id number]. Admitting Physician: Jonetta Osgood, MD PCP:Pcp, No  Admit Date: 02/19/2022 Discharge date: 02/22/2022  Recommendations for Outpatient Follow-up:  Follow up with PCP in 1-2 weeks Please obtain CMP/CBC in one week Please ensure follow-up with cardiology and neurology.   Admitted From:  Home  Disposition: Home   Discharge Condition: good  CODE STATUS:   Code Status: Full Code   Diet recommendation:  Diet Order             Diet - low sodium heart healthy           Diet Carb Modified           Diet Carb Modified Fluid consistency: Thin; Room service appropriate? Yes  Diet effective now                    Brief Summary: Patient is a 61 y.o.  female with history of DM-2, HTN, HLD, tobacco use, chronic pain on methadone-presented with diplopia, left facial droop and left-sided weakness.  She was subsequently found to have acute CVA and admitted to the hospitalist service.   Significant events: 6/06> to [facility] ED with diplopia-MRI brain negative-diplopia resolved-discharged home. 6/09>> to [facility] ED with diplopia, left facial droop/left-sided weakness-found to have CVA-admit to [facility].   Significant studies: 6/06>> CTA head/neck: 50% stenosis in cervical/intracranial right cavernous ICA 6/06>> MRI brain: No acute CVA 6/06>> MRI orbit: No acute abnormalities 6/09>> MRI brain: Acute infarct right pontomedullary junction, smaller acute infarcts dorsally/superiorly along the floor of the fourth ventricle. 6/09>> CTA head/neck: Unchanged right cavernous carotid/left V4 segment stenosis-no hemodynamically significant stenosis in the neck. 6/09>> A1c: 6.8 6/10>> LDL: 103 6/10>> TTE: EF 45-50%, with numerous regional wall motion abnormalities.   Significant microbiology data: 6/09>> COVID/influenza PCR: Negative   Procedures: None   Consults: Neurology  Brief  Hospital Course: Acute CVA: Felt to be secondary to small vessel disease-work-up as above-some mild LLE weakness-left facial droop persists.  Recommendations from neurology are to continue aspirin/Plavix x3 weeks followed by ASA alone.  On high intensity statin.  Will need outpatient follow-up with neurology.  Recommendations by PT for outpatient PT.    Newly diagnosed chronic HFrEF with wall motion abnormality: Incidentally seen on echo-volume status is stable-discussed with cardiologist-Dr. Patwardhan-outpatient work-up will be arranged.   HTN: Initially permissive hypertension was allowed-resuming amlodipine on discharge.  HLD: Continue statin  DM-2: CBG stable with SSI while inpatient-discussed with patient-she is agreeable to being started with metformin on discharge.  Apparently she has taken metformin in the past.  Chronic pain syndrome: Continue methadone.  Dose verified by pharmacy..   Mild leukocytosis: Likely of no clinical significance-follow periodically-has almost normalized.   Tobacco abuse: Counseled-continue transdermal nicotine   BMI: Estimated body mass index is 25.86 kg/m as calculated from the following:   Height as of 02/16/22: 5' 5"  (1.651 m).   Weight as of 02/16/22: 70.5 kg.     Discharge Diagnoses:  Principal Problem:   Acute CVA (cerebrovascular accident) [facility]) Active Problems:   Essential hypertension, benign   Hyperlipidemia   Leukocytosis   Controlled type 2 diabetes mellitus without complication, without long-term current use of insulin (HCC)   Chronic shoulder pain   Iron deficiency anemia   Tobacco abuse   Discharge Instructions:  Activity:  As tolerated   Discharge Instructions     Ambulatory referral to Neurology   Complete by:  As directed    An appointment is requested in approximately: 4 weeks   Ambulatory referral to Occupational Therapy   Complete by: As directed    Ambulatory referral to Physical Therapy   Complete by: As directed     Vestibular therapy   Ambulatory referral to Speech Therapy   Complete by: As directed    Diet - low sodium heart healthy   Complete by: As directed    Diet Carb Modified   Complete by: As directed    Discharge instructions   Complete by: As directed    Follow with Primary MD  in 1-2 weeks  Aspirin and Plavix x3 weeks-after 3 weeks stop Plavix and continue aspirin.  You have also been started on a cholesterol medication for high cholesterol now that you have had a CVA.  You will be scheduled for an outpatient follow-up with the stroke clinic-they will call you with an appointment-if you do not hear from them-please give them a call.    You will be scheduled for a outpatient follow-up with cardiology-they will give you a call with an appointment-if you do not hear from them-please give them a call.  Stop smoking  Check your blood glucose levels 2-3 times a day-keep a record of these readings and take it to your next appointment with your primary care practitioner.  Please get a complete blood count and chemistry panel checked by your Primary MD at your next visit, and again as instructed by your Primary MD.  Get Medicines reviewed and adjusted: Please take all your medications with you for your next visit with your Primary MD  Laboratory/radiological data: Please request your Primary MD to go over all hospital tests and procedure/radiological results at the follow up, please ask your Primary MD to get all Hospital records sent to his/her office.  In some cases, they will be blood work, cultures and biopsy results pending at the time of your discharge. Please request that your primary care M.D. follows up on these results.  Also Note the following: If you experience worsening of your admission symptoms, develop shortness of breath, life threatening emergency, suicidal or homicidal thoughts you must seek medical attention immediately by calling 911 or calling your MD immediately  if  symptoms less severe.  You must read complete instructions/literature along with all the possible adverse reactions/side effects for all the Medicines you take and that have been prescribed to you. Take any new Medicines after you have completely understood and accpet all the possible adverse reactions/side effects.   Do not drive when taking Pain medications or sleeping medications (Benzodaizepines)  Do not take more than prescribed Pain, Sleep and Anxiety Medications. It is not advisable to combine anxiety,sleep and pain medications without talking with your primary care practitioner  Special Instructions: If you have smoked or chewed Tobacco  in the last 2 yrs please stop smoking, stop any regular Alcohol  and or any Recreational drug use.  Wear Seat belts while driving.  Please note: You were cared for by a hospitalist during your hospital stay. Once you are discharged, your primary care physician will handle any further medical issues. Please note that NO REFILLS for any discharge medications will be authorized once you are discharged, as it is imperative that you return to your primary care physician (or establish a relationship with a primary care physician if you do not have one) for your post hospital discharge needs so that they can reassess your need for medications and monitor  your lab values.   Increase activity slowly   Complete by: As directed       Allergies as of 02/22/2022       Reactions   Cephalexin Swelling   REACTION: tongue swells and becomes disoriented   Aspirin Nausea And Vomiting   Codeine Itching   Ketorolac Tromethamine Nausea And Vomiting   Morphine And Related Other (See Comments)   headache        Medication List     TAKE these medications    aspirin EC 81 MG tablet Take 1 tablet (81 mg total) by mouth daily. Swallow whole.   atenolol 50 MG tablet Commonly known as: TENORMIN Take 50 mg by mouth every morning.   blood glucose meter kit and  supplies Dispense based on patient and insurance preference. Use up to four times daily as directed. (FOR ICD-10 E10.9, E11.9).   clopidogrel 75 MG tablet Commonly known as: PLAVIX Take 1 tablet (75 mg total) by mouth daily. Take along with aspirin for 21 days-after 21 days stop Plavix and continue on aspirin alone.   gabapentin 400 MG capsule Commonly known as: NEURONTIN Take 400 mg by mouth 2 (two) times daily.   ibuprofen 200 MG tablet Commonly known as: ADVIL Take 400 mg by mouth 2 (two) times daily as needed (pain).   metFORMIN 500 MG tablet Commonly known as: Glucophage Take 1 tablet (500 mg total) by mouth 2 (two) times daily with a meal.   METHADONE HCL PO Take 80 mg by mouth every morning.   rosuvastatin 20 MG tablet Commonly known as: CRESTOR Take 1 tablet (20 mg total) by mouth daily.        Follow-up Information     Patwardhan, Reynold Bowen, MD. Call.   Specialties: Cardiology, Radiology Contact information: [place] [address] [place] Follow up.   Specialty: Rehabilitation Why: For outpatient vestibular PT, OT, SLP Contact information: [address] [place] [id number] [place]        Garvin Fila, MD Follow up.   Specialties: Neurology, Radiology Why: Office will call with date/time, If you dont hear from them,please give them a call Contact information: [address]  [place] [address] [phone]                Allergies  Allergen Reactions   Cephalexin Swelling    REACTION: tongue swells and becomes disoriented   Aspirin Nausea And Vomiting   Codeine Itching   Ketorolac Tromethamine Nausea And Vomiting   Morphine And Related Other (See Comments)    headache     Other Procedures/Studies: ECHOCARDIOGRAM COMPLETE  Result Date: 02/20/2022    ECHOCARDIOGRAM REPORT    Patient Name:   Kaylee Murphy Date of Exam: 02/20/2022 Medical Rec #:  [id number]       Height:       65.0 in Accession #:    [id number]      Weight:       155.4 lb Date of Birth:  1960-10-16       BSA:          1.777 m Patient Age:    61 years        BP:           159/78 mmHg Patient Gender: F               HR:  48 bpm. Exam Location:  Inpatient Procedure: 2D Echo, Cardiac Doppler and Color Doppler Indications:    Stroke  History:        Patient has no prior history of Echocardiogram examinations.                 Risk Factors:Current Smoker, Hypertension and Diabetes.  Sonographer:    Clayton Lefort RDCS (AE) Referring Phys: [id number] [place]  1. Hypokinesis of the inferior and inferolateral wall with overall mild LV dysfunction.  2. Left ventricular ejection fraction, by estimation, is 45 to 50%. The left ventricle has mildly decreased function. The left ventricle demonstrates regional wall motion abnormalities (see scoring diagram/findings for description). The left ventricular  internal cavity size was mildly dilated. There is mild left ventricular hypertrophy. Left ventricular diastolic parameters are consistent with Grade I diastolic dysfunction (impaired relaxation). Elevated left atrial pressure.  3. Right ventricular systolic function is normal. The right ventricular size is normal.  4. The mitral valve is normal in structure. Trivial mitral valve regurgitation. No evidence of mitral stenosis.  5. The aortic valve is tricuspid. Aortic valve regurgitation is not visualized. Aortic valve sclerosis/calcification is present, without any evidence of aortic stenosis.  6. The inferior vena cava is normal in size with greater than 50% respiratory variability, suggesting right atrial pressure of 3 mmHg. FINDINGS  Left Ventricle: Left ventricular ejection fraction, by estimation, is 45 to 50%. The left ventricle has mildly decreased function. The left ventricle demonstrates regional wall motion  abnormalities. The left ventricular internal cavity size was mildly dilated. There is mild left ventricular hypertrophy. Left ventricular diastolic parameters are consistent with Grade I diastolic dysfunction (impaired relaxation). Elevated left atrial pressure. Right Ventricle: The right ventricular size is normal. Right ventricular systolic function is normal. Left Atrium: Left atrial size was normal in size. Right Atrium: Right atrial size was normal in size. Pericardium: There is no evidence of pericardial effusion. Mitral Valve: The mitral valve is normal in structure. Trivial mitral valve regurgitation. No evidence of mitral valve stenosis. Tricuspid Valve: The tricuspid valve is normal in structure. Tricuspid valve regurgitation is trivial. No evidence of tricuspid stenosis. Aortic Valve: The aortic valve is tricuspid. Aortic valve regurgitation is not visualized. Aortic valve sclerosis/calcification is present, without any evidence of aortic stenosis. Aortic valve mean gradient measures 5.0 mmHg. Aortic valve peak gradient measures 8.6 mmHg. Pulmonic Valve: The pulmonic valve was normal in structure. Pulmonic valve regurgitation is not visualized. No evidence of pulmonic stenosis. Aorta: The aortic root is normal in size and structure. Venous: The inferior vena cava is normal in size with greater than 50% respiratory variability, suggesting right atrial pressure of 3 mmHg. IAS/Shunts: No atrial level shunt detected by color flow Doppler. Additional Comments: Hypokinesis of the inferior and inferolateral wall with overall mild LV dysfunction.  LEFT VENTRICLE PLAX 2D LVIDd:         5.30 cm      Diastology LVIDs:         3.90 cm      LV e' medial:    4.03 cm/s LV PW:         1.20 cm      LV E/e' medial:  15.5 LV IVS:        1.20 cm      LV e' lateral:   4.90 cm/s LVOT diam:     2.30 cm      LV E/e' lateral: 12.7 LV SV:  67 LV SV Index:   38 LVOT Area:     4.15 cm  LV Volumes (MOD) LV vol d, MOD A2C:  117.0 ml LV vol d, MOD A4C: 110.0 ml LV vol s, MOD A2C: 56.0 ml LV vol s, MOD A4C: 60.8 ml LV SV MOD A2C:     61.0 ml LV SV MOD A4C:     110.0 ml LV SV MOD BP:      54.9 ml RIGHT VENTRICLE          IVC RV Basal diam:  3.30 cm  IVC diam: 1.70 cm TAPSE (M-mode): 2.8 cm LEFT ATRIUM             Index        RIGHT ATRIUM           Index LA diam:        3.70 cm 2.08 cm/m   RA Area:     15.20 cm LA Vol (A2C):   49.6 ml 27.91 ml/m  RA Volume:   39.60 ml  22.28 ml/m LA Vol (A4C):   34.6 ml 19.47 ml/m LA Biplane Vol: 43.3 ml 24.37 ml/m  AORTIC VALVE AV Area (Vmax):    2.00 cm AV Area (Vmean):   1.88 cm AV Area (VTI):     1.83 cm AV Vmax:           147.00 cm/s AV Vmean:          97.100 cm/s AV VTI:            0.366 m AV Peak Grad:      8.6 mmHg AV Mean Grad:      5.0 mmHg LVOT Vmax:         70.60 cm/s LVOT Vmean:        43.900 cm/s LVOT VTI:          0.161 m LVOT/AV VTI ratio: 0.44  AORTA Ao Root diam: 3.10 cm Ao Asc diam:  2.90 cm MITRAL VALVE MV Area (PHT): 1.52 cm    SHUNTS MV Decel Time: 500 msec    Systemic VTI:  0.16 m MV E velocity: 62.30 cm/s  Systemic Diam: 2.30 cm MV A velocity: 65.50 cm/s MV E/A ratio:  0.95 Kirk Ruths MD Electronically signed by Kirk Ruths MD Signature Date/Time: 02/20/2022/2:59:06 PM    Final    CT ANGIO HEAD NECK W WO CM  Result Date: 02/19/2022 CLINICAL DATA:  Generalized weakness, stroke-like symptoms; acute infarcts on same-day MRI EXAM: CT ANGIOGRAPHY HEAD AND NECK TECHNIQUE: Multidetector CT imaging of the head and neck was performed using the standard protocol during bolus administration of intravenous contrast. Multiplanar CT image reconstructions and MIPs were obtained to evaluate the vascular anatomy. Carotid stenosis measurements (when applicable) are obtained utilizing NASCET criteria, using the distal internal carotid diameter as the denominator. RADIATION DOSE REDUCTION: This exam was performed according to the departmental dose-optimization program which includes  automated exposure control, adjustment of the mA and/or kV according to patient size and/or use of iterative reconstruction technique. CONTRAST:  170m OMNIPAQUE IOHEXOL 350 MG/ML SOLN COMPARISON:  02/16/2022 FINDINGS: CT HEAD FINDINGS For noncontrast findings, please see same day CT head. CTA NECK FINDINGS Aortic arch: Standard branching. Imaged portion shows no evidence of aneurysm or dissection. No significant stenosis of the major arch vessel origins. Right carotid system: No evidence of dissection, occlusion, or hemodynamically significant stenosis (greater than 50%). Left carotid system: No evidence of dissection, occlusion, or hemodynamically significant stenosis (greater than  50%). Vertebral arteries: No evidence of dissection, occlusion, or hemodynamically significant stenosis (greater than 50%). Skeleton: Degenerative changes in the cervical spine. Edentulous. No acute osseous abnormality. Other neck: No acute finding. Upper chest: Centrilobular and paraseptal emphysema. No focal pulmonary opacity or pleural effusion. Review of the MIP images confirms the above findings CTA HEAD FINDINGS Anterior circulation: Both internal carotid arteries are patent to the termini, with calcified and noncalcified plaque in the right cavernous carotid causing at least 50% stenosis similar to the prior exam. Hypoplastic or aplastic right A1. Patent left A1. Normal anterior communicating artery. Anterior cerebral arteries are patent to their distal aspects. No M1 stenosis or occlusion. Normal MCA bifurcations. Distal MCA branches perfused and symmetric. Posterior circulation: Vertebral arteries patent to the vertebrobasilar junction, with redemonstrated mild-to-moderate focal stenosis in the left V4 segment. Left dominant system. Posterior inferior cerebellar arteries patent proximally. Basilar patent to its distal aspect. Superior cerebellar arteries patent proximally. Patent P1 segments. PCAs perfused to their distal  aspects without stenosis. The bilateral posterior communicating arteries are not visualized. Venous sinuses: As permitted by contrast timing, patent. Anatomic variants: None significant. Review of the MIP images confirms the above findings IMPRESSION: 1. No intracranial large vessel occlusion or new stenosis. Unchanged right cavernous carotid and left V4 segment stenosis. 2.  No hemodynamically significant stenosis in the neck. 3.  Emphysema (ICD10-J43.9). Electronically Signed   By: Merilyn Baba M.D.   On: 02/19/2022 18:53   MR BRAIN WO CONTRAST  Result Date: 02/19/2022 CLINICAL DATA:  Neuro deficit, acute, stroke suspected EXAM: MRI HEAD WITHOUT CONTRAST TECHNIQUE: Multiplanar, multiecho pulse sequences of the brain and surrounding structures were obtained without intravenous contrast. COMPARISON:  02/16/2022 FINDINGS: Brain: New small focus of reduced diffusion at the right pontomedullary junction. There is also some involvement more posterosuperiorly along the floor of the fourth ventricle. No evidence of intracranial hemorrhage. Ventricles and sulci are stable in size and configuration. Unchanged patchy small foci of T2 hyperintensity in the supratentorial white matter that may reflect minor chronic microvascular ischemic changes. There is no intracranial mass or mass effect. No hydrocephalus or extra-axial collection. Vascular: Major vessel flow voids at the skull base are preserved. Skull and upper cervical spine: Normal marrow signal is preserved. As noted on the prior study, a disc osteophyte complex at C3-C4 may cause significant canal narrowing. Sinuses/Orbits: Paranasal sinuses are aerated. Orbits are unremarkable. Other: Sella is expanded and "empty."  Mastoid air cells are clear. 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 Additional smaller acute infarcts more dorsally and superiorly along the floor of the fourth ventricle. Electronically Signed   By: Macy Mis M.D.    On: 02/19/2022 14:39   CT Head Wo Contrast  Result Date: 02/19/2022 CLINICAL DATA:  Weakness. EXAM: CT HEAD WITHOUT CONTRAST TECHNIQUE: Contiguous axial images were obtained from the base of the skull through the vertex without intravenous contrast. RADIATION DOSE REDUCTION: This exam was performed according to the departmental dose-optimization program which includes automated exposure control, adjustment of the mA and/or kV according to patient size and/or use of iterative reconstruction technique. COMPARISON:  MRI of the brain on 02/16/2022 FINDINGS: Brain: The brain demonstrates no evidence of hemorrhage, infarction, edema, mass effect, extra-axial fluid collection, hydrocephalus or mass lesion. Vascular: No hyperdense vessel or unexpected calcification. Skull: Normal. Negative for fracture or focal lesion. Sinuses/Orbits: No acute finding. Other: None. IMPRESSION: No acute findings by CT of the head without contrast. Electronically Signed   By: Eulas Post  Kathlene Cote M.D.   On: 02/19/2022 10:49   MR BRAIN WO CONTRAST  Result Date: 02/16/2022 CLINICAL DATA:  Neuro deficit, acute, stroke suspected; Diplopia EXAM: MRI HEAD AND ORBITS WITHOUT CONTRAST TECHNIQUE: Multiplanar, multi-echo pulse sequences of the brain and surrounding structures were acquired without intravenous contrast. Multiplanar, multi-echo pulse sequences of the orbits and surrounding structures were acquired including fat saturation techniques, without intravenous contrast administration. COMPARISON:  None Available. FINDINGS: Motion artifact is present MRI HEAD FINDINGS Brain: No acute infarction or hemorrhage. Ventricles and sulci are normal in size and configuration. Patchy small foci of T2 hyperintensity in the supratentorial white matter are nonspecific but may reflect minor microvascular ischemic changes. There is no intracranial mass or mass effect. No hydrocephalus or extra-axial collection. Vascular: Major vessel flow voids at the skull  base are preserved. Skull and upper cervical spine: Marrow signal is within normal limits. Disc osteophyte complex at C3-C4 may cause marked canal stenosis. Other: Expanded, "empty" sella, probably incidental. MRI ORBITS FINDINGS Orbits: No proptosis. Globes, extraocular muscles, lacrimal glands, and optic nerve sheath complexes are symmetric and unremarkable within the limitation noted above. No intraorbital mass. Visualized sinuses: Trace mucosal thickening. Soft tissues: Unremarkable. IMPRESSION: No evidence of recent infarction, hemorrhage, or mass. Probable minor chronic microvascular ischemic changes. Disc osteophyte complex at C3-C4 may cause marked canal stenosis. Electronically Signed   By: Macy Mis M.D.   On: 02/16/2022 11:28   MR ORBITS WO CONTRAST  Result Date: 02/16/2022 CLINICAL DATA:  Neuro deficit, acute, stroke suspected; Diplopia EXAM: MRI HEAD AND ORBITS WITHOUT CONTRAST TECHNIQUE: Multiplanar, multi-echo pulse sequences of the brain and surrounding structures were acquired without intravenous contrast. Multiplanar, multi-echo pulse sequences of the orbits and surrounding structures were acquired including fat saturation techniques, without intravenous contrast administration. COMPARISON:  None Available. FINDINGS: Motion artifact is present MRI HEAD FINDINGS Brain: No acute infarction or hemorrhage. Ventricles and sulci are normal in size and configuration. Patchy small foci of T2 hyperintensity in the supratentorial white matter are nonspecific but may reflect minor microvascular ischemic changes. There is no intracranial mass or mass effect. No hydrocephalus or extra-axial collection. Vascular: Major vessel flow voids at the skull base are preserved. Skull and upper cervical spine: Marrow signal is within normal limits. Disc osteophyte complex at C3-C4 may cause marked canal stenosis. Other: Expanded, "empty" sella, probably incidental. MRI ORBITS FINDINGS Orbits: No proptosis. Globes,  extraocular muscles, lacrimal glands, and optic nerve sheath complexes are symmetric and unremarkable within the limitation noted above. No intraorbital mass. Visualized sinuses: Trace mucosal thickening. Soft tissues: Unremarkable. IMPRESSION: No evidence of recent infarction, hemorrhage, or mass. Probable minor chronic microvascular ischemic changes. Disc osteophyte complex at C3-C4 may cause marked canal stenosis. Electronically Signed   By: Macy Mis M.D.   On: 02/16/2022 11:28   CT ANGIO HEAD NECK W WO CM  Result Date: 02/16/2022 CLINICAL DATA:  Dizziness and blurred vision beginning while driving. EXAM: CT ANGIOGRAPHY HEAD AND NECK TECHNIQUE: Multidetector CT imaging of the head and neck was performed using the standard protocol during bolus administration of intravenous contrast. Multiplanar CT image reconstructions and MIPs were obtained to evaluate the vascular anatomy. Carotid stenosis measurements (when applicable) are obtained utilizing NASCET criteria, using the distal internal carotid diameter as the denominator. RADIATION DOSE REDUCTION: This exam was performed according to the departmental dose-optimization program which includes automated exposure control, adjustment of the mA and/or kV according to patient size and/or use of iterative reconstruction technique. CONTRAST:  130m OMNIPAQUE IOHEXOL  350 MG/ML SOLN COMPARISON:  11/29/2017 head CT FINDINGS: CT HEAD FINDINGS Brain: No evidence of acute infarction, hemorrhage, hydrocephalus, extra-axial collection or mass lesion/mass effect. Partially empty sella, nonspecific in isolation and stable since 2019. Vascular: See below Skull: Negative Sinuses: Negative Orbits: Negative Review of the MIP images confirms the above findings CTA NECK FINDINGS Aortic arch: Unremarkable Right carotid system: Mild low-density plaque at the bifurcation/bulb without stenosis or ulceration. ICA tortuosity. Left carotid system: Low-density plaque at the bifurcation  and ICA bulb. Mild ICA tortuosity. No stenosis or ulceration. Vertebral arteries: No proximal subclavian stenosis. The left vertebral artery is dominant. No vertebral stenosis or beading. Skeleton: Generalized cervical spine degeneration. Other neck: No acute or aggressive finding. Midline nasopharyngeal cyst. Upper chest: Centrilobular emphysema. Review of the MIP images confirms the above findings CTA HEAD FINDINGS Anterior circulation: At least 50% stenosis of the atheromatous right cavernous ICA. No branch occlusion, beading, or aneurysm. Aplastic right A1 segment. Posterior circulation: Left dominant vertebral artery. Mild atheromatous plaque at the left V4 segment causing 40% stenosis. No branch occlusion, beading, or aneurysm. Venous sinuses: Diffusely patent Anatomic variants: As above Review of the MIP images confirms the above findings IMPRESSION: 1. No emergent finding. 2. 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 3.  Emphysema (ICD10-J43.9). Electronically Signed   By: Jorje Guild M.D.   On: 02/16/2022 04:16     TODAY-DAY OF DISCHARGE:  Subjective:   Kaylee Murphy today has no headache,no chest abdominal pain,no new weakness tingling or numbness, feels much better wants to go home today.  Objective:   Blood pressure (!) 148/84, pulse 82, temperature 98.7 F (37.1 C), temperature source Oral, resp. rate 15, last menstrual period 02/13/2014, SpO2 100 %. No intake or output data in the 24 hours ending 02/22/22 0946 There were no vitals filed for this visit.  Exam: Awake Alert, Oriented *3, No new F.N deficits, Normal affect .AT,PERRAL Supple Neck,No JVD, No cervical lymphadenopathy appriciated.  Symmetrical Chest wall movement, Good air movement bilaterally, CTAB RRR,No Gallops,Rubs or new Murmurs, No Parasternal Heave +ve B.Sounds, Abd Soft, Non tender, No organomegaly appriciated, No rebound -guarding or rigidity. No  Cyanosis, Clubbing or edema, No new Rash or bruise   PERTINENT RADIOLOGIC STUDIES: ECHOCARDIOGRAM COMPLETE  Result Date: 02/20/2022    ECHOCARDIOGRAM REPORT   Patient Name:   Kaylee Murphy Date of Exam: 02/20/2022 Medical Rec #:  [id number]       Height:       65.0 in Accession #:    [id number]      Weight:       155.4 lb Date of Birth:  November 19, 1960       BSA:          1.777 m Patient Age:    42 years        BP:           159/78 mmHg Patient Gender: F               HR:           48 bpm. Exam Location:  Inpatient Procedure: 2D Echo, Cardiac Doppler and Color Doppler Indications:    Stroke  History:        Patient has no prior history of Echocardiogram examinations.                 Risk Factors:Current Smoker, Hypertension and Diabetes.  Sonographer:    Clayton Lefort RDCS (AE) Referring  Phys: [id number] ALLISON WOLFE IMPRESSIONS  1. Hypokinesis of the inferior and inferolateral wall with overall mild LV dysfunction.  2. Left ventricular ejection fraction, by estimation, is 45 to 50%. The left ventricle has mildly decreased function. The left ventricle demonstrates regional wall motion abnormalities (see scoring diagram/findings for description). The left ventricular  internal cavity size was mildly dilated. There is mild left ventricular hypertrophy. Left ventricular diastolic parameters are consistent with Grade I diastolic dysfunction (impaired relaxation). Elevated left atrial pressure.  3. Right ventricular systolic function is normal. The right ventricular size is normal.  4. The mitral valve is normal in structure. Trivial mitral valve regurgitation. No evidence of mitral stenosis.  5. The aortic valve is tricuspid. Aortic valve regurgitation is not visualized. Aortic valve sclerosis/calcification is present, without any evidence of aortic stenosis.  6. The inferior vena cava is normal in size with greater than 50% respiratory variability, suggesting right atrial pressure of 3 mmHg. FINDINGS  Left Ventricle: Left  ventricular ejection fraction, by estimation, is 45 to 50%. The left ventricle has mildly decreased function. The left ventricle demonstrates regional wall motion abnormalities. The left ventricular internal cavity size was mildly dilated. There is mild left ventricular hypertrophy. Left ventricular diastolic parameters are consistent with Grade I diastolic dysfunction (impaired relaxation). Elevated left atrial pressure. Right Ventricle: The right ventricular size is normal. Right ventricular systolic function is normal. Left Atrium: Left atrial size was normal in size. Right Atrium: Right atrial size was normal in size. Pericardium: There is no evidence of pericardial effusion. Mitral Valve: The mitral valve is normal in structure. Trivial mitral valve regurgitation. No evidence of mitral valve stenosis. Tricuspid Valve: The tricuspid valve is normal in structure. Tricuspid valve regurgitation is trivial. No evidence of tricuspid stenosis. Aortic Valve: The aortic valve is tricuspid. Aortic valve regurgitation is not visualized. Aortic valve sclerosis/calcification is present, without any evidence of aortic stenosis. Aortic valve mean gradient measures 5.0 mmHg. Aortic valve peak gradient measures 8.6 mmHg. Pulmonic Valve: The pulmonic valve was normal in structure. Pulmonic valve regurgitation is not visualized. No evidence of pulmonic stenosis. Aorta: The aortic root is normal in size and structure. Venous: The inferior vena cava is normal in size with greater than 50% respiratory variability, suggesting right atrial pressure of 3 mmHg. IAS/Shunts: No atrial level shunt detected by color flow Doppler. Additional Comments: Hypokinesis of the inferior and inferolateral wall with overall mild LV dysfunction.  LEFT VENTRICLE PLAX 2D LVIDd:         5.30 cm      Diastology LVIDs:         3.90 cm      LV e' medial:    4.03 cm/s LV PW:         1.20 cm      LV E/e' medial:  15.5 LV IVS:        1.20 cm      LV e' lateral:    4.90 cm/s LVOT diam:     2.30 cm      LV E/e' lateral: 12.7 LV SV:         67 LV SV Index:   38 LVOT Area:     4.15 cm  LV Volumes (MOD) LV vol d, MOD A2C: 117.0 ml LV vol d, MOD A4C: 110.0 ml LV vol s, MOD A2C: 56.0 ml LV vol s, MOD A4C: 60.8 ml LV SV MOD A2C:     61.0 ml LV SV MOD A4C:  110.0 ml LV SV MOD BP:      54.9 ml RIGHT VENTRICLE          IVC RV Basal diam:  3.30 cm  IVC diam: 1.70 cm TAPSE (M-mode): 2.8 cm LEFT ATRIUM             Index        RIGHT ATRIUM           Index LA diam:        3.70 cm 2.08 cm/m   RA Area:     15.20 cm LA Vol (A2C):   49.6 ml 27.91 ml/m  RA Volume:   39.60 ml  22.28 ml/m LA Vol (A4C):   34.6 ml 19.47 ml/m LA Biplane Vol: 43.3 ml 24.37 ml/m  AORTIC VALVE AV Area (Vmax):    2.00 cm AV Area (Vmean):   1.88 cm AV Area (VTI):     1.83 cm AV Vmax:           147.00 cm/s AV Vmean:          97.100 cm/s AV VTI:            0.366 m AV Peak Grad:      8.6 mmHg AV Mean Grad:      5.0 mmHg LVOT Vmax:         70.60 cm/s LVOT Vmean:        43.900 cm/s LVOT VTI:          0.161 m LVOT/AV VTI ratio: 0.44  AORTA Ao Root diam: 3.10 cm Ao Asc diam:  2.90 cm MITRAL VALVE MV Area (PHT): 1.52 cm    SHUNTS MV Decel Time: 500 msec    Systemic VTI:  0.16 m MV E velocity: 62.30 cm/s  Systemic Diam: 2.30 cm MV A velocity: 65.50 cm/s MV E/A ratio:  0.95 Kirk Ruths MD Electronically signed by Kirk Ruths MD Signature Date/Time: 02/20/2022/2:59:06 PM    Final      PERTINENT LAB RESULTS: CBC: Recent Labs    02/19/22 1010  WBC 11.6*  HGB 11.1*  HCT 36.5  PLT 316   CMET CMP     Component Value Date/Time   NA 136 02/19/2022 1010   K 3.5 02/19/2022 1010   CL 103 02/19/2022 1010   CO2 23 02/19/2022 1010   GLUCOSE 187 (H) 02/19/2022 1010   BUN 7 02/19/2022 1010   CREATININE 0.89 02/19/2022 1010   CREATININE 0.79 10/20/2011 0938   CALCIUM 8.8 (L) 02/19/2022 1010   PROT 7.1 02/19/2022 1010   ALBUMIN 3.4 (L) 02/19/2022 1010   AST 16 02/19/2022 1010   ALT 12 02/19/2022 1010    ALKPHOS 79 02/19/2022 1010   BILITOT 0.3 02/19/2022 1010   GFRNONAA >60 02/19/2022 1010   GFRNONAA 88 10/20/2011 0938   GFRAA >60 11/29/2017 1138   GFRAA >89 10/20/2011 0938    GFR Estimated Creatinine Clearance: 66.2 mL/min (by C-G formula based on SCr of 0.89 mg/dL). No results for input(s): "LIPASE", "AMYLASE" in the last 72 hours. No results for input(s): "CKTOTAL", "CKMB", "CKMBINDEX", "TROPONINI" in the last 72 hours. Invalid input(s): "POCBNP" No results for input(s): "DDIMER" in the last 72 hours. Recent Labs    02/19/22 1530  HGBA1C 6.8*   Recent Labs    02/20/22 0933  CHOL 174  HDL 30*  LDLCALC 103*  TRIG 205*  CHOLHDL 5.8   No results for input(s): "TSH", "T4TOTAL", "T3FREE", "THYROIDAB" in the last 72 hours.  Invalid input(s): "  FREET3" No results for input(s): "VITAMINB12", "FOLATE", "FERRITIN", "TIBC", "IRON", "RETICCTPCT" in the last 72 hours. Coags: Recent Labs    02/19/22 1435  INR 1.1   Microbiology: Recent Results (from the past 240 hour(s))  Resp Panel by RT-PCR (Flu A&B, Covid) Urine, Clean Catch     Status: None   Collection Time: 02/19/22  2:35 PM   Specimen: Urine, Clean Catch; Nasal Swab  Result Value Ref Range Status   SARS Coronavirus 2 by RT PCR NEGATIVE NEGATIVE Final    Comment: (NOTE) SARS-CoV-2 target nucleic acids are NOT DETECTED.  The SARS-CoV-2 RNA is generally detectable in upper respiratory specimens during the acute phase of infection. The lowest concentration of SARS-CoV-2 viral copies this assay can detect is 138 copies/mL. A negative result does not preclude SARS-Cov-2 infection and should not be used as the sole basis for treatment or other patient management decisions. A negative result may occur with  improper specimen collection/handling, submission of specimen other than nasopharyngeal swab, presence of viral mutation(s) within the areas targeted by this assay, and inadequate number of viral copies(<138  copies/mL). A negative result must be combined with clinical observations, patient history, and epidemiological information. The expected result is Negative.  Fact Sheet for Patients:  EntrepreneurPulse.com.au  Fact Sheet for Healthcare Providers:  IncredibleEmployment.be  This test is no t yet approved or cleared by the [place] FDA and  has been authorized for detection and/or diagnosis of SARS-CoV-2 by FDA under an Emergency Use Authorization (EUA). This EUA will remain  in effect (meaning this test can be used) for the duration of the COVID-19 declaration under Section 564(b)(1) of the Act, 21 U.S.C.section 360bbb-3(b)(1), unless the authorization is terminated  or revoked sooner.       Influenza A by PCR NEGATIVE NEGATIVE Final   Influenza B by PCR NEGATIVE NEGATIVE Final    Comment: (NOTE) The Xpert Xpress SARS-CoV-2/FLU/RSV plus assay is intended as an aid in the diagnosis of influenza from Nasopharyngeal swab specimens and should not be used as a sole basis for treatment. Nasal washings and aspirates are unacceptable for Xpert Xpress SARS-CoV-2/FLU/RSV testing.  Fact Sheet for Patients: EntrepreneurPulse.com.au  Fact Sheet for Healthcare Providers: IncredibleEmployment.be  This test is not yet approved or cleared by the [place] FDA and has been authorized for detection and/or diagnosis of SARS-CoV-2 by FDA under an Emergency Use Authorization (EUA). This EUA will remain in effect (meaning this test can be used) for the duration of the COVID-19 declaration under Section 564(b)(1) of the Act, 21 U.S.C. section 360bbb-3(b)(1), unless the authorization is terminated or revoked.  Performed at [facility], [place] [address], [place], [place] [address]     FURTHER DISCHARGE INSTRUCTIONS:  Get Medicines reviewed and adjusted: Please take all your medications with you for your next  visit with your Primary MD  Laboratory/radiological data: Please request your Primary MD to go over all hospital tests and procedure/radiological results at the follow up, please ask your Primary MD to get all Hospital records sent to his/her office.  In some cases, they will be blood work, cultures and biopsy results pending at the time of your discharge. Please request that your primary care M.D. goes through all the records of your hospital data and follows up on these results.  Also Note the following: If you experience worsening of your admission symptoms, develop shortness of breath, life threatening emergency, suicidal or homicidal thoughts you must seek medical attention immediately by calling 911 or  calling your MD immediately  if symptoms less severe.  You must read complete instructions/literature along with all the possible adverse reactions/side effects for all the Medicines you take and that have been prescribed to you. Take any new Medicines after you have completely understood and accpet all the possible adverse reactions/side effects.   Do not drive when taking Pain medications or sleeping medications (Benzodaizepines)  Do not take more than prescribed Pain, Sleep and Anxiety Medications. It is not advisable to combine anxiety,sleep and pain medications without talking with your primary care practitioner  Special Instructions: If you have smoked or chewed Tobacco  in the last 2 yrs please stop smoking, stop any regular Alcohol  and or any Recreational drug use.  Wear Seat belts while driving.  Please note: You were cared for by a hospitalist during your hospital stay. Once you are discharged, your primary care physician will handle any further medical issues. Please note that NO REFILLS for any discharge medications will be authorized once you are discharged, as it is imperative that you return to your primary care physician (or establish a relationship with a primary care  physician if you do not have one) for your post hospital discharge needs so that they can reassess your need for medications and monitor your lab values.  Total Time spent coordinating discharge including counseling, education and face to face time equals greater than 30 minutes.  SignedOren Binet 02/22/2022 9:46 AM

## 2022-02-22 NOTE — TOC Transition Note (Signed)
Transition of Care [facility]) - CM/SW Discharge Note   Patient Details  Name: Kaylee Murphy MRN: [id number] Date of Birth: 05/28/1961  [facility] CM/SW Contact:  Pollie Friar, RN Phone Number: 02/22/2022, 10:32 AM   Clinical Narrative:    Patient is discharging home with outpatient therapy arranged through [facility]. Orders in Epic and information on the AVS. PcP: [employer]. Pt's discharge medications to be delivered to the room per [facility].  Pt states she has transportation home via her son.   Final next level of care: OP Rehab Barriers to Discharge: No Barriers Identified   Patient Goals and CMS Choice     Choice offered to / list presented to : Patient  Discharge Placement                       Discharge Plan and Services                                     Social Determinants of Health (SDOH) Interventions     Readmission Risk Interventions     No data to display

## 2022-02-22 NOTE — Plan of Care (Signed)

## 2022-02-25 ENCOUNTER — Ambulatory Visit: Payer: Self-pay | Admitting: Physical Therapy

## 2022-02-25 ENCOUNTER — Encounter: Payer: Self-pay | Admitting: Speech Pathology

## 2022-02-25 NOTE — Therapy (Deleted)
OUTPATIENT SPEECH LANGUAGE PATHOLOGY EVALUATION   Patient Name: Kaylee Murphy MRN: [id number] DOB:01-14-1961, 61 y.o., female Today's Date: 02/25/2022  PCP: none on file REFERRING PROVIDER: Jonetta Osgood., MD   Past Medical History:  Diagnosis Date   Anemia    Anxiety    Chronic back pain    Chronic shoulder pain 10 years   s/p multiple b/l rotator cuff repair sx - per MRI 04/2010 -  Delamination of the infraspinatus with mild fatty atrophysuggest distal tendon tear with medial propagation. Depending onthe hardware, CT arthrography may add information for evaluation othe rotator cuff.f    Depression    Diabetes mellitus    Hyperlipidemia    Hypertension    Past Surgical History:  Procedure Laterality Date   BREAST REDUCTION SURGERY  1996   ROTATOR CUFF REPAIR Right 2005   ROTATOR CUFF REPAIR Right 2004   ROTATOR CUFF REPAIR Left 2007   TUBAL LIGATION  1991   Patient Active Problem List   Diagnosis Date Noted   Acute CVA (cerebrovascular accident) ([place]) 02/19/2022   Chronic shoulder pain 02/19/2022   Controlled type 2 diabetes mellitus without complication, without long-term current use of insulin ([place]) 02/19/2022   Leukocytosis 02/19/2022   Tobacco abuse 02/19/2022   Chest pain 10/23/2011   Iron deficiency anemia 10/23/2011   Anxiety 10/20/2011   Preventative health care 10/20/2011   Hyperlipidemia 02/13/2010   DEPRESSION, MAJOR 07/24/2009   DM 06/11/2009   Essential hypertension, benign 06/11/2009   ROTATOR CUFF REPAIR, HX OF 06/11/2009    ONSET DATE: 02-15-22   REFERRING DIAG: I63.9 (ICD-10-CM) - Acute CVA (cerebrovascular accident) ([place])   THERAPY DIAG:  Cognitive communication deficit  Rationale for Evaluation and Treatment Rehabilitation  SUBJECTIVE:   SUBJECTIVE STATEMENT: *** Pt accompanied by: {accompnied:27141}  PERTINENT HISTORY: 61 y.o. female  who presented to ED with complaints of diplopia and left sided weakness that started on  02/15/22. Seen at [facility] with negative brain MRI and CTA head/neck. Weakness worsening and returned to ED; +acute infarct at the right pontomedullary junction;   PMH significant of T2DM, HTN, HLD, depression and anxiety, chronic shoulder pain    PAIN:  Are you having pain? {OPRCPAIN:27236}   FALLS: Has patient fallen in last 6 months?  {FFMBWGYK:59935}  LIVING ENVIRONMENT: Lives with: {OPRC lives with:25569::"lives with their family"} Lives in: {Lives in:25570}  PLOF:  Level of assistance: {TSVXBLT:90300} Employment: {SLPemployment:25674}   PATIENT GOALS ***  OBJECTIVE:   DIAGNOSTIC FINDINGS: 02-19-22 MR BRAIN WO CONTRAST IMPRESSION: Acute small infarct at the right pontomedullary junction. Additional smaller acute infarcts more dorsally and superiorly along the floor of the fourth ventricle.   COGNITION: Overall cognitive status: {cognition:24006}  COGNITIVE COMMUNICATION Following directions: {commands:24018}  Auditory comprehension: {WFL-Impaired:25365} Verbal expression: {WFL-Impaired:25365} Functional communication: {WFL-Impaired:25365}  ORAL MOTOR EXAMINATION Facial : Symmetry impaired: {OM facial:25656} Lingual: {OM Lingual:25664} Velum: {OM Velum:25665} Mandible: {OM mandible:25667} Cough: {OM Cough:25660} Voice: {OM Voice:25662}  STANDARDIZED ASSESSMENTS: {SLPstandardizedassessment:27092}   PATIENT REPORTED OUTCOME MEASURES (PROM): {SLPPROM:27095}   TODAY'S TREATMENT:  ***   PATIENT EDUCATION: Education details: see above Person educated: {Person educated:25204} Education method: {Education Method:25205} Education comprehension: {Education Comprehension:25206}     GOALS: Goals reviewed with patient? {yes/no:20286}  SHORT TERM GOALS: Target date: {follow up:25551}  (Remove Blue Hyperlink)  *** Baseline: Goal status: {GOALSTATUS:25110}  2.  *** Baseline:  Goal status: {GOALSTATUS:25110}  3.  *** Baseline:  Goal status:  {GOALSTATUS:25110}  4.  *** Baseline:  Goal status: {GOALSTATUS:25110}  5.  *** Baseline:  Goal status: {GOALSTATUS:25110}  6.  *** Baseline:  Goal status: {GOALSTATUS:25110}  LONG TERM GOALS: Target date: {follow up:25551}  (Remove Blue Hyperlink)  *** Baseline:  Goal status: {GOALSTATUS:25110}  2.  *** Baseline:  Goal status: {GOALSTATUS:25110}  3.  *** Baseline:  Goal status: {GOALSTATUS:25110}  4.  *** Baseline:  Goal status: {GOALSTATUS:25110}  5.  *** Baseline:  Goal status: {GOALSTATUS:25110}  6.  *** Baseline:  Goal status: {GOALSTATUS:25110}  ASSESSMENT:  CLINICAL IMPRESSION: Patient is a 61 y.o. F who was seen today for cognitive liguistic change in baseline resulting from CVA. ***   OBJECTIVE IMPAIRMENTS include {SLPOBJIMP:27107}. These impairments are limiting patient from {SLPLIMIT:27108}. Factors affecting potential to achieve goals and functional outcome are {SLP factors:25450}.. Patient will benefit from skilled SLP services to address above impairments and improve overall function.  REHAB POTENTIAL: {rehabpotential:25112}  PLAN: SLP FREQUENCY: {rehab frequency:25116}  SLP DURATION: {rehab duration:25117}  PLANNED INTERVENTIONS: {SLP treatment/interventions:25449}    Su Monks, CCC-SLP 02/25/2022, 11:34 AM

## 2022-02-26 ENCOUNTER — Ambulatory Visit: Payer: Medicare Other | Admitting: Cardiology

## 2022-02-26 NOTE — Progress Notes (Deleted)
Patient referred by No ref. provider found for ***  Subjective:   Kaylee Murphy, female    DOB: 01-May-1961, 61 y.o.   MRN: [id number]  *** No chief complaint on file.   *** HPI  55 African-American female with hypertension, type 2 diabetes mellitus, hyperlipidemia, nicotine dependence, chronic pain on methadone, stroke (02/2022) with left-sided weakness  ***  *** Past Medical History:  Diagnosis Date   Anemia    Anxiety    Chronic back pain    Chronic shoulder pain 10 years   s/p multiple b/l rotator cuff repair sx - per MRI 04/2010 -  Delamination of the infraspinatus with mild fatty atrophysuggest distal tendon tear with medial propagation. Depending onthe hardware, CT arthrography may add information for evaluation othe rotator cuff.f    Depression    Diabetes mellitus    Hyperlipidemia    Hypertension     *** Past Surgical History:  Procedure Laterality Date   BREAST REDUCTION SURGERY  1996   ROTATOR CUFF REPAIR Right 2005   ROTATOR CUFF REPAIR Right 2004   ROTATOR CUFF REPAIR Left 2007   TUBAL LIGATION  1991    *** Social History   Tobacco Use  Smoking Status Every Day   Packs/day: 0.25   Types: Cigarettes  Smokeless Tobacco Never  Tobacco Comments   5-6 cigs/day    Social History   Substance and Sexual Activity  Alcohol Use No    *** Family History  Problem Relation Age of Onset   Cancer Mother    Stroke Father    Colon cancer Neg Hx    Esophageal cancer Neg Hx    Rectal cancer Neg Hx    Stomach cancer Neg Hx     ***  Current Outpatient Medications:    Accu-Chek FastClix Lancets MISC, use to check blood sugar 4 times daily., Disp: 102 each, Rfl: 0   aspirin EC 81 MG tablet, Take 1 tablet (81 mg total) by mouth daily. Swallow whole., Disp: 30 tablet, Rfl: 12   atenolol (TENORMIN) 50 MG tablet, Take 50 mg by mouth every morning., Disp: , Rfl:    Blood Glucose Monitoring Suppl (ACCU-CHEK GUIDE) w/Device KIT, Use to check blood sugars  up to 4 times daily, Disp: 1 kit, Rfl: 0   clopidogrel (PLAVIX) 75 MG tablet, Take 1 tablet (75 mg total) by mouth daily. Take along with aspirin for 21 days-after 21 days stop Plavix and continue on aspirin alone., Disp: 21 tablet, Rfl: 0   gabapentin (NEURONTIN) 400 MG capsule, Take 400 mg by mouth 2 (two) times daily., Disp: , Rfl:    glucose blood test strip, use to check blood sugar 4 times daily, Disp: 100 each, Rfl: 0   ibuprofen (ADVIL) 200 MG tablet, Take 400 mg by mouth 2 (two) times daily as needed (pain)., Disp: , Rfl:    metFORMIN (GLUCOPHAGE) 500 MG tablet, Take 1 tablet (500 mg total) by mouth 2 (two) times daily with a meal., Disp: 60 tablet, Rfl: 3   METHADONE HCL PO, Take 80 mg by mouth every morning., Disp: , Rfl:    rosuvastatin (CRESTOR) 20 MG tablet, Take 1 tablet (20 mg total) by mouth daily., Disp: 30 tablet, Rfl: 3   Cardiovascular and other pertinent studies:  Reviewed external labs and tests, independently interpreted  *** EKG ***/***/202***: ***  Echocardiogram 02/20/2022:  1. Hypokinesis of the inferior and inferolateral wall with overall mild  LV dysfunction.   2. Left ventricular ejection fraction,  by estimation, is 45 to 50%. The  left ventricle has mildly decreased function. The left ventricle  demonstrates regional wall motion abnormalities (see scoring  diagram/findings for description). The left ventricular   internal cavity size was mildly dilated. There is mild left ventricular  hypertrophy. Left ventricular diastolic parameters are consistent with  Grade I diastolic dysfunction (impaired relaxation). Elevated left atrial  pressure.   3. Right ventricular systolic function is normal. The right ventricular  size is normal.   4. The mitral valve is normal in structure. Trivial mitral valve  regurgitation. No evidence of mitral stenosis.   5. The aortic valve is tricuspid. Aortic valve regurgitation is not  visualized. Aortic valve  sclerosis/calcification is present, without any  evidence of aortic stenosis.   6. The inferior vena cava is normal in size with greater than 50%  respiratory variability, suggesting right atrial pressure of 3 mmHg.   CTA head/neck 02/19/2022: 1. No intracranial large vessel occlusion or new stenosis. Unchanged right cavernous carotid and left V4 segment stenosis. 2.  No hemodynamically significant stenosis in the neck. 3.  Emphysema (ICD10-J43.9).   *** Recent labs: 02/19/2022: Glucose 187, BUN/Cr 7/0.89. EGFR >60. Na/K 136/3.5. Rest of the CMP normal H/H 11/36. MCV 88. Platelets 316 HbA1C 6.8% Chol 174, TG 205, HDL 30, LDL 103  *** ROS      *** There were no vitals filed for this visit.   There is no height or weight on file to calculate BMI. There were no vitals filed for this visit.  *** Objective:   Physical Exam    ***     Visit diagnoses: No diagnosis found.   No orders of the defined types were placed in this encounter.    Medication changes this visit: There are no discontinued medications.  No orders of the defined types were placed in this encounter.    Assessment & Recommendations:   ***  ***  Thank you for referring the patient to [place]. Please feel free to contact with any questions.   Nigel Mormon, MD Pager: [phone] Office: [phone]

## 2022-03-08 ENCOUNTER — Ambulatory Visit: Payer: Medicare Other | Attending: Internal Medicine | Admitting: Physical Therapy

## 2022-03-08 ENCOUNTER — Ambulatory Visit: Payer: Medicare Other | Admitting: Occupational Therapy

## 2022-03-08 ENCOUNTER — Ambulatory Visit: Payer: Medicare Other | Admitting: Speech Pathology

## 2022-03-08 NOTE — Therapy (Deleted)
OUTPATIENT OCCUPATIONAL THERAPY NEURO EVALUATION  Patient Name: Kaylee Murphy MRN: [id number] DOB:09/25/1960, 61 y.o., female Today's Date: 03/08/2022  PCP: None REFERRING PROVIDER: Jonetta Osgood, MD     Past Medical History:  Diagnosis Date   Anemia    Anxiety    Chronic back pain    Chronic shoulder pain 10 years   s/p multiple b/l rotator cuff repair sx - per MRI 04/2010 -  Delamination of the infraspinatus with mild fatty atrophysuggest distal tendon tear with medial propagation. Depending onthe hardware, CT arthrography may add information for evaluation othe rotator cuff.f    Depression    Diabetes mellitus    Hyperlipidemia    Hypertension    Past Surgical History:  Procedure Laterality Date   BREAST REDUCTION SURGERY  1996   ROTATOR CUFF REPAIR Right 2005   ROTATOR CUFF REPAIR Right 2004   ROTATOR CUFF REPAIR Left 2007   TUBAL LIGATION  1991   Patient Active Problem List   Diagnosis Date Noted   Cerebrovascular accident (CVA) ([place]) 02/19/2022   Chronic shoulder pain 02/19/2022   Controlled type 2 diabetes mellitus without complication, without long-term current use of insulin ([place]) 02/19/2022   Leukocytosis 02/19/2022   Tobacco abuse 02/19/2022   Chest pain 10/23/2011   Iron deficiency anemia 10/23/2011   Anxiety 10/20/2011   Preventative health care 10/20/2011   Hyperlipidemia 02/13/2010   DEPRESSION, MAJOR 07/24/2009   DM 06/11/2009   Essential hypertension, benign 06/11/2009   ROTATOR CUFF REPAIR, HX OF 06/11/2009     Rationale for Evaluation and Treatment Rehabilitation    ONSET DATE: 02/19/2022   REFERRING DIAG: I63.9 (ICD-10-CM) - Acute CVA (cerebrovascular accident) ([place])   THERAPY DIAG:  No diagnosis found.  SUBJECTIVE:   SUBJECTIVE STATEMENT: ***   Pt accompanied by: {accompnied:27141}  PERTINENT HISTORY: 61 y.o. female  who presented to ED with complaints of diplopia and left sided weakness that started on 02/15/22. Seen at [facility]  with negative brain MRI and CTA head/neck. Weakness worsening and returned to ED; imaging positive for acute infarct at the right pontomedullary junction; PMH significant of T2DM, HTN, HLD, depression and anxiety, chronic shoulder pain   PRECAUTIONS: {Therapy precautions:24002}  WEIGHT BEARING RESTRICTIONS No  PAIN:  Are you having pain? {OPRCPAIN:27236}  FALLS: Has patient fallen in last 6 months? {fallsyesno:27318}  LIVING ENVIRONMENT: Lives with: {OPRC lives with:25569::"lives with their family"} Lives in: {Lives in:25570} Stairs: {opstairs:27293} Has following equipment at home: {Assistive devices:23999}  PLOF: Vocation/Vocational requirements: ***, Leisure: ***, and ***  PATIENT GOALS ***  OBJECTIVE:    DIAGNOSTIC FINDINGS: Seen at [facility] 02/19/22 with negative brain MRI and CTA head/neck. Weakness worsening and returned to ED; with imaging positive for acute infarct at the right pontomedullary junction  HAND DOMINANCE: {MISC; OT HAND [phone]}  ADLs: Overall ADLs: *** Eating: *** Grooming: *** UB Dressing: *** LB Dressing: *** Toileting: *** Bathing: *** Tub Shower transfers: *** Equipment: {equipment:25573}   IADLs: Shopping: *** Light housekeeping: *** Meal Prep: *** Community mobility: *** Medication management: *** Financial management: *** Handwriting: {OTWRITTENEXPRESSION:25361}  MOBILITY STATUS: {OTMOBILITY:25360}  POSTURE COMMENTS:  {posture:25561}  Sitting balance: {sitting balance:25483}  ACTIVITY TOLERANCE: Activity tolerance: ***  FUNCTIONAL OUTCOME MEASURES: {OTFUNCTIONALMEASURES:27238}  UE ROM     {AROM/PROM:27142} ROM Right 01/11/2022 Left 01/11/2022  Shoulder flexion    Shoulder abduction    Shoulder adduction    Shoulder extension    Shoulder internal rotation    Shoulder external rotation    Elbow flexion  Elbow extension    Wrist flexion    Wrist extension    Wrist ulnar deviation    Wrist radial deviation     Wrist pronation    Wrist supination    (Blank rows = not tested)   UE MMT:     MMT Right 01/11/2022 Left 01/11/2022  Shoulder flexion    Shoulder abduction    Shoulder adduction    Shoulder extension    Shoulder internal rotation    Shoulder external rotation    Middle trapezius    Lower trapezius    Elbow flexion    Elbow extension    Wrist flexion    Wrist extension    Wrist ulnar deviation    Wrist radial deviation    Wrist pronation    Wrist supination    (Blank rows = not tested)  HAND FUNCTION: {handfunction:27230}  COORDINATION: {otcoordination:27237}  SENSATION: {sensation:27233}  EDEMA: ***  MUSCLE TONE: {UETONE:25567}  COGNITION: Overall cognitive status: {cognition:24006}  VISION: Subjective report: *** Baseline vision: {OTBASELINEVISION:25363} Visual history: {OTVISUALHISTORY:25364}  VISION ASSESSMENT: {visionassessment:27231}  PERCEPTION: {Perception:25564}  PRAXIS: {Praxis:25565}  OBSERVATIONS: ***  TODAY'S TREATMENT:  *** None today - evaluation only.   PATIENT EDUCATION: Education details: Education on role and purpose of OT  Person educated: {Person educated:25204} Education method: {Education Method:25205} Education comprehension: {Education Comprehension:25206}   HOME EXERCISE PROGRAM: *** To Be Established    GOALS: Goals reviewed with patient? {yes/no:20286}  SHORT TERM GOALS: Target date: {follow up:25551}   Status:  1 *** Baseline: *** Initial  2 *** Baseline: *** Initial  3 *** Baseline: *** Initial  4 *** Baseline: *** Initial  5 *** Baseline: *** Initial     LONG TERM GOALS: Target date: {follow up:25551}    Status:  1 *** Baseline: *** {Progress Towards Goals:21014066}  2 *** Baseline: *** {Progress Towards Goals:21014066}  3 *** Baseline: *** {Progress Towards Goals:21014066}  4 *** Baseline: *** {Progress Towards Goals:21014066}  5 *** Baseline: *** {Progress Towards Goals:21014066}      ASSESSMENT:  CLINICAL IMPRESSION: Patient is a 61 y.o. female who was seen today for occupational therapy evaluation for acute CVA. Pt presents with deficits with ***. Skilled occupational therapy is recommended to target listed areas of deficit and increase independence with ADLs and IADLs and decrease caregiver burden.     PERFORMANCE DEFICITS in functional skills including {OT physical skills:25468}, cognitive skills including {OT cognitive skills:25469}, and psychosocial skills including {OT psychosocial skills:25470}.   IMPAIRMENTS are limiting patient from {OT performance deficits:25471}.   COMORBIDITIES {Comorbidities:25485} that affects occupational performance. Patient will benefit from skilled OT to address above impairments and improve overall function.  MODIFICATION OR ASSISTANCE TO COMPLETE EVALUATION: {OT modification:25474}  OT OCCUPATIONAL PROFILE AND HISTORY: {OT PROFILE AND HISTORY:25484}  CLINICAL DECISION MAKING: {OT CDM:25475}  REHAB POTENTIAL: {rehabpotential:25112}  EVALUATION COMPLEXITY: {Evaluation complexity:25115}    PLAN: OT FREQUENCY: {rehab frequency:25116}  OT DURATION: {rehab duration:25117}  PLANNED INTERVENTIONS: {OT Interventions:25467}  RECOMMENDED OTHER SERVICES: ST and PT evaluations today.  CONSULTED AND AGREED WITH PLAN OF CARE: {NAT:55732}  PLAN FOR NEXT SESSION: ***   Zachery Conch, OT 03/08/2022, 8:19 AM

## 2022-03-10 ENCOUNTER — Other Ambulatory Visit (HOSPITAL_COMMUNITY): Payer: Self-pay

## 2022-03-10 ENCOUNTER — Telehealth (HOSPITAL_COMMUNITY): Payer: Self-pay

## 2022-03-10 NOTE — Telephone Encounter (Signed)
Transitions of Care Pharmacy  ° °Call attempted for a pharmacy transitions of care follow-up. HIPAA appropriate voicemail was left with call back information provided.  ° °Call attempt #1. Will follow-up in 2-3 days.  °  °

## 2022-03-17 ENCOUNTER — Telehealth (HOSPITAL_COMMUNITY): Payer: Self-pay

## 2022-03-17 NOTE — Telephone Encounter (Signed)
Transitions of Care Pharmacy   Call attempted for a pharmacy transitions of care follow-up. Patient is at work and unable to talk at this time.  Call attempt #2. Will follow-up in 2-3 days.

## 2022-03-18 ENCOUNTER — Telehealth (HOSPITAL_COMMUNITY): Payer: Self-pay

## 2022-03-18 NOTE — Telephone Encounter (Signed)
Attempted follow up call for Transitions of Care Pharmacy. Left voicemail for patient.  Call attempt #3

## 2022-04-20 ENCOUNTER — Inpatient Hospital Stay: Payer: Medicare Other | Admitting: Neurology

## 2022-04-20 ENCOUNTER — Encounter: Payer: Self-pay | Admitting: Neurology

## 2022-06-29 ENCOUNTER — Other Ambulatory Visit: Payer: Self-pay | Admitting: Registered Nurse

## 2022-06-29 DIAGNOSIS — Z1231 Encounter for screening mammogram for malignant neoplasm of breast: Secondary | ICD-10-CM

## 2022-08-26 ENCOUNTER — Inpatient Hospital Stay (HOSPITAL_COMMUNITY): Payer: Medicare Other

## 2022-08-26 ENCOUNTER — Emergency Department (HOSPITAL_COMMUNITY): Payer: Medicare Other

## 2022-08-26 DIAGNOSIS — Y92239 Unspecified place in hospital as the place of occurrence of the external cause: Secondary | ICD-10-CM | POA: Diagnosis not present

## 2022-08-26 DIAGNOSIS — Z529 Donor of unspecified organ or tissue: Secondary | ICD-10-CM

## 2022-08-26 DIAGNOSIS — Z7189 Other specified counseling: Secondary | ICD-10-CM

## 2022-08-26 DIAGNOSIS — Z79891 Long term (current) use of opiate analgesic: Secondary | ICD-10-CM

## 2022-08-26 DIAGNOSIS — R4182 Altered mental status, unspecified: Secondary | ICD-10-CM | POA: Diagnosis not present

## 2022-08-26 DIAGNOSIS — F419 Anxiety disorder, unspecified: Secondary | ICD-10-CM | POA: Diagnosis present

## 2022-08-26 DIAGNOSIS — G40409 Other generalized epilepsy and epileptic syndromes, not intractable, without status epilepticus: Secondary | ICD-10-CM | POA: Diagnosis not present

## 2022-08-26 DIAGNOSIS — J181 Lobar pneumonia, unspecified organism: Secondary | ICD-10-CM | POA: Diagnosis not present

## 2022-08-26 DIAGNOSIS — Z823 Family history of stroke: Secondary | ICD-10-CM

## 2022-08-26 DIAGNOSIS — E876 Hypokalemia: Secondary | ICD-10-CM | POA: Diagnosis present

## 2022-08-26 DIAGNOSIS — G931 Anoxic brain damage, not elsewhere classified: Secondary | ICD-10-CM

## 2022-08-26 DIAGNOSIS — Z79899 Other long term (current) drug therapy: Secondary | ICD-10-CM

## 2022-08-26 DIAGNOSIS — Z515 Encounter for palliative care: Secondary | ICD-10-CM | POA: Diagnosis not present

## 2022-08-26 DIAGNOSIS — R9431 Abnormal electrocardiogram [ECG] [EKG]: Secondary | ICD-10-CM | POA: Diagnosis present

## 2022-08-26 DIAGNOSIS — G253 Myoclonus: Secondary | ICD-10-CM | POA: Diagnosis present

## 2022-08-26 DIAGNOSIS — E873 Alkalosis: Secondary | ICD-10-CM | POA: Diagnosis present

## 2022-08-26 DIAGNOSIS — E1165 Type 2 diabetes mellitus with hyperglycemia: Secondary | ICD-10-CM | POA: Diagnosis present

## 2022-08-26 DIAGNOSIS — I4891 Unspecified atrial fibrillation: Secondary | ICD-10-CM | POA: Diagnosis present

## 2022-08-26 DIAGNOSIS — Z886 Allergy status to analgesic agent status: Secondary | ICD-10-CM

## 2022-08-26 DIAGNOSIS — I4721 Torsades de pointes: Secondary | ICD-10-CM

## 2022-08-26 DIAGNOSIS — I429 Cardiomyopathy, unspecified: Secondary | ICD-10-CM | POA: Diagnosis present

## 2022-08-26 DIAGNOSIS — Z7984 Long term (current) use of oral hypoglycemic drugs: Secondary | ICD-10-CM

## 2022-08-26 DIAGNOSIS — R7401 Elevation of levels of liver transaminase levels: Secondary | ICD-10-CM | POA: Diagnosis present

## 2022-08-26 DIAGNOSIS — I110 Hypertensive heart disease with heart failure: Secondary | ICD-10-CM | POA: Diagnosis present

## 2022-08-26 DIAGNOSIS — Z888 Allergy status to other drugs, medicaments and biological substances status: Secondary | ICD-10-CM

## 2022-08-26 DIAGNOSIS — I462 Cardiac arrest due to underlying cardiac condition: Secondary | ICD-10-CM | POA: Diagnosis present

## 2022-08-26 DIAGNOSIS — R570 Cardiogenic shock: Secondary | ICD-10-CM | POA: Diagnosis not present

## 2022-08-26 DIAGNOSIS — I4510 Unspecified right bundle-branch block: Secondary | ICD-10-CM | POA: Diagnosis present

## 2022-08-26 DIAGNOSIS — R569 Unspecified convulsions: Secondary | ICD-10-CM

## 2022-08-26 DIAGNOSIS — F17210 Nicotine dependence, cigarettes, uncomplicated: Secondary | ICD-10-CM | POA: Diagnosis present

## 2022-08-26 DIAGNOSIS — I469 Cardiac arrest, cause unspecified: Secondary | ICD-10-CM | POA: Diagnosis not present

## 2022-08-26 DIAGNOSIS — Z66 Do not resuscitate: Secondary | ICD-10-CM

## 2022-08-26 DIAGNOSIS — F32A Depression, unspecified: Secondary | ICD-10-CM | POA: Diagnosis present

## 2022-08-26 DIAGNOSIS — I5023 Acute on chronic systolic (congestive) heart failure: Secondary | ICD-10-CM | POA: Diagnosis not present

## 2022-08-26 DIAGNOSIS — I959 Hypotension, unspecified: Secondary | ICD-10-CM | POA: Diagnosis present

## 2022-08-26 DIAGNOSIS — Z7982 Long term (current) use of aspirin: Secondary | ICD-10-CM

## 2022-08-26 DIAGNOSIS — Z885 Allergy status to narcotic agent status: Secondary | ICD-10-CM

## 2022-08-26 DIAGNOSIS — G8929 Other chronic pain: Secondary | ICD-10-CM | POA: Diagnosis present

## 2022-08-26 DIAGNOSIS — I4901 Ventricular fibrillation: Principal | ICD-10-CM | POA: Diagnosis present

## 2022-08-26 DIAGNOSIS — T886XXA Anaphylactic reaction due to adverse effect of correct drug or medicament properly administered, initial encounter: Secondary | ICD-10-CM | POA: Diagnosis not present

## 2022-08-26 DIAGNOSIS — M79606 Pain in leg, unspecified: Secondary | ICD-10-CM | POA: Diagnosis present

## 2022-08-26 DIAGNOSIS — D649 Anemia, unspecified: Secondary | ICD-10-CM | POA: Diagnosis present

## 2022-08-26 DIAGNOSIS — Z809 Family history of malignant neoplasm, unspecified: Secondary | ICD-10-CM

## 2022-08-26 DIAGNOSIS — E781 Pure hyperglyceridemia: Secondary | ICD-10-CM | POA: Diagnosis present

## 2022-08-26 DIAGNOSIS — R008 Other abnormalities of heart beat: Secondary | ICD-10-CM | POA: Diagnosis present

## 2022-08-26 DIAGNOSIS — T4275XA Adverse effect of unspecified antiepileptic and sedative-hypnotic drugs, initial encounter: Secondary | ICD-10-CM | POA: Diagnosis not present

## 2022-08-26 DIAGNOSIS — Z7902 Long term (current) use of antithrombotics/antiplatelets: Secondary | ICD-10-CM

## 2022-08-26 DIAGNOSIS — I214 Non-ST elevation (NSTEMI) myocardial infarction: Secondary | ICD-10-CM | POA: Diagnosis present

## 2022-08-26 DIAGNOSIS — Z9911 Dependence on respirator [ventilator] status: Secondary | ICD-10-CM | POA: Diagnosis not present

## 2022-08-26 DIAGNOSIS — R001 Bradycardia, unspecified: Secondary | ICD-10-CM | POA: Diagnosis present

## 2022-08-26 DIAGNOSIS — J9601 Acute respiratory failure with hypoxia: Secondary | ICD-10-CM

## 2022-08-26 DIAGNOSIS — Z8673 Personal history of transient ischemic attack (TIA), and cerebral infarction without residual deficits: Secondary | ICD-10-CM

## 2022-08-26 DIAGNOSIS — I5021 Acute systolic (congestive) heart failure: Secondary | ICD-10-CM

## 2022-08-26 LAB — I-STAT ARTERIAL BLOOD GAS, ED
Acid-base deficit: 7 mmol/L — ABNORMAL HIGH (ref 0.0–2.0)
Bicarbonate: 20 mmol/L (ref 20.0–28.0)
Calcium, Ion: 1.15 mmol/L (ref 1.15–1.40)
HCT: 36 % (ref 36.0–46.0)
Hemoglobin: 12.2 g/dL (ref 12.0–15.0)
O2 Saturation: 100 %
Patient temperature: 96.9
Potassium: 3 mmol/L — ABNORMAL LOW (ref 3.5–5.1)
Sodium: 141 mmol/L (ref 135–145)
TCO2: 21 mmol/L — ABNORMAL LOW (ref 22–32)
pCO2 arterial: 44.5 mmHg (ref 32–48)
pH, Arterial: 7.255 — ABNORMAL LOW (ref 7.35–7.45)
pO2, Arterial: 233 mmHg — ABNORMAL HIGH (ref 83–108)

## 2022-08-26 LAB — COMPREHENSIVE METABOLIC PANEL
ALT: 37 U/L (ref 0–44)
AST: 58 U/L — ABNORMAL HIGH (ref 15–41)
Albumin: 2.7 g/dL — ABNORMAL LOW (ref 3.5–5.0)
Alkaline Phosphatase: 88 U/L (ref 38–126)
Anion gap: 14 (ref 5–15)
BUN: 7 mg/dL (ref 6–20)
CO2: 16 mmol/L — ABNORMAL LOW (ref 22–32)
Calcium: 7.4 mg/dL — ABNORMAL LOW (ref 8.9–10.3)
Chloride: 111 mmol/L (ref 98–111)
Creatinine, Ser: 0.93 mg/dL (ref 0.44–1.00)
GFR, Estimated: >60 mL/min (ref >60)
Glucose, Bld: 267 mg/dL — ABNORMAL HIGH (ref 70–99)
Potassium: 3.2 mmol/L — ABNORMAL LOW (ref 3.5–5.1)
Sodium: 141 mmol/L (ref 135–145)
Total Bilirubin: 0.2 mg/dL — ABNORMAL LOW (ref 0.3–1.2)
Total Protein: 6.1 g/dL — ABNORMAL LOW (ref 6.5–8.1)

## 2022-08-26 LAB — TYPE AND SCREEN
ABO/RH(D): O POS
Antibody Screen: NEGATIVE

## 2022-08-26 LAB — LACTIC ACID, PLASMA: Lactic Acid, Venous: 3.1 mmol/L (ref 0.5–1.9)

## 2022-08-26 LAB — RAPID URINE DRUG SCREEN, HOSP PERFORMED
Amphetamines: NOT DETECTED
Barbiturates: NOT DETECTED
Benzodiazepines: POSITIVE — AB
Cocaine: NOT DETECTED
Opiates: NOT DETECTED
Tetrahydrocannabinol: NOT DETECTED

## 2022-08-26 LAB — ETHANOL: Alcohol, Ethyl (B): <10 mg/dL (ref <10)

## 2022-08-26 LAB — TSH: TSH: 1.869 u[IU]/mL (ref 0.350–4.500)

## 2022-08-26 LAB — CBC WITH DIFFERENTIAL/PLATELET
Abs Immature Granulocytes: 0.43 10*3/uL — ABNORMAL HIGH (ref 0.00–0.07)
Basophils Absolute: 0.1 10*3/uL (ref 0.0–0.1)
Basophils Relative: 0 %
Eosinophils Absolute: 0.1 10*3/uL (ref 0.0–0.5)
Eosinophils Relative: 0 %
HCT: 36 % (ref 36.0–46.0)
Hemoglobin: 11.6 g/dL — ABNORMAL LOW (ref 12.0–15.0)
Immature Granulocytes: 2 %
Lymphocytes Relative: 14 %
Lymphs Abs: 3.7 10*3/uL (ref 0.7–4.0)
MCH: 26.4 pg (ref 26.0–34.0)
MCHC: 32.2 g/dL (ref 30.0–36.0)
MCV: 82 fL (ref 80.0–100.0)
Monocytes Absolute: 2 10*3/uL — ABNORMAL HIGH (ref 0.1–1.0)
Monocytes Relative: 7 %
Neutro Abs: 21.1 10*3/uL — ABNORMAL HIGH (ref 1.7–7.7)
Neutrophils Relative %: 77 %
Platelets: 381 10*3/uL (ref 150–400)
RBC: 4.39 MIL/uL (ref 3.87–5.11)
RDW: 14.4 % (ref 11.5–15.5)
WBC: 27.4 10*3/uL — ABNORMAL HIGH (ref 4.0–10.5)
nRBC: 0 % (ref 0.0–0.2)

## 2022-08-26 LAB — CBG MONITORING, ED
Glucose-Capillary: 205 mg/dL — ABNORMAL HIGH (ref 70–99)
Glucose-Capillary: 212 mg/dL — ABNORMAL HIGH (ref 70–99)

## 2022-08-26 LAB — PROTIME-INR
INR: 1.3 — ABNORMAL HIGH (ref 0.8–1.2)
Prothrombin Time: 16.1 seconds — ABNORMAL HIGH (ref 11.4–15.2)

## 2022-08-26 LAB — TROPONIN I (HIGH SENSITIVITY)
Troponin I (High Sensitivity): 98 ng/L — ABNORMAL HIGH (ref <18)
Troponin I (High Sensitivity): 141 ng/L (ref <18)

## 2022-08-26 LAB — MAGNESIUM: Magnesium: 1.7 mg/dL (ref 1.7–2.4)

## 2022-08-26 MED ORDER — FENTANYL 2500MCG IN NS 250ML (10MCG/ML) PREMIX INFUSION
50.0000 ug/h | INTRAVENOUS | Status: DC
  Administered 2022-08-26: 100 ug/h via INTRAVENOUS
  Administered 2022-08-27: 150 ug/h via INTRAVENOUS
  Filled 2022-08-26: qty 250

## 2022-08-26 MED ORDER — DOCUSATE SODIUM 100 MG PO CAPS: 100.0000 mg | ORAL_CAPSULE | Freq: Two times a day (BID) | ORAL | Status: DC | PRN

## 2022-08-26 MED ORDER — LIDOCAINE BOLUS VIA INFUSION
100.0000 mg | Freq: Once | INTRAVENOUS | Status: AC
  Administered 2022-08-26: 100 mg via INTRAVENOUS
  Filled 2022-08-26: qty 100

## 2022-08-26 MED ORDER — PROPOFOL 1000 MG/100ML IV EMUL
INTRAVENOUS | Status: AC
  Administered 2022-08-26: 35 ug/kg/min via INTRAVENOUS
  Filled 2022-08-26: qty 100

## 2022-08-26 MED ORDER — FENTANYL 2500MCG IN NS 250ML (10MCG/ML) PREMIX INFUSION
50.0000 ug/h | INTRAVENOUS | Status: DC
  Administered 2022-08-26: 50 ug/h via INTRAVENOUS
  Filled 2022-08-26: qty 250

## 2022-08-26 MED ORDER — PROPOFOL 1000 MG/100ML IV EMUL
0.0000 ug/kg/min | INTRAVENOUS | Status: DC
  Filled 2022-08-26 (×2): qty 100

## 2022-08-26 MED ORDER — CALCIUM GLUCONATE-NACL 1-0.675 GM/50ML-% IV SOLN
1.0000 g | Freq: Once | INTRAVENOUS | Status: AC
  Administered 2022-08-26: 1000 mg via INTRAVENOUS
  Filled 2022-08-26: qty 50

## 2022-08-26 MED ORDER — PANTOPRAZOLE SODIUM 40 MG PO TBEC: 40.0000 mg | DELAYED_RELEASE_TABLET | Freq: Every day | ORAL | Status: DC

## 2022-08-26 MED ORDER — FENTANYL BOLUS VIA INFUSION
50.0000 ug | INTRAVENOUS | Status: DC | PRN
  Administered 2022-08-26: 50 ug via INTRAVENOUS

## 2022-08-26 MED ORDER — FAMOTIDINE 20 MG PO TABS: 20.0000 mg | ORAL_TABLET | Freq: Two times a day (BID) | ORAL | Status: DC

## 2022-08-26 MED ORDER — FENTANYL BOLUS VIA INFUSION: 50.0000 ug | INTRAVENOUS | Status: DC | PRN

## 2022-08-26 MED ORDER — FENTANYL CITRATE PF 50 MCG/ML IJ SOSY: 50.0000 ug | PREFILLED_SYRINGE | Freq: Once | INTRAMUSCULAR | Status: DC

## 2022-08-26 MED ORDER — FENTANYL CITRATE PF 50 MCG/ML IJ SOSY
50.0000 ug | PREFILLED_SYRINGE | Freq: Once | INTRAMUSCULAR | Status: AC
  Administered 2022-08-26: 50 ug via INTRAVENOUS
  Filled 2022-08-26: qty 1

## 2022-08-26 MED ORDER — LIDOCAINE IN D5W 4-5 MG/ML-% IV SOLN
1.0000 mg/min | INTRAVENOUS | Status: DC
  Administered 2022-08-26: 1 mg/min via INTRAVENOUS
  Filled 2022-08-26: qty 500

## 2022-08-26 MED ORDER — AMIODARONE HCL IN DEXTROSE 360-4.14 MG/200ML-% IV SOLN: 30.0000 mg/h | INTRAVENOUS | Status: DC

## 2022-08-26 MED ORDER — POLYETHYLENE GLYCOL 3350 17 G PO PACK
17.0000 g | PACK | Freq: Every day | ORAL | Status: DC | PRN
  Administered 2022-08-29: 17 g via ORAL
  Filled 2022-08-26: qty 1

## 2022-08-26 MED ORDER — HEPARIN (PORCINE) 25000 UT/250ML-% IV SOLN
1150.0000 [IU]/h | INTRAVENOUS | Status: DC
  Administered 2022-08-26 – 2022-08-29 (×3): 900 [IU]/h via INTRAVENOUS
  Administered 2022-08-30: 1000 [IU]/h via INTRAVENOUS
  Administered 2022-08-30: 1150 [IU]/h via INTRAVENOUS
  Filled 2022-08-26 (×5): qty 250

## 2022-08-26 MED ORDER — PROPOFOL 1000 MG/100ML IV EMUL
INTRAVENOUS | Status: DC | PRN
  Administered 2022-08-26: 20 ug/kg/min via INTRAVENOUS

## 2022-08-26 MED ORDER — AMIODARONE HCL IN DEXTROSE 360-4.14 MG/200ML-% IV SOLN: 60.0000 mg/h | INTRAVENOUS | Status: DC

## 2022-08-26 MED ORDER — POTASSIUM CHLORIDE 10 MEQ/100ML IV SOLN
10.0000 meq | INTRAVENOUS | Status: AC
  Administered 2022-08-26 – 2022-08-27 (×4): 10 meq via INTRAVENOUS
  Filled 2022-08-26 (×4): qty 100

## 2022-08-26 MED ORDER — MIDAZOLAM HCL 2 MG/2ML IJ SOLN
1.0000 mg | INTRAMUSCULAR | Status: DC | PRN
  Administered 2022-08-26 – 2022-09-05 (×10): 2 mg via INTRAVENOUS
  Filled 2022-08-26 (×6): qty 2

## 2022-08-26 MED ORDER — SODIUM CHLORIDE 0.9 % IV SOLN
100.0000 mg | Freq: Two times a day (BID) | INTRAVENOUS | Status: DC
  Administered 2022-08-26 – 2022-09-03 (×15): 100 mg via INTRAVENOUS
  Filled 2022-08-26 (×17): qty 100

## 2022-08-26 MED ORDER — INSULIN ASPART 100 UNIT/ML IJ SOLN
0.0000 [IU] | INTRAMUSCULAR | Status: DC
  Administered 2022-08-26: 5 [IU] via SUBCUTANEOUS
  Administered 2022-08-27: 2 [IU] via SUBCUTANEOUS
  Administered 2022-08-27: 3 [IU] via SUBCUTANEOUS
  Administered 2022-08-28: 2 [IU] via SUBCUTANEOUS
  Administered 2022-08-28 (×3): 3 [IU] via SUBCUTANEOUS
  Administered 2022-08-28: 2 [IU] via SUBCUTANEOUS
  Administered 2022-08-29 (×4): 3 [IU] via SUBCUTANEOUS
  Administered 2022-08-29: 2 [IU] via SUBCUTANEOUS
  Administered 2022-08-30: 3 [IU] via SUBCUTANEOUS
  Administered 2022-08-30 (×3): 5 [IU] via SUBCUTANEOUS
  Administered 2022-08-30: 3 [IU] via SUBCUTANEOUS
  Administered 2022-08-30 – 2022-08-31 (×2): 2 [IU] via SUBCUTANEOUS
  Administered 2022-08-31 (×3): 5 [IU] via SUBCUTANEOUS
  Administered 2022-08-31: 2 [IU] via SUBCUTANEOUS
  Administered 2022-08-31: 5 [IU] via SUBCUTANEOUS
  Administered 2022-09-01: 8 [IU] via SUBCUTANEOUS
  Administered 2022-09-01: 3 [IU] via SUBCUTANEOUS
  Administered 2022-09-01: 5 [IU] via SUBCUTANEOUS
  Administered 2022-09-01 (×3): 3 [IU] via SUBCUTANEOUS
  Administered 2022-09-01: 2 [IU] via SUBCUTANEOUS
  Administered 2022-09-02 (×2): 5 [IU] via SUBCUTANEOUS
  Administered 2022-09-02: 3 [IU] via SUBCUTANEOUS
  Administered 2022-09-02: 5 [IU] via SUBCUTANEOUS
  Administered 2022-09-02 – 2022-09-03 (×2): 3 [IU] via SUBCUTANEOUS
  Administered 2022-09-03: 5 [IU] via SUBCUTANEOUS
  Administered 2022-09-03: 2 [IU] via SUBCUTANEOUS
  Administered 2022-09-03 (×3): 3 [IU] via SUBCUTANEOUS
  Administered 2022-09-03: 5 [IU] via SUBCUTANEOUS
  Administered 2022-09-04 (×4): 3 [IU] via SUBCUTANEOUS
  Administered 2022-09-04 (×2): 2 [IU] via SUBCUTANEOUS
  Administered 2022-09-05: 3 [IU] via SUBCUTANEOUS
  Administered 2022-09-05: 5 [IU] via SUBCUTANEOUS
  Administered 2022-09-05: 3 [IU] via SUBCUTANEOUS
  Administered 2022-09-05: 2 [IU] via SUBCUTANEOUS
  Administered 2022-09-05: 5 [IU] via SUBCUTANEOUS
  Administered 2022-09-06 (×2): 2 [IU] via SUBCUTANEOUS
  Administered 2022-09-06 (×2): 3 [IU] via SUBCUTANEOUS
  Administered 2022-09-07 (×2): 2 [IU] via SUBCUTANEOUS

## 2022-08-26 MED ORDER — MAGNESIUM SULFATE 2 GM/50ML IV SOLN
2.0000 g | Freq: Once | INTRAVENOUS | Status: AC
  Administered 2022-08-26: 2 g via INTRAVENOUS
  Filled 2022-08-26: qty 50

## 2022-08-26 MED ORDER — ASPIRIN 325 MG PO TABS
325.0000 mg | ORAL_TABLET | Freq: Once | ORAL | Status: AC
  Administered 2022-08-26: 325 mg
  Filled 2022-08-26: qty 1

## 2022-08-26 MED ORDER — HEPARIN BOLUS VIA INFUSION
4000.0000 [IU] | Freq: Once | INTRAVENOUS | Status: AC
  Administered 2022-08-26: 4000 [IU] via INTRAVENOUS
  Filled 2022-08-26: qty 4000

## 2022-08-26 NOTE — Consult Note (Addendum)
CARDIOLOGY CONSULT NOTE    Patient ID: Kaylee Murphy; [id number]; October 06, 1960   Admit date: 08/26/2022 Date of Consult: 08/18/2022  Primary Care Provider: Pcp, No Primary Cardiologist: None Primary Electrophysiologist:  None   Patient Profile:   Kaylee Murphy is a 61 y.o. female with a hx of HTN, DM2, acute CVA in 02/2022 likely secondary to small vessel disease, HLD, who is being seen today for the evaluation of V-fib arrest at the request of Dr. Erin Fulling.  History of Present Illness:   Kaylee Murphy is a 61 y/o F known to have HTN, DM2, acute CVA in 02/2022 likely secondary to small vessel disease, HLD who s/p cardiac arrest.  History is obtained from the ER documentation.  Patient had a witnessed syncopal episode and no pulses were found by EMS. Original rhythm was V-fib and then torsades. Defibrillation shock x 3, epi x 3, ROSC was achieved in 15 minutes, 300 mg amnio, 100 mcg fentanyl, 2.5 Versed were given prior to arrival to the ER. Epi drip at 4 mics per minute infusing on upon arrival.  Jagual airway in place.  Admitted to critical care and cardiology was consulted for further evaluation.  Patient was admitted for acute CVA in 02/2022 with neurological deficits and was deemed to be secondary to small vessel disease. Echo at that time showed LVEF 45 to 50% with inferior and inferolateral wall hypokinesis. She is supposed to follow-up with cardiology as outpatient, with Dr Virgina Jock. However she was a no-show.  EKG showed new onset of right bundle branch block, normal sinus rhythm and frequent PVCs. Troponin is pending.  Complete CBC and CMP are pending.  Potassium is 3 and magnesium 2 g was being administered.    Past Medical History:  Diagnosis Date   Anemia    Anxiety    Chronic back pain    Chronic shoulder pain 10 years   s/p multiple b/l rotator cuff repair sx - per MRI 04/2010 -  Delamination of the infraspinatus with mild fatty atrophysuggest distal tendon tear with  medial propagation. Depending onthe hardware, CT arthrography may add information for evaluation othe rotator cuff.f    Depression    Diabetes mellitus    Hyperlipidemia    Hypertension     Past Surgical History:  Procedure Laterality Date   BREAST REDUCTION SURGERY  1996   ROTATOR CUFF REPAIR Right 2005   ROTATOR CUFF REPAIR Right 2004   ROTATOR CUFF REPAIR Left 2007   TUBAL LIGATION  1991     Home Medications:  Prior to Admission medications   Medication Sig Start Date End Date Taking? Authorizing Provider  Accu-Chek FastClix Lancets MISC use to check blood sugar 4 times daily. 02/22/22   Ghimire, Henreitta Leber, MD  aspirin EC 81 MG tablet Take 1 tablet (81 mg total) by mouth daily. Swallow whole. 02/22/22   Ghimire, Henreitta Leber, MD  atenolol (TENORMIN) 50 MG tablet Take 50 mg by mouth every morning. 01/11/22   [provider]  Blood Glucose Monitoring Suppl (ACCU-CHEK GUIDE) w/Device KIT Use to check blood sugars up to 4 times daily 02/22/22   Ghimire, Henreitta Leber, MD  clopidogrel (PLAVIX) 75 MG tablet Take 1 tablet (75 mg total) by mouth daily. Take along with aspirin for 21 days-after 21 days stop Plavix and continue on aspirin alone. 02/22/22   Ghimire, Henreitta Leber, MD  gabapentin (NEURONTIN) 400 MG capsule Take 400 mg by mouth 2 (two) times daily. 01/11/22   [provider]  glucose blood test strip use to check blood sugar 4 times daily 02/22/22   Ghimire, Henreitta Leber, MD  ibuprofen (ADVIL) 200 MG tablet Take 400 mg by mouth 2 (two) times daily as needed (pain).    [provider]  metFORMIN (GLUCOPHAGE) 500 MG tablet Take 1 tablet (500 mg total) by mouth 2 (two) times daily with a meal. 02/22/22 02/22/23  Ghimire, Henreitta Leber, MD  METHADONE HCL PO Take 80 mg by mouth every morning.    [provider]  rosuvastatin (CRESTOR) 20 MG tablet Take 1 tablet (20 mg total) by mouth daily. 02/22/22   Ghimire, Henreitta Leber, MD    Inpatient Medications: Scheduled Meds:  fentaNYL  (SUBLIMAZE) injection  50 mcg Intravenous Once   insulin aspart  0-15 Units Subcutaneous Q4H   pantoprazole  40 mg Oral Daily   Continuous Infusions:  fentaNYL infusion INTRAVENOUS 100 mcg/hr (08/22/2022 1958)   magnesium sulfate bolus IVPB 2 g (08/20/2022 1921)   propofol Stopped (08/20/2022 1948)   propofol (DIPRIVAN) infusion 35 mcg/kg/min (09/10/2022 1947)   PRN Meds: docusate sodium, fentaNYL, polyethylene glycol, propofol  Allergies:    Allergies  Allergen Reactions   Cephalexin Swelling    REACTION: tongue swells and becomes disoriented   Aspirin Nausea And Vomiting   Codeine Itching   Ketorolac Tromethamine Nausea And Vomiting   Morphine And Related Other (See Comments)    headache    Social History:   Social History   Socioeconomic History   Marital status: Single    Spouse name: Not on file   Number of children: Not on file   Years of education: Not on file   Highest education level: Not on file  Occupational History   Occupation: on disability    Employer: UNEMPLOYED   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 Recently moved to [place] from [place] in 2011.   Social Determinants of Health   Financial Resource Strain: Not on file  Food Insecurity: Not on file  Transportation Needs: Not on file  Physical Activity: Not on file  Stress: Not on file  Social Connections: Not on file  Intimate Partner Violence: Not on file    Family History:   Family History  Problem Relation Age of Onset   Cancer Mother    Stroke Father    Colon cancer Neg Hx    Esophageal cancer Neg Hx    Rectal cancer Neg Hx     Stomach cancer Neg Hx      ROS:  Please see the history of present illness.  ROS All other ROS reviewed and negative.     Physical Exam/Data:   Vitals:   09/02/2022 1941 08/17/2022 1942 08/16/2022 1945 09/05/2022 1949  BP:  109/68 125/79   Pulse:  (!) 56 74   Resp:  18 20   Temp: (!) 96.9 F (36.1 C)     TempSrc: Temporal     SpO2:  100% 100%   Weight:    75 kg  Height:    _0  (1.651  m)   No intake or output data in the 24 hours ending 08/31/2022 2005 Filed Weights   08/16/2022 1900 08/31/2022 1949  Weight: 75 kg 75 kg   Body mass index is 27.51 kg/m.  General:  Well nourished, well developed, in no acute distress HEENT: normal Lymph: no adenopathy Neck: no JVD Endocrine:  No thryomegaly Vascular: No carotid bruits; FA pulses 2+ bilaterally without bruits  Cardiac:  normal S1, S2; RRR; no murmur Lungs:  clear to auscultation bilaterally, no wheezing, rhonchi or rales  Abd: soft, nontender, no hepatomegaly  Ext: no edema Musculoskeletal:  No deformities, BUE and BLE strength normal and equal Skin: warm and dry  Neuro:  intubated Psych:  Normal affect   EKG:  The EKG was personally reviewed and demonstrates:  New RBBB, NSR and PVCs Telemetry:  Telemetry was personally reviewed and demonstrates:  NSR               Relevant CV Studies: Echo from 02/2022 LVEF 45 to 50% Hypokinesis of inferior and inferolateral wall  Laboratory Data:  ChemistryNo results for input(s): "NA", "K", "CL", "CO2", "GLUCOSE", "BUN", "CREATININE", "CALCIUM", "GFRNONAA", "GFRAA", "ANIONGAP" in the last 168 hours.  No results for input(s): "PROT", "ALBUMIN", "AST", "ALT", "ALKPHOS", "BILITOT" in the last 168 hours. HematologyNo results for input(s): "WBC", "RBC", "HGB", "HCT", "MCV", "MCH", "MCHC", "RDW", "PLT" in the last 168 hours. Cardiac EnzymesNo results for input(s): "TROPONINI" in the last 168 hours. No results for input(s): "TROPIPOC" in the last 168 hours.  BNPNo results for  input(s): "BNP", "PROBNP" in the last 168 hours.  DDimer No results for input(s): "DDIMER" in the last 168 hours.  Radiology/Studies:  DG Chest Portable 1 View  Result Date: 09/12/2022 CLINICAL DATA:  Intubated, status post CPR EXAM: PORTABLE CHEST 1 VIEW COMPARISON:  11/29/2017 FINDINGS: Single frontal view of the chest demonstrates endotracheal tube overlying tracheal air column, tip approximately 1.5 cm above carina. External defibrillator pads overlie the chest. The cardiac silhouette is enlarged. There is diffuse interstitial and ground-glass opacities, favor edema. No effusion or pneumothorax. No displaced fractures. IMPRESSION: 1. Diffuse interstitial and ground-glass opacities, favor edema. 2. No complication after intubation. Electronically Signed   By: Randa Ngo M.D.   On: 09/01/2022 19:11    Assessment and Plan:   Patient is a 61 year old F known to have HTN, DM 2, HLD, acute CVA secondary to small vessel disease in 02/2022 presented to the ER with witnessed syncopal episode secondary to V-fib arrest (ROSC achieved in 15 minutes).  # Out-of-hospital V-fib arrest likely secondary to CAD (ROSC achieved in 15 minutes) -Echo from 6/23 showed LVEF 45 to 50% with RWMA (inferior and inferolateral hypokinesis). EKG showed new onset of RBBB, NSR and frequent PVCs but no ST elevation. Troponin is pending. I spoke with the on-call interventional cardiologist, Dr. Peter Martinique who suggested there is no emergent indication of LHC due to absence of ST elevations on EKG. Recommended LHC after patient is stable. No LHC plans overnight. Start ACS protocol, aspirin 325 mg, high intensity statin (atorvastatin 80 mg or rosuvastatin 40 mg if liver enzymes are normal) and heparin drip (if CT head ruled out any hemorrhage). Do not administer Plavix or Brilinta. -Although Amio was initially recommended, it will need to be switched to Lidocaine (bolus and maintenance) drip due to new bradycardia. -Keep K>4  and <5, Mg>2 and <3. -Trend troponins x 3 -Obtain 2D echocardiogram in the a.m. -Cardiology team will follow in the a.m. -Rest of the  care per critical care team  I have spent a total of 55 minutes with patient reviewing chart , telemetry, EKGs, labs and examining patient as well as establishing an assessment and plan that was discussed with the patient.  > 50% of time was spent in direct patient care.      For questions or updates, please contact [place] Please consult [email or web address] for contact info under Cardiology/STEMI.   Signed, Vangie Bicker, MD 09/08/2022 8:05 PM

## 2022-08-26 NOTE — Progress Notes (Addendum)
Attempted to reach family- number listed for sister was a wrong number and brother did not answer.   Met with Pt's friend, Kaylee Murphy.   Pt lives with Kaylee Murphy and his wife Kaylee Murphy who is pt's best friend.  He knows pt has a son, but is not sure about any other siblings or family and they are trying to reach her son.  He states that pt has overall been doing well, she and his wife were sitting at the table talking when she had convulsions and became unresponsive.  He does not think she has mentioned any recent chest pain or shortness of breath.  She only complains of chronic leg pain.  He believes patient would want continued life support.  He was updated with pt's condition and questions answered.   Kaylee Carpen Kateline Kinkade, Kaylee Murphy

## 2022-08-26 NOTE — Progress Notes (Signed)
ANTICOAGULATION CONSULT NOTE - Initial Consult  Pharmacy Consult for Heparin Indication: chest pain/ACS  Allergies  Allergen Reactions   Cephalexin Swelling    REACTION: tongue swells and becomes disoriented   Aspirin Nausea And Vomiting   Codeine Itching   Ketorolac Tromethamine Nausea And Vomiting   Morphine And Related Other (See Comments)    headache    Patient Measurements: Height: '5\' 5"'$  (165.1 cm) Weight: 75 kg (165 lb 5.5 oz) IBW/kg (Calculated) : 57 Heparin Dosing Weight: 72 kg  Vital Signs: Temp: 96.9 F (36.1 C) (12/14 1941) Temp Source: Temporal (12/14 1941) BP: 131/80 (12/14 2024) Pulse Rate: 66 (12/14 2024)  Labs: Recent Labs    08/25/2022 1920 08/29/2022 2007  HGB  --  12.2  HCT  --  36.0  CREATININE 0.93  --   TROPONINIHS 98*  --     Estimated Creatinine Clearance: 65.2 mL/min (by C-G formula based on SCr of 0.93 mg/dL).   Medical History: Past Medical History:  Diagnosis Date   Anemia    Anxiety    Chronic back pain    Chronic shoulder pain 10 years   s/p multiple b/l rotator cuff repair sx - per MRI 04/2010 -  Delamination of the infraspinatus with mild fatty atrophysuggest distal tendon tear with medial propagation. Depending onthe hardware, CT arthrography may add information for evaluation othe rotator cuff.f    Depression    Diabetes mellitus    Hyperlipidemia    Hypertension     Medications:  Awaiting home med rec  Assessment: 61 y.o. F presents s/p vfib cardiac arrest. To begin heparin for ACS. No AC PTA as far as we can tell from med history/refill history. CBC ok on admission. CT head negative for acute processes.  Goal of Therapy:  Heparin level 0.3-0.7 units/ml Monitor platelets by anticoagulation protocol: Yes   Plan:  Heparin IV bolus 4000 units Heparin gtt at 900 units/hr Will f/u heparin level in 6 hours Daily heparin level and CBC  Sherlon Handing, PharmD, BCPS Please see amion for complete clinical pharmacist phone  list 09/02/2022,8:59 PM

## 2022-08-26 NOTE — H&P (Addendum)
NAME:  Kaylee Murphy, MRN:  [id number], DOB:  06-11-1961, LOS: 0 ADMISSION DATE:  08/15/2022, CONSULTATION DATE:  08/31/2022 REFERRING MD:  Tegeler, CHIEF COMPLAINT:  cardiac arrest   History of Present Illness:  Kaylee Murphy is a 61 y.o. 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  History taken from Epic and report as no family is available; apparently she had seizure-like activity and EMS was called.  Pt was awake, but altered on arrival and then had a syncopal episode with loss of pulses.  Initial rhythm was Vfib. She underwent approximately 50 minutes of CPR with a run of Torsades and was intubated by EMS.   In the ED, ETT changed without sedation, then patient began moving and had purposeful reaching for tube.   At the time of PCCM consult, no labs or head CT available. She initially required an epinephrine gtt, this was weaned off.  EKG without STEMI and Cardiology was also consulted.  Pertinent  Medical History   has a past medical history of Anemia, Anxiety, Chronic back pain, Chronic shoulder pain (10 years), Depression, Diabetes mellitus, Hyperlipidemia, and Hypertension.   Significant Hospital Events: Including procedures, antibiotic start and stop dates in addition to other pertinent events   12/14 witnessed out of hospital vfib arrest, 50 mins CPR  Interim History / Subjective:  Pt initially on epinephrine gtt, weaned off pressors and hemodynamically stable  Objective   Blood pressure (!) 146/91, pulse 92, resp. rate 17, last menstrual period 02/13/2014, SpO2 100 %.       No intake or output data in the 24 hours ending 08/15/2022 1905 There were no vitals filed for this visit.  General:  well-nourished elderly F, intubated and sedated  HEENT: MM pink/moist, sclera anicteric, pupils equal and reactive to  light Neuro: examined on Propofol and Fentanyl, RASS-4,  CV: s1s2 sinus bradycardia, no m/r/g PULM:  mechanically ventilated, no significant rhonchi or wheezing and equal chest rise GI: soft, bsx4 active  Extremities: warm/dry, no edema  Skin: no rashes or lesions   Resolved Hospital Problem list     Assessment & Plan:   Witnessed, out of hospital Vfib cardiac arrest, possible Torsades Prolonged Qtc Possible seizure activity Acute Hypoxic Respiratory Failure 50 minutes CPR, but initiated by EMS and pt with purposeful movement in the ED -work-up pending, follow labs including troponin, BNP, metabolic panel -receiving Mag 2g IV -CTH -CXR -EEG, echo -hold normothermia protocol given purposeful movement, but monitor for fever -ABG --Maintain full vent support with SAT/SBT as tolerated -titrate Vent setting to maintain SpO2 greater than or equal to 90%. -HOB elevated 30 degrees. -Plateau pressures less than 30 cm H20.  -Follow chest x-ray, ABG prn.   -Bronchial hygiene and RT/bronchodilator protocol. -Cardiology consult  Type 2 DM -hold home Metformin -A1c and SSI   History of CVA -hold plavix and asa pending CT head then resume   HTN -hold home atenolol  ? Chronic Pain -hold home methadone for now     Best Practice (right click and "Reselect all SmartList Selections" daily)   Diet/type: NPO DVT prophylaxis: SCD GI prophylaxis: PPI Lines: N/A Foley:  Yes, and it is still needed Code Status:  full code Last date of multidisciplinary goals of care discussion [pending, no family available]  Labs   CBC:  No results for input(s): "WBC", "NEUTROABS", "HGB", "HCT", "MCV", "PLT" in the last 168 hours.  Basic Metabolic Panel: No results for input(s): "NA", "K", "CL", "CO2", "GLUCOSE", "BUN", "CREATININE", "CALCIUM", "MG", "PHOS" in the last 168 hours. GFR: CrCl cannot be calculated (Patient's most recent lab result is older than the maximum 21 days allowed.). No  results for input(s): "PROCALCITON", "WBC", "LATICACIDVEN" in the last 168 hours.  Liver Function Tests: No results for input(s): "AST", "ALT", "ALKPHOS", "BILITOT", "PROT", "ALBUMIN" in the last 168 hours. No results for input(s): "LIPASE", "AMYLASE" in the last 168 hours. No results for input(s): "AMMONIA" in the last 168 hours.  ABG    Component Value Date/Time   TCO2 28 02/16/2022 0318     Coagulation Profile: No results for input(s): "INR", "PROTIME" in the last 168 hours.  Cardiac Enzymes: No results for input(s): "CKTOTAL", "CKMB", "CKMBINDEX", "TROPONINI" in the last 168 hours.  HbA1C: Hemoglobin A1C  Date/Time Value Ref Range Status  01/30/2013 09:58 AM 6.1  Final  10/20/2011 08:59 AM 6.9  Final   Hgb A1c MFr Bld  Date/Time Value Ref Range Status  02/19/2022 03:30 PM 6.8 (H) 4.8 - 5.6 % Final    Comment:    (NOTE) Pre diabetes:          5.7%-6.4%  Diabetes:              >6.4%  Glycemic control for   <7.0% adults with diabetes   08/24/2010 02:32 PM 7.0 %     CBG: No results for input(s): "GLUCAP" in the last 168 hours.  Review of Systems:   Unable to obtain  Past Medical History:  She,  has a past medical history of Anemia, Anxiety, Chronic back pain, Chronic shoulder pain (10 years), Depression, Diabetes mellitus, Hyperlipidemia, and Hypertension.   Surgical History:   Past Surgical History:  Procedure Laterality Date   BREAST REDUCTION SURGERY  1996   ROTATOR CUFF REPAIR Right 2005   ROTATOR CUFF REPAIR Right 2004   ROTATOR CUFF REPAIR Left 2007   TUBAL LIGATION  1991     Social History:   reports that she has been smoking cigarettes. She has been smoking an average of .25 packs per day. She has never used smokeless tobacco. She reports that she does not drink alcohol and does not use drugs.   Family History:  Her family history includes Cancer in her mother; Stroke in her father. There is no history of Colon cancer, Esophageal cancer, Rectal  cancer, or Stomach cancer.   Allergies Allergies  Allergen Reactions   Cephalexin Swelling    REACTION: tongue swells and becomes disoriented   Aspirin Nausea And Vomiting   Codeine Itching   Ketorolac Tromethamine Nausea And Vomiting   Morphine And Related Other (See Comments)    headache     Home Medications  Prior to Admission medications   Medication Sig Start Date End Date Taking? Authorizing Provider  Accu-Chek FastClix Lancets MISC use to check blood sugar 4 times daily. 02/22/22   Ghimire, Henreitta Leber, MD  aspirin EC 81 MG tablet Take 1 tablet (81 mg total) by mouth daily. Swallow whole. 02/22/22   Ghimire, Henreitta Leber, MD  atenolol (TENORMIN) 50 MG tablet Take 50 mg by mouth every morning. 01/11/22   [provider]  Blood Glucose Monitoring Suppl (ACCU-CHEK GUIDE) w/Device KIT Use to check blood sugars up to 4 times daily 02/22/22   Ghimire, Henreitta Leber, MD  clopidogrel (PLAVIX) 75 MG tablet Take  1 tablet (75 mg total) by mouth daily. Take along with aspirin for 21 days-after 21 days stop Plavix and continue on aspirin alone. 02/22/22   Ghimire, Henreitta Leber, MD  gabapentin (NEURONTIN) 400 MG capsule Take 400 mg by mouth 2 (two) times daily. 01/11/22   [provider]  glucose blood test strip use to check blood sugar 4 times daily 02/22/22   Ghimire, Henreitta Leber, MD  ibuprofen (ADVIL) 200 MG tablet Take 400 mg by mouth 2 (two) times daily as needed (pain).    [provider]  metFORMIN (GLUCOPHAGE) 500 MG tablet Take 1 tablet (500 mg total) by mouth 2 (two) times daily with a meal. 02/22/22 02/22/23  Ghimire, Henreitta Leber, MD  METHADONE HCL PO Take 80 mg by mouth every morning.    [provider]  rosuvastatin (CRESTOR) 20 MG tablet Take 1 tablet (20 mg total) by mouth daily. 02/22/22   Jonetta Osgood, MD     Critical care time:  40 minutes      CRITICAL CARE Performed by: Otilio Carpen Antanisha Mohs   Total critical care time: 40 minutes  Critical care time was  exclusive of separately billable procedures and treating other patients.  Critical care was necessary to treat or prevent imminent or life-threatening deterioration.  Critical care was time spent personally by me on the following activities: development of treatment plan with patient and/or surrogate as well as nursing, discussions with consultants, evaluation of patient's response to treatment, examination of patient, obtaining history from patient or surrogate, ordering and performing treatments and interventions, ordering and review of laboratory studies, ordering and review of radiographic studies, pulse oximetry and re-evaluation of patient's condition.   Otilio Carpen Jathan Balling, PA-C [facility] See Amion for pager If no response to pager , please call [phone] until 7pm After 7:00 pm call Elink  [phone]

## 2022-08-26 NOTE — ED Provider Notes (Signed)
[facility] EMERGENCY DEPARTMENT Provider Note   CSN: [id number] Arrival date & time: 08/16/2022  1850     History  Chief Complaint  Patient presents with   Post CPR    Kaylee Murphy is a 61 y.o. female.  HPI  The patient is a 61 year old female with past medical history of T2DM, HTN, HLD CVA presenting by EMS out-of-hospital cardiac arrest.  EMS reported regularly being called to the scene after a seizure.  Shortly after their arrival the patient had a witnessed syncopal episode and was found to be pulseless.  She was in A-fib at that time with progression to torsades.  Patient was defibrillated 3 times and received 3 doses of epinephrine with a total downtime approximately 15 minutes.  She was given 300 mg of amiodarone and 100 mcg of fentanyl with 2.5 Versed prior to arrival.  She was started on epi drip 4 mcg/min just prior to arrival.  The patient arrived with a King airway in place and a GCS of 3T.    Home Medications Prior to Admission medications   Medication Sig Start Date End Date Taking? Authorizing Provider  Accu-Chek FastClix Lancets MISC use to check blood sugar 4 times daily. 02/22/22   Ghimire, Henreitta Leber, MD  aspirin EC 81 MG tablet Take 1 tablet (81 mg total) by mouth daily. Swallow whole. 02/22/22   Ghimire, Henreitta Leber, MD  atenolol (TENORMIN) 50 MG tablet Take 50 mg by mouth every morning. 01/11/22   [provider]  Blood Glucose Monitoring Suppl (ACCU-CHEK GUIDE) w/Device KIT Use to check blood sugars up to 4 times daily 02/22/22   Ghimire, Henreitta Leber, MD  clopidogrel (PLAVIX) 75 MG tablet Take 1 tablet (75 mg total) by mouth daily. Take along with aspirin for 21 days-after 21 days stop Plavix and continue on aspirin alone. 02/22/22   Ghimire, Henreitta Leber, MD  gabapentin (NEURONTIN) 400 MG capsule Take 400 mg by mouth 2 (two) times daily. 01/11/22   [provider]  glucose blood test strip use to check blood sugar 4 times daily 02/22/22    Ghimire, Henreitta Leber, MD  ibuprofen (ADVIL) 200 MG tablet Take 400 mg by mouth 2 (two) times daily as needed (pain).    [provider]  metFORMIN (GLUCOPHAGE) 500 MG tablet Take 1 tablet (500 mg total) by mouth 2 (two) times daily with a meal. 02/22/22 02/22/23  Ghimire, Henreitta Leber, MD  METHADONE HCL PO Take 80 mg by mouth every morning.    [provider]  rosuvastatin (CRESTOR) 20 MG tablet Take 1 tablet (20 mg total) by mouth daily. 02/22/22   Ghimire, Henreitta Leber, MD      Allergies    Cephalexin, Aspirin, Codeine, Ketorolac tromethamine, and Morphine and related    Review of Systems   Review of Systems  See HPI  Physical Exam Updated Vital Signs BP (!) 171/71   Pulse 71   Temp (!) 96.9 F (36.1 C) (Temporal)   Resp (!) 22   Ht _0  (1.651 m)   Wt 75 kg   LMP 02/13/2014   SpO2 100%   BMI 27.51 kg/m  Physical Exam Vitals and nursing note reviewed.  Constitutional:      General: She is in acute distress.     Appearance: She is well-developed. She is ill-appearing.     Comments: GCS 3  HENT:     Head: Normocephalic and atraumatic.     Nose: Nose normal.  Mouth/Throat:     Mouth: Mucous membranes are dry.  Eyes:     Comments: Pupils 2 mm minimally reactive.  Cardiovascular:     Rate and Rhythm: Normal rate and regular rhythm.     Heart sounds: No murmur heard. Abdominal:     Palpations: Abdomen is soft.     Tenderness: There is no abdominal tenderness.  Musculoskeletal:        General: No swelling.  Skin:    General: Skin is warm and dry.     ED Results / Procedures / Treatments   Labs (all labs ordered are listed, but only abnormal results are displayed) Labs Reviewed  COMPREHENSIVE METABOLIC PANEL - Abnormal; Notable for the following components:      Result Value   Potassium 3.2 (*)    CO2 16 (*)    Glucose, Bld 267 (*)    Calcium 7.4 (*)    Total Protein 6.1 (*)    Albumin 2.7 (*)    AST 58 (*)    Total Bilirubin 0.2 (*)    All  other components within normal limits  RAPID URINE DRUG SCREEN, HOSP PERFORMED - Abnormal; Notable for the following components:   Benzodiazepines POSITIVE (*)    All other components within normal limits  PROTIME-INR - Abnormal; Notable for the following components:   Prothrombin Time 16.1 (*)    INR 1.3 (*)    All other components within normal limits  LACTIC ACID, PLASMA - Abnormal; Notable for the following components:   Lactic Acid, Venous 3.1 (*)    All other components within normal limits  CBC WITH DIFFERENTIAL/PLATELET - Abnormal; Notable for the following components:   WBC 27.4 (*)    Hemoglobin 11.6 (*)    Neutro Abs 21.1 (*)    Monocytes Absolute 2.0 (*)    Abs Immature Granulocytes 0.43 (*)    All other components within normal limits  BRAIN NATRIURETIC PEPTIDE - Abnormal; Notable for the following components:   B Natriuretic Peptide 890.8 (*)    All other components within normal limits  I-STAT ARTERIAL BLOOD GAS, ED - Abnormal; Notable for the following components:   pH, Arterial 7.255 (*)    pO2, Arterial 233 (*)    TCO2 21 (*)    Acid-base deficit 7.0 (*)    Potassium 3.0 (*)    All other components within normal limits  CBG MONITORING, ED - Abnormal; Notable for the following components:   Glucose-Capillary 205 (*)    All other components within normal limits  CBG MONITORING, ED - Abnormal; Notable for the following components:   Glucose-Capillary 212 (*)    All other components within normal limits  TROPONIN I (HIGH SENSITIVITY) - Abnormal; Notable for the following components:   Troponin I (High Sensitivity) 98 (*)    All other components within normal limits  TROPONIN I (HIGH SENSITIVITY) - Abnormal; Notable for the following components:   Troponin I (High Sensitivity) 141 (*)    All other components within normal limits  CULTURE, BLOOD (ROUTINE X 2)  CULTURE, BLOOD (ROUTINE X 2)  MRSA NEXT GEN BY PCR, NASAL  RESPIRATORY PANEL BY PCR  MAGNESIUM  TSH   ETHANOL  CBC WITH DIFFERENTIAL/PLATELET  TRIGLYCERIDES  CBC  BASIC METABOLIC PANEL  BLOOD GAS, ARTERIAL  MAGNESIUM  PHOSPHORUS  HEMOGLOBIN A1C  BLOOD GAS, ARTERIAL  CBC WITH DIFFERENTIAL/PLATELET  STREP PNEUMONIAE URINARY ANTIGEN  LEGIONELLA PNEUMOPHILA SEROGP 1 UR AG  HEPARIN LEVEL (UNFRACTIONATED)  TYPE AND SCREEN  ABO/RH    EKG EKG Interpretation  Date/Time:  Thursday August 26 2022 19:54:58 EST Ventricular Rate:  60 PR Interval:  163 QRS Duration: 161 QT Interval:  536 QTC Calculation: 536 R Axis:   132 Text Interpretation: Sinus rhythm Probable left atrial enlargement RBBB and LPFB when compared to prior, now RBBB No STEMI Confirmed by Antony Blackbird [phone]) on 09/04/2022 9:34:16 PM  Radiology DG Abd Portable 1V  Result Date: 09/11/2022 CLINICAL DATA:  Orogastric tube placement. EXAM: PORTABLE ABDOMEN - 1 VIEW COMPARISON:  None Available. FINDINGS: Tip and side port of the enteric tube below the diaphragm in the stomach. Moderate stool in the included colon. Nonobstructive upper abdominal bowel gas pattern. IMPRESSION: Tip and side port of the enteric tube below the diaphragm in the stomach. Electronically Signed   By: Keith Rake M.D.   On: 08/17/2022 21:01   CT HEAD WO CONTRAST (5MM)  Result Date: 08/20/2022 CLINICAL DATA:  Seizure EXAM: CT HEAD WITHOUT CONTRAST TECHNIQUE: Contiguous axial images were obtained from the base of the skull through the vertex without intravenous contrast. RADIATION DOSE REDUCTION: This exam was performed according to the departmental dose-optimization program which includes automated exposure control, adjustment of the mA and/or kV according to patient size and/or use of iterative reconstruction technique. COMPARISON:  02/19/2022 FINDINGS: Brain: No acute infarct or hemorrhage. Lateral ventricles and midline structures are unremarkable. No acute extra-axial fluid collections. No mass effect. Vascular: No hyperdense vessel or  unexpected calcification. Skull: Normal. Negative for fracture or focal lesion. Sinuses/Orbits: No acute finding. Other: None. IMPRESSION: 1. No acute intracranial process. Electronically Signed   By: Randa Ngo M.D.   On: 08/30/2022 20:27   DG Chest Portable 1 View  Result Date: 08/16/2022 CLINICAL DATA:  Intubated, status post CPR EXAM: PORTABLE CHEST 1 VIEW COMPARISON:  11/29/2017 FINDINGS: Single frontal view of the chest demonstrates endotracheal tube overlying tracheal air column, tip approximately 1.5 cm above carina. External defibrillator pads overlie the chest. The cardiac silhouette is enlarged. There is diffuse interstitial and ground-glass opacities, favor edema. No effusion or pneumothorax. No displaced fractures. IMPRESSION: 1. Diffuse interstitial and ground-glass opacities, favor edema. 2. No complication after intubation. Electronically Signed   By: Randa Ngo M.D.   On: 08/14/2022 19:11    Procedures Procedure Name: Intubation Date/Time: 09/08/2022 7:15 PM  Performed by: Dani Gobble, MDPre-anesthesia Checklist: Suction available, Timeout performed, Patient being monitored, Patient identified and Emergency Drugs available Oxygen Delivery Method: Ambu bag Preoxygenation: Pre-oxygenation with 100% oxygen Laryngoscope Size: Glidescope and 4 Grade View: Grade II Tube size: 7.5 mm Number of attempts: 1 Airway Equipment and Method: Rigid stylet and Video-laryngoscopy Placement Confirmation: ETT inserted through vocal cords under direct vision, Positive ETCO2, CO2 detector and Breath sounds checked- equal and bilateral Secured at: 25 (At the lips) cm        Medications Ordered in ED Medications  propofol (DIPRIVAN) 1000 MG/100ML infusion (0 mcg/kg/min  75 kg (Order-Specific) Intravenous Stopped 08/25/2022 1948)  propofol (DIPRIVAN) 1000 MG/100ML infusion (0 mcg/kg/min  75 kg Intravenous Stopped 09/05/2022 2203)  docusate sodium (COLACE) capsule 100 mg (has no  administration in time range)  polyethylene glycol (MIRALAX / GLYCOLAX) packet 17 g (has no administration in time range)  pantoprazole (PROTONIX) EC tablet 40 mg (0 mg Oral Hold 08/13/2022 2145)  fentaNYL (SUBLIMAZE) injection 50 mcg (0 mcg Intravenous Hold 08/24/2022 2027)  fentaNYL 2573mg in NS 2544m(1041mml) infusion-PREMIX (200 mcg/hr Intravenous Rate/Dose Change 09/06/2022 2159)  fentaNYL (SUBLIMAZE) bolus via  infusion 50-100 mcg (has no administration in time range)  insulin aspart (novoLOG) injection 0-15 Units (0 Units Subcutaneous Hold 09/05/2022 2324)  doxycycline (VIBRAMYCIN) 100 mg in sodium chloride 0.9 % 250 mL IVPB (100 mg Intravenous New Bag/Given 09/04/2022 2307)  heparin ADULT infusion 100 units/mL (25000 units/288m) (900 Units/hr Intravenous New Bag/Given 08/28/2022 2136)  potassium chloride 10 mEq in 100 mL IVPB (10 mEq Intravenous New Bag/Given 08/27/22 0045)  midazolam (VERSED) injection 1-2 mg (2 mg Intravenous Given 08/27/22 0041)  lidocaine (XYLOCAINE) 4 mg/mL bolus via infusion 100 mg (100 mg Intravenous Bolus from Bag 08/17/2022 2304)    And  lidocaine (cardiac) 2000 mg in dextrose 5% 500 mL (453mmL) IV infusion (1 mg/min Intravenous New Bag/Given 08/31/2022 2304)  magnesium sulfate IVPB 2 g 50 mL (0 g Intravenous Stopped 08/16/2022 2028)  fentaNYL (SUBLIMAZE) injection 50 mcg (50 mcg Intravenous Given 09/08/2022 1912)  aspirin tablet 325 mg (325 mg Per Tube Given 08/28/2022 2235)  heparin bolus via infusion 4,000 Units (4,000 Units Intravenous Bolus from Bag 08/13/2022 2136)  magnesium sulfate IVPB 2 g 50 mL (0 g Intravenous Stopped 08/23/2022 2241)  calcium gluconate 1 g/ 50 mL sodium chloride IVPB (0 mg Intravenous Stopped 09/01/2022 2241)    ED Course/ Medical Decision Making/ A&P                           Medical Decision Making  The patient is a [age]ear old female with past medical history of T2DM, HTN, HLD CVA presenting by EMS out-of-hospital cardiac arrest.  The differential  diagnosis considered include: Electrolyte abnormality, toxic ingestion, hypoxic respiratory failure sepsis acidosis, hypothermia, trauma, ACS, PE.  On initial physical exam, patient had a GCS of 3T.  On arrival, a manual blood pressure was obtained and the patient was connected to the monitor.  [facility] in accordance with the procedure section of this note above.  The patient's EKG strip showed right bundle branch block with frequent PVCs.  Labs were drawn which included CBC with white blood cell count elevated to 27.4.6; lactic acid 3.1; glucose 212; UDS positive for benzodiazepines; troponin elevated to 141; PT/INR with PT of 16.1 and INR; i-STAT ABG with pH of 7.255, pCO2 of 44.5, and pO2 233 CMP with potassium 3.2, sodium 141 albumin 2.7, AST 58.  The patient received a chest x-ray which showed diffuse opacities favored to be edema; and a CT head without acute intercranial abnormality.  Cardiology was consulted for further management and patient was admitted to the ICU for cardiac arrest unknown etiology.  Amount and/or Complexity of Data Reviewed Independent Historian: EMS External Data Reviewed: labs and notes. Labs: ordered. Decision-making details documented in ED Course. Radiology: ordered and independent interpretation performed. Decision-making details documented in ED Course. ECG/medicine tests: ordered and independent interpretation performed. Decision-making details documented in ED Course. Discussion of management or test interpretation with external provider(s): Cardiology,  Risk Prescription drug management. Decision regarding hospitalization.   Patient's presentation is most consistent with acute presentation with potential threat to life or bodily function.         Final Clinical Impression(s) / ED Diagnoses Final diagnoses:  [facility] Vent[facility] ([facility]   Rx /  DC Orders ED Discharge Orders     None         ChDani GobbleMD 08/27/22 0059    Tegeler, ChGwenyth AllegraMD  08/27/22 1502

## 2022-08-26 NOTE — ED Triage Notes (Signed)
Pt bib gcems from home. Original call out was for seizure like activity - pt seemed to be post ictal on fire arrival. Witnessed syncopal episode and no pulses found by ems. Original rhythm v-fib then torsades. Defib x3. Epi x3. Total downtime 15 minutes. '300mg'$  amio, 12mg fent, 2.5 versed given PTA. Epi drip at 425m/min infusing on arrival. Pulses present on arrival. [facility].

## 2022-08-26 NOTE — Progress Notes (Signed)
RT NOTE:  Pt transported to CT and back to Trauma A without event.

## 2022-08-27 ENCOUNTER — Inpatient Hospital Stay (HOSPITAL_COMMUNITY): Payer: Medicare Other

## 2022-08-27 ENCOUNTER — Inpatient Hospital Stay: Payer: Self-pay

## 2022-08-27 DIAGNOSIS — R569 Unspecified convulsions: Secondary | ICD-10-CM

## 2022-08-27 DIAGNOSIS — I4721 Torsades de pointes: Secondary | ICD-10-CM

## 2022-08-27 DIAGNOSIS — G931 Anoxic brain damage, not elsewhere classified: Secondary | ICD-10-CM

## 2022-08-27 DIAGNOSIS — I469 Cardiac arrest, cause unspecified: Secondary | ICD-10-CM | POA: Diagnosis not present

## 2022-08-27 DIAGNOSIS — J9601 Acute respiratory failure with hypoxia: Secondary | ICD-10-CM

## 2022-08-27 LAB — BASIC METABOLIC PANEL
Anion gap: 12 (ref 5–15)
BUN: 10 mg/dL (ref 6–20)
CO2: 20 mmol/L — ABNORMAL LOW (ref 22–32)
Calcium: 8.3 mg/dL — ABNORMAL LOW (ref 8.9–10.3)
Chloride: 106 mmol/L (ref 98–111)
Creatinine, Ser: 0.88 mg/dL (ref 0.44–1.00)
GFR, Estimated: >60 mL/min (ref >60)
Glucose, Bld: 137 mg/dL — ABNORMAL HIGH (ref 70–99)
Potassium: 3.7 mmol/L (ref 3.5–5.1)
Sodium: 138 mmol/L (ref 135–145)

## 2022-08-27 LAB — RESPIRATORY PANEL BY PCR

## 2022-08-27 LAB — CBC
HCT: 35.3 % — ABNORMAL LOW (ref 36.0–46.0)
Hemoglobin: 11.5 g/dL — ABNORMAL LOW (ref 12.0–15.0)
MCH: 26.8 pg (ref 26.0–34.0)
MCHC: 32.6 g/dL (ref 30.0–36.0)
MCV: 82.3 fL (ref 80.0–100.0)
Platelets: 367 10*3/uL (ref 150–400)
RBC: 4.29 MIL/uL (ref 3.87–5.11)
RDW: 14.6 % (ref 11.5–15.5)
WBC: 27.9 10*3/uL — ABNORMAL HIGH (ref 4.0–10.5)
nRBC: 0 % (ref 0.0–0.2)

## 2022-08-27 LAB — PHOSPHORUS
Phosphorus: 2.6 mg/dL (ref 2.5–4.6)
Phosphorus: 2 mg/dL — ABNORMAL LOW (ref 2.5–4.6)

## 2022-08-27 LAB — POCT I-STAT 7, (LYTES, BLD GAS, ICA,H+H)
Acid-base deficit: 5 mmol/L — ABNORMAL HIGH (ref 0.0–2.0)
Bicarbonate: 21.3 mmol/L (ref 20.0–28.0)
Calcium, Ion: 1.17 mmol/L (ref 1.15–1.40)
HCT: 34 % — ABNORMAL LOW (ref 36.0–46.0)
Hemoglobin: 11.6 g/dL — ABNORMAL LOW (ref 12.0–15.0)
O2 Saturation: 99 %
Patient temperature: 98.2
Potassium: 3.6 mmol/L (ref 3.5–5.1)
Sodium: 140 mmol/L (ref 135–145)
TCO2: 23 mmol/L (ref 22–32)
pCO2 arterial: 41 mmHg (ref 32–48)
pH, Arterial: 7.322 — ABNORMAL LOW (ref 7.35–7.45)
pO2, Arterial: 142 mmHg — ABNORMAL HIGH (ref 83–108)

## 2022-08-27 LAB — LACTIC ACID, PLASMA: Lactic Acid, Venous: 1.5 mmol/L (ref 0.5–1.9)

## 2022-08-27 LAB — HEPARIN LEVEL (UNFRACTIONATED)
Heparin Unfractionated: 0.37 IU/mL (ref 0.30–0.70)
Heparin Unfractionated: 0.37 IU/mL (ref 0.30–0.70)

## 2022-08-27 LAB — GLUCOSE, CAPILLARY
Glucose-Capillary: 194 mg/dL — ABNORMAL HIGH (ref 70–99)
Glucose-Capillary: 185 mg/dL — ABNORMAL HIGH (ref 70–99)
Glucose-Capillary: 103 mg/dL — ABNORMAL HIGH (ref 70–99)
Glucose-Capillary: 151 mg/dL — ABNORMAL HIGH (ref 70–99)
Glucose-Capillary: 101 mg/dL — ABNORMAL HIGH (ref 70–99)
Glucose-Capillary: 132 mg/dL — ABNORMAL HIGH (ref 70–99)

## 2022-08-27 LAB — ECHOCARDIOGRAM COMPLETE
Area-P 1/2: 2.71 cm2
Height: 65 in
S' Lateral: 4 cm
Single Plane A4C EF: 26.3 %
Weight: 2264.57 oz

## 2022-08-27 LAB — MRSA NEXT GEN BY PCR, NASAL: MRSA by PCR Next Gen: NOT DETECTED

## 2022-08-27 LAB — TRIGLYCERIDES: Triglycerides: 56 mg/dL (ref <150)

## 2022-08-27 LAB — MAGNESIUM
Magnesium: 2.2 mg/dL (ref 1.7–2.4)
Magnesium: 1.9 mg/dL (ref 1.7–2.4)

## 2022-08-27 LAB — ABO/RH: ABO/RH(D): O POS

## 2022-08-27 LAB — BRAIN NATRIURETIC PEPTIDE: B Natriuretic Peptide: 890.8 pg/mL — ABNORMAL HIGH (ref 0.0–100.0)

## 2022-08-27 MED ORDER — LEVETIRACETAM IN NACL 1000 MG/100ML IV SOLN
1000.0000 mg | INTRAVENOUS | Status: AC
  Administered 2022-08-27: 1000 mg via INTRAVENOUS
  Filled 2022-08-27: qty 100

## 2022-08-27 MED ORDER — SODIUM CHLORIDE 0.9% FLUSH
10.0000 mL | Freq: Two times a day (BID) | INTRAVENOUS | Status: DC
  Administered 2022-08-27: 10 mL
  Administered 2022-08-27: 30 mL
  Administered 2022-08-28 – 2022-08-29 (×4): 10 mL
  Administered 2022-08-30: 30 mL
  Administered 2022-08-31 – 2022-09-05 (×11): 10 mL
  Administered 2022-09-05: 40 mL
  Administered 2022-09-06: 10 mL
  Administered 2022-09-06: 40 mL
  Administered 2022-09-07: 10 mL

## 2022-08-27 MED ORDER — CHLORHEXIDINE GLUCONATE CLOTH 2 % EX PADS
6.0000 | MEDICATED_PAD | Freq: Every day | CUTANEOUS | Status: DC
  Administered 2022-08-27 – 2022-09-07 (×14): 6 via TOPICAL

## 2022-08-27 MED ORDER — VALPROATE SODIUM 100 MG/ML IV SOLN
500.0000 mg | Freq: Three times a day (TID) | INTRAVENOUS | Status: DC
  Filled 2022-08-27 (×3): qty 5

## 2022-08-27 MED ORDER — LEVETIRACETAM IN NACL 1000 MG/100ML IV SOLN
1000.0000 mg | Freq: Two times a day (BID) | INTRAVENOUS | Status: DC
  Administered 2022-08-27 – 2022-09-07 (×22): 1000 mg via INTRAVENOUS
  Filled 2022-08-27 (×22): qty 100

## 2022-08-27 MED ORDER — SODIUM CHLORIDE 0.9 % IV SOLN: 2000.0000 mg | INTRAVENOUS | Status: DC

## 2022-08-27 MED ORDER — NOREPINEPHRINE 4 MG/250ML-% IV SOLN
0.0000 ug/min | INTRAVENOUS | Status: DC
  Administered 2022-08-28 – 2022-08-30 (×3): 2 ug/min via INTRAVENOUS
  Filled 2022-08-27 (×3): qty 250

## 2022-08-27 MED ORDER — PANTOPRAZOLE SODIUM 40 MG IV SOLR
40.0000 mg | Freq: Every day | INTRAVENOUS | Status: DC
  Administered 2022-08-27 – 2022-09-07 (×12): 40 mg via INTRAVENOUS
  Filled 2022-08-27 (×12): qty 10

## 2022-08-27 MED ORDER — PHENYLEPHRINE HCL-NACL 20-0.9 MG/250ML-% IV SOLN: 0.0000 ug/min | INTRAVENOUS | Status: DC

## 2022-08-27 MED ORDER — ORAL CARE MOUTH RINSE
15.0000 mL | OROMUCOSAL | Status: DC
  Administered 2022-08-27 – 2022-09-07 (×136): 15 mL via OROMUCOSAL

## 2022-08-27 MED ORDER — MIDAZOLAM-SODIUM CHLORIDE 100-0.9 MG/100ML-% IV SOLN
10.0000 mg/h | INTRAVENOUS | Status: DC
  Administered 2022-08-27 – 2022-08-30 (×7): 10 mg/h via INTRAVENOUS
  Administered 2022-08-30: 8 mg/h via INTRAVENOUS
  Administered 2022-08-31 – 2022-09-01 (×2): 4 mg/h via INTRAVENOUS
  Administered 2022-09-02: 10 mg/h via INTRAVENOUS
  Administered 2022-09-03: 4 mg/h via INTRAVENOUS
  Administered 2022-09-04: 10 mg/h via INTRAVENOUS
  Administered 2022-09-05: 4 mg/h via INTRAVENOUS
  Administered 2022-09-05: 5 mg/h via INTRAVENOUS
  Administered 2022-09-06: 8 mg/h via INTRAVENOUS
  Filled 2022-08-27 (×16): qty 100

## 2022-08-27 MED ORDER — SODIUM CHLORIDE 0.9% FLUSH: 10.0000 mL | INTRAVENOUS | Status: DC | PRN

## 2022-08-27 MED ORDER — VALPROATE SODIUM 100 MG/ML IV SOLN
1250.0000 mg | Freq: Once | INTRAVENOUS | Status: AC
  Administered 2022-08-27: 1250 mg via INTRAVENOUS
  Filled 2022-08-27: qty 12.5

## 2022-08-27 MED ORDER — SODIUM CHLORIDE 0.9 % IV SOLN
2000.0000 mg | Freq: Once | INTRAVENOUS | Status: AC
  Administered 2022-08-27: 2000 mg via INTRAVENOUS
  Filled 2022-08-27: qty 20

## 2022-08-27 MED ORDER — VITAL HIGH PROTEIN PO LIQD
1000.0000 mL | ORAL | Status: DC
  Administered 2022-08-27: 1000 mL

## 2022-08-27 MED ORDER — PROSOURCE TF20 ENFIT COMPATIBL EN LIQD
60.0000 mL | Freq: Every day | ENTERAL | Status: DC
  Administered 2022-08-27: 60 mL
  Filled 2022-08-27: qty 60

## 2022-08-27 MED ORDER — MIDAZOLAM BOLUS VIA INFUSION
10.0000 mg | Freq: Once | INTRAVENOUS | Status: AC
  Administered 2022-08-27: 10 mg via INTRAVENOUS
  Filled 2022-08-27: qty 10

## 2022-08-27 MED ORDER — ORAL CARE MOUTH RINSE: 15.0000 mL | OROMUCOSAL | Status: DC | PRN

## 2022-08-27 MED ORDER — VALPROATE SODIUM 100 MG/ML IV SOLN
500.0000 mg | Freq: Three times a day (TID) | INTRAVENOUS | Status: DC
  Administered 2022-08-27 – 2022-09-07 (×33): 500 mg via INTRAVENOUS
  Filled 2022-08-27 (×36): qty 5

## 2022-08-27 MED ORDER — VALPROATE SODIUM 100 MG/ML IV SOLN: 500.0000 mg | Freq: Three times a day (TID) | INTRAVENOUS | Status: DC

## 2022-08-27 MED ORDER — PROPOFOL 1000 MG/100ML IV EMUL
5.0000 ug/kg/min | INTRAVENOUS | Status: DC
  Administered 2022-08-27: 120 ug/kg/min via INTRAVENOUS
  Administered 2022-08-27: 100 ug/kg/min via INTRAVENOUS
  Administered 2022-08-27 (×3): 120 ug/kg/min via INTRAVENOUS
  Administered 2022-08-27: 100 ug/kg/min via INTRAVENOUS
  Administered 2022-08-27 – 2022-08-30 (×26): 120 ug/kg/min via INTRAVENOUS
  Administered 2022-08-30: 70 ug/kg/min via INTRAVENOUS
  Administered 2022-08-30: 120 ug/kg/min via INTRAVENOUS
  Administered 2022-08-30: 80 ug/kg/min via INTRAVENOUS
  Administered 2022-08-30 (×2): 120 ug/kg/min via INTRAVENOUS
  Administered 2022-08-31: 20 ug/kg/min via INTRAVENOUS
  Administered 2022-08-31 – 2022-09-02 (×11): 40 ug/kg/min via INTRAVENOUS
  Administered 2022-09-03: 80 ug/kg/min via INTRAVENOUS
  Administered 2022-09-03: 40 ug/kg/min via INTRAVENOUS
  Administered 2022-09-03 – 2022-09-04 (×8): 80 ug/kg/min via INTRAVENOUS
  Administered 2022-09-04 (×2): 90 ug/kg/min via INTRAVENOUS
  Administered 2022-09-04: 60 ug/kg/min via INTRAVENOUS
  Administered 2022-09-04 – 2022-09-05 (×6): 90 ug/kg/min via INTRAVENOUS
  Administered 2022-09-05 (×3): 75 ug/kg/min via INTRAVENOUS
  Administered 2022-09-05 (×3): 90 ug/kg/min via INTRAVENOUS
  Administered 2022-09-05: 80 ug/kg/min via INTRAVENOUS
  Administered 2022-09-06: 70 ug/kg/min via INTRAVENOUS
  Administered 2022-09-06: 60 ug/kg/min via INTRAVENOUS
  Administered 2022-09-06: 68 ug/kg/min via INTRAVENOUS
  Administered 2022-09-06: 55 ug/kg/min via INTRAVENOUS
  Administered 2022-09-06: 70 ug/kg/min via INTRAVENOUS
  Administered 2022-09-07: 80 ug/kg/min via INTRAVENOUS
  Administered 2022-09-07: 120 ug/kg/min via INTRAVENOUS
  Administered 2022-09-07 (×3): 80 ug/kg/min via INTRAVENOUS
  Filled 2022-08-27 (×2): qty 100
  Filled 2022-08-27: qty 200
  Filled 2022-08-27 (×6): qty 100
  Filled 2022-08-27: qty 200
  Filled 2022-08-27 (×7): qty 100
  Filled 2022-08-27: qty 200
  Filled 2022-08-27 (×4): qty 100
  Filled 2022-08-27 (×2): qty 200
  Filled 2022-08-27 (×8): qty 100
  Filled 2022-08-27: qty 200
  Filled 2022-08-27 (×4): qty 100
  Filled 2022-08-27: qty 200
  Filled 2022-08-27 (×18): qty 100
  Filled 2022-08-27: qty 200
  Filled 2022-08-27 (×6): qty 100
  Filled 2022-08-27: qty 200
  Filled 2022-08-27 (×4): qty 100
  Filled 2022-08-27: qty 200
  Filled 2022-08-27 (×8): qty 100

## 2022-08-27 MED ORDER — VITAL AF 1.2 CAL PO LIQD
1000.0000 mL | ORAL | Status: DC
  Administered 2022-08-27 – 2022-09-05 (×14): 1000 mL

## 2022-08-27 MED ORDER — SODIUM CHLORIDE 0.9 % IV SOLN: 2.0000 g | INTRAVENOUS | Status: DC

## 2022-08-27 NOTE — Progress Notes (Addendum)
[facility] Progress Note Patient Name: Shaquira Moroz DOB: 20-Jun-1961 MRN: [id number]   Date of Service  08/27/2022  HPI/Events of Note  Patient is post-cardiac arrest with coma, respiratory failure, and myoclonic seizures, Dr Amie Portland of neurology has ordered Keppra + Depakote + up-titration of Propofol.  eICU Interventions  New Patient Evaluation. Will order Phenylephrine gtt so that it's available for BP support, if she becomes hypotensive from the Propofol gtt.        Kerry Kass Lafonda Patron 08/27/2022, 5:48 AM

## 2022-08-27 NOTE — Progress Notes (Signed)
Peripherally Inserted Central Catheter Placement  The IV Nurse has discussed with the patient and/or persons authorized to consent for the patient, the purpose of this procedure and the potential benefits and risks involved with this procedure.  The benefits include less needle sticks, lab draws from the catheter, and the patient may be discharged home with the catheter. Risks include, but not limited to, infection, bleeding, blood clot (thrombus formation), and puncture of an artery; nerve damage and irregular heartbeat and possibility to perform a PICC exchange if needed/ordered by physician.  Alternatives to this procedure were also discussed.  Bard Power PICC patient education guide, fact sheet on infection prevention and patient information card has been provided to patient /or left at bedside.    PICC Placement Documentation  PICC Triple Lumen 08/27/22 Left Brachial 39 cm 1 cm (Active)  Indication for Insertion or Continuance of Line Vasoactive infusions 08/27/22 1215  Exposed Catheter (cm) 1 cm 08/27/22 1215  Site Assessment Clean, Dry, Intact 08/27/22 1215  Lumen #1 Status Flushed;Saline locked;Blood return noted 08/27/22 1215  Lumen #2 Status Flushed;Saline locked;Blood return noted 08/27/22 1215  Lumen #3 Status Flushed;Saline locked;Blood return noted 08/27/22 1215  Dressing Type Transparent;Securing device 08/27/22 1215  Dressing Status Antimicrobial disc in place;Clean, Dry, Intact 08/27/22 1215  Safety Lock Not Applicable 40/98/11 9147  Line Care Connections checked and tightened 08/27/22 1215  Line Adjustment (NICU/IV Team Only) No 08/27/22 1215  Dressing Intervention New dressing;Other (Comment) 08/27/22 1215  Dressing Change Due 09/03/22 08/27/22 1215    Patient's son signed PICC consent.   Enos Fling 08/27/2022, 12:16 PM

## 2022-08-27 NOTE — Progress Notes (Signed)
Rounding Note    Patient Name: Kaylee Murphy Date of Encounter: 08/27/2022  [facility] Cardiologist: None   Subjective   Intubated, sedated.   Inpatient Medications    Scheduled Meds:  feeding supplement (PROSource TF20)  60 mL Per Tube Daily   feeding supplement (VITAL HIGH PROTEIN)  1,000 mL Per Tube Q24H   fentaNYL (SUBLIMAZE) injection  50 mcg Intravenous Once   insulin aspart  0-15 Units Subcutaneous Q4H   mouth rinse  15 mL Mouth Rinse Q2H   pantoprazole  40 mg Oral Daily   Continuous Infusions:  doxycycline (VIBRAMYCIN) IV Stopped (08/27/22 0116)   fentaNYL infusion INTRAVENOUS 150 mcg/hr (08/27/22 0727)   heparin 900 Units/hr (08/27/22 0300)   lidocaine 1 mg/min (08/27/22 0300)   propofol (DIPRIVAN) infusion     PRN Meds: docusate sodium, fentaNYL, midazolam, mouth rinse, polyethylene glycol   Vital Signs    Vitals:   08/27/22 0615 08/27/22 0630 08/27/22 0645 08/27/22 0735  BP: 105/65 108/70 109/66 125/76  Pulse: 66 65 65 64  Resp: (!) 22 (!) 22 (!) 22 (!) 22  Temp:      TempSrc:      SpO2: 100% 100% 100% 100%  Weight:      Height:        Intake/Output Summary (Last 24 hours) at 08/27/2022 0750 Last data filed at 08/27/2022 0600 Gross per 24 hour  Intake 1136.99 ml  Output 350 ml  Net 786.99 ml      08/27/2022    3:00 AM 08/18/2022    7:49 PM 08/27/2022    7:00 PM  Last 3 Weights  Weight (lbs) 141 lb 8.6 oz 165 lb 5.5 oz 165 lb 5.5 oz  Weight (kg) 64.2 kg 75 kg 75 kg      Telemetry    Sinus - Personally Reviewed  ECG    No am EKG - Personally Reviewed  Physical Exam   GEN: Intubated, sedated Neck: No JVD Cardiac: RRR, no murmurs, rubs, or gallops.  Respiratory: Clear to auscultation bilaterally. GI: Soft MS: No LE edema  Labs    High Sensitivity Troponin:   Recent Labs  Lab 08/24/2022 1920 09/05/2022 2120  TROPONINIHS 98* 141*     Chemistry Recent Labs  Lab 08/20/2022 1920 08/15/2022 2007 08/27/22 0508  08/27/22 0513  NA 141 141 140 138  K 3.2* 3.0* 3.6 3.7  CL 111  --   --  106  CO2 16*  --   --  20*  GLUCOSE 267*  --   --  137*  BUN 7  --   --  10  CREATININE 0.93  --   --  0.88  CALCIUM 7.4*  --   --  8.3*  MG 1.7  --   --  2.2  PROT 6.1*  --   --   --   ALBUMIN 2.7*  --   --   --   AST 58*  --   --   --   ALT 37  --   --   --   ALKPHOS 88  --   --   --   BILITOT 0.2*  --   --   --   GFRNONAA >60  --   --  >60  ANIONGAP 14  --   --  12    Lipids  Recent Labs  Lab 08/27/22 0513  TRIG 56    Hematology Recent Labs  Lab 09/10/2022 2242 08/27/22 0508 08/27/22 0513  WBC 27.4*  --  27.9*  RBC 4.39  --  4.29  HGB 11.6* 11.6* 11.5*  HCT 36.0 34.0* 35.3*  MCV 82.0  --  82.3  MCH 26.4  --  26.8  MCHC 32.2  --  32.6  RDW 14.4  --  14.6  PLT 381  --  367   Thyroid  Recent Labs  Lab 09/11/2022 1930  TSH 1.869    BNP Recent Labs  Lab 08/19/2022 2242  BNP 890.8*    DDimer No results for input(s): "DDIMER" in the last 168 hours.   Radiology    [place] EKG SITE RITE  Result Date: 08/27/2022 If Site Rite image not attached, placement could not be confirmed due to current cardiac rhythm.  DG Abd Portable 1V  Result Date: 08/28/2022 CLINICAL DATA:  Orogastric tube placement. EXAM: PORTABLE ABDOMEN - 1 VIEW COMPARISON:  None Available. FINDINGS: Tip and side port of the enteric tube below the diaphragm in the stomach. Moderate stool in the included colon. Nonobstructive upper abdominal bowel gas pattern. IMPRESSION: Tip and side port of the enteric tube below the diaphragm in the stomach. Electronically Signed   By: Keith Rake M.D.   On: 08/25/2022 21:01   CT HEAD WO CONTRAST (5MM)  Result Date: 08/30/2022 CLINICAL DATA:  Seizure EXAM: CT HEAD WITHOUT CONTRAST TECHNIQUE: Contiguous axial images were obtained from the base of the skull through the vertex without intravenous contrast. RADIATION DOSE REDUCTION: This exam was performed according to the departmental  dose-optimization program which includes automated exposure control, adjustment of the mA and/or kV according to patient size and/or use of iterative reconstruction technique. COMPARISON:  02/19/2022 FINDINGS: Brain: No acute infarct or hemorrhage. Lateral ventricles and midline structures are unremarkable. No acute extra-axial fluid collections. No mass effect. Vascular: No hyperdense vessel or unexpected calcification. Skull: Normal. Negative for fracture or focal lesion. Sinuses/Orbits: No acute finding. Other: None. IMPRESSION: 1. No acute intracranial process. Electronically Signed   By: Randa Ngo M.D.   On: 09/08/2022 20:27   DG Chest Portable 1 View  Result Date: 08/18/2022 CLINICAL DATA:  Intubated, status post CPR EXAM: PORTABLE CHEST 1 VIEW COMPARISON:  11/29/2017 FINDINGS: Single frontal view of the chest demonstrates endotracheal tube overlying tracheal air column, tip approximately 1.5 cm above carina. External defibrillator pads overlie the chest. The cardiac silhouette is enlarged. There is diffuse interstitial and ground-glass opacities, favor edema. No effusion or pneumothorax. No displaced fractures. IMPRESSION: 1. Diffuse interstitial and ground-glass opacities, favor edema. 2. No complication after intubation. Electronically Signed   By: Randa Ngo M.D.   On: 08/20/2022 19:11    Cardiac Studies     Patient Profile     61 y.o. 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   Assessment & Plan    Cardiac arrest: Reported seizure like activity then syncope. She was found to be in v fib by EMS with ROSC after 50 minutes of CPR. She was admitted by PCCM. On call IC team did not feel that cardiac cath was indicated last night based on her EKG findings after ROSC. Neurology consult suggests EEG findings c/w anoxic brain injury. Mild  troponin elevation post arrest.  -Will continue to follow for now. Concern for anoxic brain injury so no plans for invasive cardiac evaluation until she has meaningful neurological recovery.  -  Echo today to assess LVEF (of note, her LVEF was 45% by echo in June 2023 with inferior and inferolateral wall hypokinesis).   For questions or updates, please contact [place] Please consult [email or web address] for contact info under     Signed, Lauree Chandler, MD , [facility] 08/27/2022, 7:50 AM

## 2022-08-27 NOTE — Procedures (Signed)
Routine EEG Report  Kaylee Murphy is a 61 y.o. female with a history of cardiac arrest who is undergoing an EEG to evaluate for seizures.  Report: This EEG was acquired with electrodes placed according to the International 10-20 electrode system (including Fp1, Fp2, F3, F4, C3, C4, P3, P4, O1, O2, T3, T4, T5, T6, A1, A2, Fz, Cz, Pz). The following electrodes were missing or displaced: none.  The background consisted of a burst suppression pattern as follows: 1-2 polyspike and wave discharges alternating with 3-5 seconds of diffuse suppression. The discharges were timelocked to full body myoclonic jerks reviewed by video. This activity was not clearly reactive to stimulation. There were no other seizure types noted in addition to the myoclonic seizures described above. Photic stimulation and hyperventilation were not performed.  Impression and clinical correlation: This EEG was obtained while comatose and sedated on versed and fentanyl and is abnormal due to: - Burst suppression pattern - Myoclonic status epilepticus  Both of these findings are associated with severe global cerebral dysfunction. Recommend continuous EEG.   Su Monks, MD [facility] [phone]  If 7pm- 7am, please page neurology on call as listed in [place].

## 2022-08-27 NOTE — Progress Notes (Signed)
LTM EEG hooked up and running - no initial skin breakdown - push button tested - [facility] monitoring.  

## 2022-08-27 NOTE — Progress Notes (Signed)
[facility] for Heparin Indication: chest pain/ACS Brief A/P: Heparin level within goal range Continue Heparin at current rate  Allergies  Allergen Reactions   Cephalexin Swelling    REACTION: tongue swells and becomes disoriented   Aspirin Nausea And Vomiting   Codeine Itching   Ketorolac Tromethamine Nausea And Vomiting   Morphine And Related Other (See Comments)    headache    Patient Measurements: Height: '5\' 5"'$  (165.1 cm) Weight: 64.2 kg (141 lb 8.6 oz) IBW/kg (Calculated) : 57 Heparin Dosing Weight: 72 kg  Vital Signs: Temp: 98.2 F (36.8 C) (12/15 0408) Temp Source: Axillary (12/15 0408) BP: 128/72 (12/15 0145) Pulse Rate: 74 (12/15 0325)  Labs: Recent Labs    08/30/2022 1920 08/16/2022 2007 08/23/2022 2120 08/25/2022 2242 08/27/22 0508 08/27/22 0513  HGB  --    < >  --  11.6* 11.6* 11.5*  HCT  --    < >  --  36.0 34.0* 35.3*  PLT  --   --   --  381  --  367  LABPROT  --   --  16.1*  --   --   --   INR  --   --  1.3*  --   --   --   HEPARINUNFRC  --   --   --   --   --  0.37  CREATININE 0.93  --   --   --   --   --   TROPONINIHS 98*  --  141*  --   --   --    < > = values in this interval not displayed.     Estimated Creatinine Clearance: 57.9 mL/min (by C-G formula based on SCr of 0.93 mg/dL).  Assessment: 61 y.o. female s/p cardiac arrest for heparin for ACS  Goal of Therapy:  Heparin level 0.3-0.7 units/ml Monitor platelets by anticoagulation protocol: Yes   Plan:  Continue Heparin at current rate   Phillis Knack, PharmD, BCPS  08/27/2022,6:48 AM

## 2022-08-27 NOTE — Progress Notes (Signed)
  Echocardiogram 2D Echocardiogram has been performed.  Kaylee Murphy 08/27/2022, 10:10 AM

## 2022-08-27 NOTE — Progress Notes (Signed)
Patient transported to and from MRI using ventilator on 100% Fi02 without incident. Returned to previous settings upon return to [facility].

## 2022-08-27 NOTE — Progress Notes (Signed)
Initial Nutrition Assessment  DOCUMENTATION CODES:   Not applicable  INTERVENTION:   Tube Feeding via OG:  Vital AF 1.2 at 60 ml/hr This provides 1728 kcals, 108 g of protein and 1166 mL of free water  Additional kcals (lipid) via Propofol infusion  NUTRITION DIAGNOSIS:   Inadequate oral intake related to acute illness as evidenced by NPO status.  GOAL:   Patient will meet greater than or equal to 90% of their needs  MONITOR:   TF tolerance, Labs, Vent status, Weight trends, Skin  REASON FOR ASSESSMENT:   Consult, Ventilator Enteral/tube feeding initiation and management  ASSESSMENT:   61 yo female admitted with witnessed OOH Vfib arrest, prolonged Qtc, possible torsades. Pt required intubation, possible sizures (no hx). PMH includes HTN, CVA, DM, HLD, tobacco use  Pt remains on vent support, not currently on TTM but pt not febrile. Currently on lidocaine infusion, started on propofol for burst suppression, increased to 120 mcg/kg/min   Neurology follow. Initial EEG consistent with burst suppression pattern and myoclonic status epilepticus, both of these associated with severe global cerebral dysfunction. Plan for continuous EEG.   Vital High Protein started at 40 ml/hr this AM via OG tube per Adult TF protocol (ordered by MD) OG tube in stomach per abd xray; moderate stool burden in colon, nonobstructive bowel gas pattern No BM yet, currently has prn bowel regimen orders. Recommend changing to scheduled; consider enema if continues without BM  Labs: reviewed Meds: reviewed   NUTRITION - FOCUSED PHYSICAL EXAM:  Unable to assess, pt in procedure  Diet Order:   Diet Order     None       EDUCATION NEEDS:   Education needs have been addressed  Skin:  Skin Assessment: Reviewed RN Assessment  Last BM:  PTA  Height:   Ht Readings from Last 1 Encounters:  09/05/2022 '5\' 5"'$  (1.651 m)    Weight:   Wt Readings from Last 1 Encounters:  08/27/22 64.2 kg     BMI:  Body mass index is 23.55 kg/m.  Estimated Nutritional Needs:   Kcal:  1700-1900 kcals  Protein:  90-115 g  Fluid:  >/= 1.7 L    Kerman Passey MS, RDN, LDN, CNSC Registered Dietitian 3 Clinical Nutrition RD Pager and On-Call Pager Number Located in [place]

## 2022-08-27 NOTE — Progress Notes (Addendum)
NAME:  Kaylee Murphy, MRN:  [id number], DOB:  1961/06/02, LOS: 1 ADMISSION DATE:  08/17/2022, CONSULTATION DATE:  08/27/22 REFERRING MD:  Tegeler, CHIEF COMPLAINT:  cardiac arrest   History of Present Illness:  Kaylee Murphy is a 61 y.o. 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  History taken from Epic and report as no family is available; apparently she had seizure-like activity and EMS was called.  Pt was awake, but altered on arrival and then had a syncopal episode with loss of pulses.  Initial rhythm was Vfib. She underwent approximately 50 minutes of CPR with a run of Torsades and was intubated by EMS.   In the ED, ETT changed without sedation, then patient began moving and had purposeful reaching for tube.   At the time of PCCM consult, no labs or head CT available. She initially required an epinephrine gtt, this was weaned off.  EKG without STEMI and Cardiology was also consulted.  Pertinent  Medical History   has a past medical history of Anemia, Anxiety, Chronic back pain, Chronic shoulder pain (10 years), Depression, Diabetes mellitus, Hyperlipidemia, and Hypertension.   Significant Hospital Events: Including procedures, antibiotic start and stop dates in addition to other pertinent events   12/14 witnessed out of hospital vfib arrest, 50 mins CPR  Interim History / Subjective:  No acute events overnight. EEG ongoing. Deeply sedated for status myoclonus.   Objective   Blood pressure 109/66, pulse 65, temperature 98.2 F (36.8 C), temperature source Axillary, resp. rate (!) 22, height _0  (1.651 m), weight 64.2 kg, last menstrual period 02/13/2014, SpO2 100 %.    Vent Mode: PRVC FiO2 (%):  [40 %-100 %] 40 % Set Rate:  [18 bmp-22 bmp] 22 bmp Vt Set:  [440 mL] 440 mL PEEP:  [5 cmH20] 5 cmH20 Plateau  Pressure:  [15 cmH20-18 cmH20] 15 cmH20   Intake/Output Summary (Last 24 hours) at 08/27/2022 0735 Last data filed at 08/27/2022 0600 Gross per 24 hour  Intake 1136.99 ml  Output 350 ml  Net 786.99 ml   Filed Weights   09/04/2022 1900 09/08/2022 1949 08/27/22 0300  Weight: 75 kg 75 kg 64.2 kg    General:  Middle aged female in NAD on vent HEENT: [place]/AT, ETT, Pupils pinpoint.  Neuro: RASS - 4 CV: RRR, no MRG PULM:  Clear bilateral breath sounds GI: Soft, ND, hypoactive Extremities: No acute deformity or ROM limitation.  Skin: Grossly intact   Resolved Hospital Problem list     Assessment & Plan:   Witnessed, out of hospital Vfib cardiac arrest, possible Torsades Prolonged Qtc 50 vs 15?? minutes CPR, but initiated by EMS and pt with reported purposeful movement in the ED. CT head unremarkable.  -Echo -Cardiology consult: CAD workup pending course.  - Lidocaine infusion (brady on amio) - Keep mag > 2 and K > 4.  -Supportive care for ~ 72 hours before weaning sedation for prognostication purposes.  -Watch temps closely, may need to consider TTM. Reporeted purposeful  movement in ED, but SE vs myoclonic status worrisome.   Acute Hypoxic Respiratory Failure -Maintain full vent support -titrate Vent setting to maintain SpO2 greater than or equal to 90%. -Bronchial hygiene and RT/bronchodilator protocol. - Not a candidate for SAT/SBT at this time - Propofol fentanyl for RASS goal -  1 to -2 and goal of burst suppression.   ? CAP: bilateral infiltrates and leukocytosis - doxy - Cultures - Strep and legionella urinary antigens - RVP negative  Seizure reported prior to arrest. No history SE vs Myoclonic status: s/p arrest - EEG ongoing - AEDs per neurology  Type 2 DM -hold home Metformin -A1c and SSI  History of CVA -hold plavix and asa pending CT head then resume  HTN -hold home atenolol  ? Chronic Pain -hold home methadone for now  Nutrition - Start TF  Place  PICC for limited IV access.    Best Practice (right click and "Reselect all SmartList Selections" daily)   Diet/type: NPO DVT prophylaxis: SCD GI prophylaxis: PPI Lines: N/A Foley:  Yes, and it is still needed Code Status:  full code Last date of multidisciplinary goals of care discussion [pending, no family available]  Labs   CBC: Recent Labs  Lab 08/13/2022 2007 08/24/2022 2242 08/27/22 0508 08/27/22 0513  WBC  --  27.4*  --  27.9*  NEUTROABS  --  21.1*  --   --   HGB 12.2 11.6* 11.6* 11.5*  HCT 36.0 36.0 34.0* 35.3*  MCV  --  82.0  --  82.3  PLT  --  381  --  390    Basic Metabolic Panel: Recent Labs  Lab 08/24/2022 1920 09/09/2022 2007 08/27/22 0508 08/27/22 0513  NA 141 141 140 138  K 3.2* 3.0* 3.6 3.7  CL 111  --   --  106  CO2 16*  --   --  20*  GLUCOSE 267*  --   --  137*  BUN 7  --   --  10  CREATININE 0.93  --   --  0.88  CALCIUM 7.4*  --   --  8.3*  MG 1.7  --   --  2.2  PHOS  --   --   --  2.6   GFR: Estimated Creatinine Clearance: 61.2 mL/min (by C-G formula based on SCr of 0.88 mg/dL). Recent Labs  Lab 09/02/2022 2210 08/21/2022 2242 08/27/22 0513  WBC  --  27.4* 27.9*  LATICACIDVEN 3.1*  --   --     Liver Function Tests: Recent Labs  Lab 08/19/2022 1920  AST 58*  ALT 37  ALKPHOS 88  BILITOT 0.2*  PROT 6.1*  ALBUMIN 2.7*   No results for input(s): "LIPASE", "AMYLASE" in the last 168 hours. No results for input(s): "AMMONIA" in the last 168 hours.  ABG    Component Value Date/Time   PHART 7.322 (L) 08/27/2022 0508   PCO2ART 41.0 08/27/2022 0508   PO2ART 142 (H) 08/27/2022 0508   HCO3 21.3 08/27/2022 0508   TCO2 23 08/27/2022 0508   ACIDBASEDEF 5.0 (H) 08/27/2022 0508   O2SAT 99 08/27/2022 0508     Coagulation Profile: Recent Labs  Lab 08/30/2022 2120  INR 1.3*    Cardiac Enzymes: No results for input(s): "CKTOTAL", "CKMB", "CKMBINDEX", "TROPONINI" in the last 168 hours.  HbA1C: Hemoglobin A1C  Date/Time Value Ref Range  Status  01/30/2013 09:58 AM 6.1  Final  10/20/2011 08:59 AM 6.9  Final   Hgb A1c MFr Bld  Date/Time Value Ref Range Status  02/19/2022 03:30 PM 6.8 (H) 4.8 - 5.6 % Final    Comment:    (NOTE) Pre diabetes:          5.7%-6.4%  Diabetes:              >6.4%  Glycemic control for   <7.0% adults with diabetes   08/24/2010 02:32 PM 7.0 %     CBG: Recent Labs  Lab 08/22/2022 2119 08/29/2022 2234 08/27/22 0408  GLUCAP 205* 212* 185*    Review of Systems:   Unable to obtain  Past Medical History:  She,  has a past medical history of Anemia, Anxiety, Chronic back pain, Chronic shoulder pain (10 years), Depression, Diabetes mellitus, Hyperlipidemia, and Hypertension.   Surgical History:   Past Surgical History:  Procedure Laterality Date   BREAST REDUCTION SURGERY  1996   ROTATOR CUFF REPAIR Right 2005   ROTATOR CUFF REPAIR Right 2004   ROTATOR CUFF REPAIR Left 2007   TUBAL LIGATION  1991     Social History:   reports that she has been smoking cigarettes. She has been smoking an average of .25 packs per day. She has never used smokeless tobacco. She reports that she does not drink alcohol and does not use drugs.   Family History:  Her family history includes Cancer in her mother; Stroke in her father. There is no history of Colon cancer, Esophageal cancer, Rectal cancer, or Stomach cancer.   Allergies Allergies  Allergen Reactions   Cephalexin Swelling    REACTION: tongue swells and becomes disoriented   Aspirin Nausea And Vomiting   Codeine Itching   Ketorolac Tromethamine Nausea And Vomiting   Morphine And Related Other (See Comments)    headache     Home Medications  Prior to Admission medications   Medication Sig Start Date End Date Taking? Authorizing Provider  Accu-Chek FastClix Lancets MISC use to check blood sugar 4 times daily. 02/22/22   Ghimire, Henreitta Leber, MD  aspirin EC 81 MG tablet Take 1 tablet (81 mg total) by mouth daily. Swallow whole. 02/22/22    Ghimire, Henreitta Leber, MD  atenolol (TENORMIN) 50 MG tablet Take 50 mg by mouth every morning. 01/11/22   [provider]  Blood Glucose Monitoring Suppl (ACCU-CHEK GUIDE) w/Device KIT Use to check blood sugars up to 4 times daily 02/22/22   Ghimire, Henreitta Leber, MD  clopidogrel (PLAVIX) 75 MG tablet Take 1 tablet (75 mg total) by mouth daily. Take along with aspirin for 21 days-after 21 days stop Plavix and continue on aspirin alone. 02/22/22   Ghimire, Henreitta Leber, MD  gabapentin (NEURONTIN) 400 MG capsule Take 400 mg by mouth 2 (two) times daily. 01/11/22   [provider]  glucose blood test strip use to check blood sugar 4 times daily 02/22/22   Ghimire, Henreitta Leber, MD  ibuprofen (ADVIL) 200 MG tablet Take 400 mg by mouth 2 (two) times daily as needed (pain).    [provider]  metFORMIN (GLUCOPHAGE) 500 MG tablet Take 1 tablet (500 mg total) by mouth 2 (two) times daily with a meal. 02/22/22 02/22/23  Ghimire, Henreitta Leber, MD  METHADONE HCL PO Take 80 mg by mouth every morning.    [provider]  rosuvastatin (CRESTOR) 20 MG tablet Take 1 tablet (20 mg total) by mouth daily. 02/22/22   Ghimire, Henreitta Leber, MD     Critical care time:  76 minutes      Georgann Housekeeper, AGACNP-BC [facility]  See Amion for personal pager PCCM on call pager [phone] until 7pm. Please call [place] 7p-7a. [phone]  08/27/2022 7:48 AM

## 2022-08-27 NOTE — Progress Notes (Signed)
Patient ID: Kaylee Murphy, female   DOB: 1961/08/06, 61 y.o.   MRN: [id number]   61 year old female who presented to the emergency room postcardiac arrest. Had seizure-like activity and then lost pulses. Had a V-fib arrest requiring CPR. EEG shows myoclonic status and burst suppression pattern most likely secondary to diffuse anoxic brain injury.   Sedation appears to be causing softer BP not present prior, adding Levophed   Plans:  - on Keppra to 1000 mg BID -on Depakote 500 twice daily - Continuing propofol at 100 mcg/kg/min - Versed infusion  - adding Levophed to combat likely sedation induced hypotension via PICC

## 2022-08-27 NOTE — Progress Notes (Signed)
[facility] for Heparin Indication: chest pain/ACS  Allergies  Allergen Reactions   Cephalexin Swelling    REACTION: tongue swells and becomes disoriented   Aspirin Nausea And Vomiting   Codeine Itching   Ketorolac Tromethamine Nausea And Vomiting   Morphine And Related Other (See Comments)    headache    Patient Measurements: Height: '5\' 5"'$  (165.1 cm) Weight: 64.2 kg (141 lb 8.6 oz) IBW/kg (Calculated) : 57 Heparin Dosing Weight: 72 kg  Vital Signs: Temp: 98.1 F (36.7 C) (12/15 0811) Temp Source: Oral (12/15 0811) BP: 105/67 (12/15 1400) Pulse Rate: 57 (12/15 1200)  Labs: Recent Labs    08/19/2022 1920 09/10/2022 2007 08/22/2022 2120 08/25/2022 2242 08/27/22 0508 08/27/22 0513 08/27/22 1342  HGB  --    < >  --  11.6* 11.6* 11.5*  --   HCT  --    < >  --  36.0 34.0* 35.3*  --   PLT  --   --   --  381  --  367  --   LABPROT  --   --  16.1*  --   --   --   --   INR  --   --  1.3*  --   --   --   --   HEPARINUNFRC  --   --   --   --   --  0.37 0.37  CREATININE 0.93  --   --   --   --  0.88  --   TROPONINIHS 98*  --  141*  --   --   --   --    < > = values in this interval not displayed.     Estimated Creatinine Clearance: 61.2 mL/min (by C-G formula based on SCr of 0.88 mg/dL).  Assessment: 61 y.o. female s/p cardiac arrest with VDRF and myoclonic status epilepticus . Pharmacy dosing heparin. -heparin level at goal on 900 units/hr   Goal of Therapy:  Heparin level 0.3-0.7 units/ml Monitor platelets by anticoagulation protocol: Yes   Plan:  -Continue heparin at 900 units/hr -Daily heparin level and CBC  Hildred Laser, PharmD Clinical Pharmacist **Pharmacist phone directory can now be found on [email or web address] ([email or web address]).  Listed under [place].

## 2022-08-27 NOTE — Progress Notes (Signed)
Called to clarify Neo-synephrine orders. Dr. Jenetta Downer no longer in the box. Will ask day shift MD.

## 2022-08-27 NOTE — Progress Notes (Addendum)
Message to [place]:  Per EMS notes Kaylee Murphy cardiac arrested after a seizure. I was looking through her medication administration and it does not show that we gave her any Keppra or anything similar. Are we interested in starting her on seizure meds? Also, I have a RASS goal order for -1 and in the past I feel like when they're having seizures/ seizure like activity we typically want them pretty sedate. Could you ask if we want to re-evaluate the RASS goal? Awaiting response and orders for further care.   [facility] response: We are aware of the seizure prior to the arrest.  He has been tied up w/emergency after emergency & he is backed up terribly, so he has not had a chance to get to her admit note/assessment yet.  I will notify him of your concerns, but he may not get to it before shift change.  As for her jerking, she has the prn versed & fentanyl, & it looks like you still have a good amount of room to titrate up on the propofol, so please just give that for now.  As for the sedation r/t seizures, it's usually to suppression & that just varies for each pt, so some have more sedation requirements than others.   53 mins SR

## 2022-08-27 NOTE — Progress Notes (Signed)
Neurology to bedside curious as to how long patient has been jerking in this way. I informed him that she has been jerking since arrival and I informed E-link RN at [phone] and [phone] and have not received any further orders.   Propofol ordered to infuse at 53mg/kg/min. Keppra ordered. Depakote ordered by neurology team.

## 2022-08-27 NOTE — Progress Notes (Signed)
EEG complete - results pending 

## 2022-08-27 NOTE — Consult Note (Signed)
Neurology Consultation  Reason for Consult: Postcardiac arrest seizure-like activity, concern for seizure prior to or around the time of arrest Referring Physician: Dr. Erin Fulling  CC: Seizure-like activity  History is obtained from: Chart review  HPI: Kaylee Murphy is a 61 y.o. female past medical history of diabetes hypertension hyperlipidemia chronic pain, tobacco abuse, stroke small vessel etiology presented to the emergency room with mild left hemiparesis at the time of discharge in June, unclear of any residual deficits, brought in after cardiac arrest. She presumably had seizure-like activity and EMS was called.  Patient was awake alert on arrival but then had a syncopal episode with loss of pulses.  Initial rhythm was V-fib.  She underwent approximately 50 minutes of CPR with a run of torsades and was intubated by EMS.  In the ED DT changed without sedation and then patient began moving and had purposeful reaching for the tube.  She was transferred to the ICU initially on an epinephrine drip but then that was weaned off.  EKG without STEMI.  Cardiology consulted.  No acute intervention at this time from cardiology. H&P from ICU mentions 50-minute downtime whereas ER notes mention 15 minutes-unable to clarify at this time. Nonetheless, a routine EEG was ordered, when my technologist performed the EEG, she was concern for abnormalities and call me.  On my preliminary review, EEG consistent with myoclonic discharges that are generalized and very rhythmic.  I contacted the critical care team and requested a neurological consultation. No family at bedside at this time. According to the RN, she has been having myoclonic jerking ever since she came to the unit.  Unclear if she was having this prior to her transport to the unit.  ROS: Unable to obtain due to altered mental status.   Past Medical History:  Diagnosis Date   Anemia    Anxiety    Chronic back pain    Chronic shoulder pain 10 years    s/p multiple b/l rotator cuff repair sx - per MRI 04/2010 -  Delamination of the infraspinatus with mild fatty atrophysuggest distal tendon tear with medial propagation. Depending onthe hardware, CT arthrography may add information for evaluation othe rotator cuff.f    Depression    Diabetes mellitus    Hyperlipidemia    Hypertension      Family History  Problem Relation Age of Onset   Cancer Mother    Stroke Father    Colon cancer Neg Hx    Esophageal cancer Neg Hx    Rectal cancer Neg Hx    Stomach cancer Neg Hx      Social History:   reports that she has been smoking cigarettes. She has been smoking an average of .25 packs per day. She has never used smokeless tobacco. She reports that she does not drink alcohol and does not use drugs.  Medications  Current Facility-Administered Medications:    docusate sodium (COLACE) capsule 100 mg, 100 mg, Oral, BID PRN, Gleason, Otilio Carpen, PA-C   doxycycline (VIBRAMYCIN) 100 mg in sodium chloride 0.9 % 250 mL IVPB, 100 mg, Intravenous, Q12H, Freddi Starr, MD, Stopped at 08/27/22 0116   fentaNYL (SUBLIMAZE) bolus via infusion 50-100 mcg, 50-100 mcg, Intravenous, Q15 min PRN, Gleason, Otilio Carpen, PA-C   fentaNYL (SUBLIMAZE) injection 50 mcg, 50 mcg, Intravenous, Once, Gleason, Otilio Carpen, PA-C   fentaNYL 2547mg in NS 2538m(1060mml) infusion-PREMIX, 50-200 mcg/hr, Intravenous, Continuous, Gleason, LauOtilio CarpenA-C, Last Rate: 15 mL/hr at 08/27/22 0300, 150 mcg/hr at 08/27/22 0300  heparin ADULT infusion 100 units/mL (25000 units/276m), 900 Units/hr, Intravenous, Continuous, AFranky Macho RPH, Last Rate: 9 mL/hr at 08/27/22 0300, 900 Units/hr at 08/27/22 0300   insulin aspart (novoLOG) injection 0-15 Units, 0-15 Units, Subcutaneous, Q4H, Gleason, LOtilio Carpen PA-C, 5 Units at 08/17/2022 2235   levETIRAcetam (KEPPRA) 2,000 mg in sodium chloride 0.9 % 250 mL IVPB, 2,000 mg, Intravenous, STAT, AAmie Portland MD   [COMPLETED] lidocaine (XYLOCAINE) 4  mg/mL bolus via infusion 100 mg, 100 mg, Intravenous, Once, 100 mg at 08/25/2022 2304 **AND** lidocaine (cardiac) 2000 mg in dextrose 5% 500 mL ('4mg'$ /mL) IV infusion, 1 mg/min, Intravenous, Continuous, RMarykay Lex MD, Last Rate: 15 mL/hr at 08/27/22 0300, 1 mg/min at 08/27/22 0300   midazolam (VERSED) injection 1-2 mg, 1-2 mg, Intravenous, Q1H PRN, DFreddi Starr MD, 2 mg at 08/27/22 00737  Oral care mouth rinse, 15 mL, Mouth Rinse, Q2H, DFreddi Starr MD   Oral care mouth rinse, 15 mL, Mouth Rinse, PRN, DFreddi Starr MD   pantoprazole (PROTONIX) EC tablet 40 mg, 40 mg, Oral, Daily, Gleason, LOtilio Carpen PA-C   polyethylene glycol (MIRALAX / GLYCOLAX) packet 17 g, 17 g, Oral, Daily PRN, Gleason, LOtilio Carpen PA-C   propofol (DIPRIVAN) 1000 MG/100ML infusion, 0-50 mcg/kg/min, Intravenous, Continuous, CDani Gobble MD, Last Rate: 4.5 mL/hr at 08/27/22 0035, 10 mcg/kg/min at 08/27/22 0035   valproate (DEPACON) 1,250 mg in dextrose 5 % 50 mL IVPB, 1,250 mg, Intravenous, Once, AAmie Portland MD  Exam: Current vital signs: BP 128/72   Pulse 74   Temp 98.2 F (36.8 C) (Axillary)   Resp 19   Ht '5\' 5"'$  (1.651 m)   Wt 64.2 kg   LMP 02/13/2014   SpO2 100%   BMI 23.55 kg/m  Vital signs in last 24 hours: Temp:  [96.9 F (36.1 C)-98.8 F (37.1 C)] 98.2 F (36.8 C) (12/15 0408) Pulse Rate:  [29-118] 74 (12/15 0325) Resp:  [18-35] 19 (12/15 0325) BP: (100-199)/(65-122) 128/72 (12/15 0145) SpO2:  [97 %-100 %] 100 % (12/15 0422) FiO2 (%):  [40 %-100 %] 40 % (12/15 0422) Weight:  [64.2 kg-75 kg] 64.2 kg (12/15 0300) General: Sedated intubated-on propofol 40 mcg/kg/min. HEENT: Normocephalic atraumatic Lungs: Clear Cardiovascular: Regular rhythm Abdomen nondistended nontender Extremities well-perfused Neurologic exam Sedated intubated as above Sedation was not held for the exam Does not follow any commands Does not open her eyes to voice or noxious simulation Is having full body  myoclonic jerking every 1 to 2 seconds which is exacerbated by stimulus. Cranial nerves: Pupils equal round reactive to light sluggishly, corneal reflexes present, gaze is midline, facial symmetry difficult to ascertain due to the tube. Motor examination: No spontaneous movement of the the myoclonus described above.  No movement to noxious simulation. Sensation: As above  Labs I have reviewed labs in epic and the results pertinent to this consultation are: CBC    Component Value Date/Time   WBC 27.4 (H) 08/29/2022 2242   RBC 4.39 08/14/2022 2242   HGB 11.6 (L) 08/27/2022 0508   HCT 34.0 (L) 08/27/2022 0508   PLT 381 08/27/2022 2242   MCV 82.0 09/08/2022 2242   MCH 26.4 09/01/2022 2242   MCHC 32.2 08/24/2022 2242   RDW 14.4 09/04/2022 2242   LYMPHSABS 3.7 08/30/2022 2242   MONOABS 2.0 (H) 08/14/2022 2242   EOSABS 0.1 09/03/2022 2242   BASOSABS 0.1 08/14/2022 2242    CMP     Component Value Date/Time   NA 140  08/27/2022 0508   K 3.6 08/27/2022 0508   CL 111 08/28/2022 1920   CO2 16 (L) 08/21/2022 1920   GLUCOSE 267 (H) 08/31/2022 1920   BUN 7 09/04/2022 1920   CREATININE 0.93 08/25/2022 1920   CREATININE 0.79 10/20/2011 0938   CALCIUM 7.4 (L) 08/20/2022 1920   PROT 6.1 (L) 08/18/2022 1920   ALBUMIN 2.7 (L) 08/15/2022 1920   AST 58 (H) 08/28/2022 1920   ALT 37 09/04/2022 1920   ALKPHOS 88 08/28/2022 1920   BILITOT 0.2 (L) 08/20/2022 1920   GFRNONAA >60 08/30/2022 1920   GFRNONAA 88 10/20/2011 0938   GFRAA >60 11/29/2017 1138   GFRAA >89 10/20/2011 0938   Troponin 98 followed by 141. BNP 890.8 Leukocytosis-WBC count 27.4.  Imaging I have reviewed the images obtained:  CT-head-unremarkable presentation  Assessment: 61 year old woman with above past medical history who presented to the emergency room postcardiac arrest.  Presumably she had some seizure-like activity and then lost pulses.  Had a V-fib arrest requiring CPR.  Has had whole body twitching movements  since arrival. EEG was ordered to evaluate this.  My technologist notified me of abnormal looking EEG which I personally reviewed.  EEG looks consistent with burst suppression with myoclonic jerks. This looks concerning for underlying anoxic brain injury. I am unclear of the downtime at this time. Usually myoclonic jerks very soon after cardiac arrest portend poor prognosis symptoms of neurologically meaningful recovery. I think that her jerking movements are not seizures because her prior strokes were subcortical and the jerky movement might have been more from a cardiac cause than true seizures.  Current EEG is more concerning for anoxic injury than seizures  Impression: Likely severe anoxic brain injury status postcardiac arrest  Recommendations: Loaded with Keppra 2 g IV x 1 Loaded with Depakote 1250 milligrams IV x 1 Continue Keppra 500 twice daily Continue Depakote 500 twice daily Increase propofol to 80 mcg/g/min Maintain seizure precautions Be mindful that increasing propofol and antiepileptic doses might make her hypotensive.  Discussed with Dr. Uvaldo Rising on the camera-he will order pressors to be available at bedside. Again as I said above, her clinical exam is very poor and presence of myoclonus so soon after cardiac arrest usually portends poor prognosis from neurological meaningful recovery but full prognostication may not be possible for 48 to 72 hours postcardiac arrest.  -- Amie Portland, MD Neurologist [facility] Pager: [phone]  CRITICAL CARE ATTESTATION Performed by: Amie Portland, MD Total critical care time: 42 minutes Critical care time was exclusive of separately billable procedures and treating other patients and/or supervising APPs/Residents/Students Critical care was necessary to treat or prevent imminent or life-threatening deterioration. This patient is critically ill and at significant risk for neurological worsening and/or death and care requires  constant monitoring. Critical care was time spent personally by me on the following activities: development of treatment plan with patient and/or surrogate as well as nursing, discussions with consultants, evaluation of patient's response to treatment, examination of patient, obtaining history from patient or surrogate, ordering and performing treatments and interventions, ordering and review of laboratory studies, ordering and review of radiographic studies, pulse oximetry, re-evaluation of patient's condition, participation in multidisciplinary rounds and medical decision making of high complexity in the care of this patient.

## 2022-08-27 NOTE — Progress Notes (Signed)
RT NOTE:  Pt transported to [place] without event. Report given to [place], RT.

## 2022-08-27 NOTE — Progress Notes (Signed)
EEG called. Pt. Is next to be seen.

## 2022-08-27 NOTE — Plan of Care (Addendum)
EEG: This EEG was obtained while comatose and sedated on versed and fentanyl and is abnormal due to: - Burst suppression pattern. - Myoclonic status epilepticus. Both of these findings are associated with severe global cerebral dysfunction. Recommend continuous EEG.  A/R: 61 year old female who presented to the emergency room postcardiac arrest. Presumably she had some seizure-like activity and then lost pulses. Had a V-fib arrest requiring CPR. Has had whole body twitching movements since arrival. EEG shows myoclonic status and burst suppression pattern most likely secondary to diffuse anoxic brain injury.  - Obtaining STAT LTM EEG - Increase Keppra to 1000 mg BID after supplemental loading dose of 2000 mg (ordered) - Continue Depakote 500 twice daily - Continuing propofol at 100 mcg/kg/min  Addendum: - EEG LTM preliminary review after hook up: Still in a similar pattern although the bursts are of lower amplitude. No background between bursts.   - Increasing propofol to 120 mcg/kg/min  Addendum: - As of 5:10 PM, the patient is still in myoclonic status per Dr. Artemio Aly LTM read - Should get MRI for prognostication. Will need to remove leads.  - Adding Versed with 10 mg bolus and then 10 mg/hr gtt  Addendum: - MRI brain completed at 2342.  - Official Radiology report states that there is no acute intracranial abnormality, with minimal nonspecific multifocal chronic small vessel ischemic changes on the T2-weighted images. - On personal review of the images, there appears in fact to be widespread subtle but abnormal restricted diffusion and FLAIR signal involving most if not all of the cerebral cortex, in addition to symmetric diffuse elevated FLAIR and DWI signal involving the basal ganglia, most consistent with severe diffuse anoxic brain injury.  - LTM hooked back up after MRI.   Electronically signed: Dr. Kerney Elbe

## 2022-08-28 ENCOUNTER — Encounter (HOSPITAL_COMMUNITY): Payer: Self-pay | Admitting: Pulmonary Disease

## 2022-08-28 ENCOUNTER — Inpatient Hospital Stay (HOSPITAL_COMMUNITY): Payer: Medicare Other

## 2022-08-28 DIAGNOSIS — I429 Cardiomyopathy, unspecified: Secondary | ICD-10-CM

## 2022-08-28 DIAGNOSIS — I469 Cardiac arrest, cause unspecified: Secondary | ICD-10-CM | POA: Diagnosis not present

## 2022-08-28 DIAGNOSIS — I4901 Ventricular fibrillation: Secondary | ICD-10-CM | POA: Diagnosis not present

## 2022-08-28 DIAGNOSIS — R4182 Altered mental status, unspecified: Secondary | ICD-10-CM

## 2022-08-28 DIAGNOSIS — G931 Anoxic brain damage, not elsewhere classified: Secondary | ICD-10-CM | POA: Diagnosis not present

## 2022-08-28 DIAGNOSIS — R569 Unspecified convulsions: Secondary | ICD-10-CM | POA: Diagnosis not present

## 2022-08-28 LAB — CBC
HCT: 28.4 % — ABNORMAL LOW (ref 36.0–46.0)
Hemoglobin: 9.2 g/dL — ABNORMAL LOW (ref 12.0–15.0)
MCH: 27.5 pg (ref 26.0–34.0)
MCHC: 32.4 g/dL (ref 30.0–36.0)
MCV: 84.8 fL (ref 80.0–100.0)
Platelets: 264 10*3/uL (ref 150–400)
RBC: 3.35 MIL/uL — ABNORMAL LOW (ref 3.87–5.11)
RDW: 14.7 % (ref 11.5–15.5)
WBC: 13.7 10*3/uL — ABNORMAL HIGH (ref 4.0–10.5)
nRBC: 0.1 % (ref 0.0–0.2)

## 2022-08-28 LAB — BASIC METABOLIC PANEL
Anion gap: 11 (ref 5–15)
Anion gap: 7 (ref 5–15)
BUN: 13 mg/dL (ref 6–20)
BUN: 14 mg/dL (ref 6–20)
CO2: 20 mmol/L — ABNORMAL LOW (ref 22–32)
CO2: 22 mmol/L (ref 22–32)
Calcium: 7.8 mg/dL — ABNORMAL LOW (ref 8.9–10.3)
Calcium: 7.9 mg/dL — ABNORMAL LOW (ref 8.9–10.3)
Chloride: 106 mmol/L (ref 98–111)
Chloride: 112 mmol/L — ABNORMAL HIGH (ref 98–111)
Creatinine, Ser: 0.73 mg/dL (ref 0.44–1.00)
Creatinine, Ser: 0.76 mg/dL (ref 0.44–1.00)
GFR, Estimated: >60 mL/min (ref >60)
GFR, Estimated: >60 mL/min (ref >60)
Glucose, Bld: 133 mg/dL — ABNORMAL HIGH (ref 70–99)
Glucose, Bld: 133 mg/dL — ABNORMAL HIGH (ref 70–99)
Potassium: 3.2 mmol/L — ABNORMAL LOW (ref 3.5–5.1)
Potassium: 4.1 mmol/L (ref 3.5–5.1)
Sodium: 137 mmol/L (ref 135–145)
Sodium: 141 mmol/L (ref 135–145)

## 2022-08-28 LAB — MAGNESIUM
Magnesium: 1.9 mg/dL (ref 1.7–2.4)
Magnesium: 2.1 mg/dL (ref 1.7–2.4)

## 2022-08-28 LAB — GLUCOSE, CAPILLARY
Glucose-Capillary: 102 mg/dL — ABNORMAL HIGH (ref 70–99)
Glucose-Capillary: 137 mg/dL — ABNORMAL HIGH (ref 70–99)
Glucose-Capillary: 146 mg/dL — ABNORMAL HIGH (ref 70–99)
Glucose-Capillary: 151 mg/dL — ABNORMAL HIGH (ref 70–99)
Glucose-Capillary: 161 mg/dL — ABNORMAL HIGH (ref 70–99)
Glucose-Capillary: 185 mg/dL — ABNORMAL HIGH (ref 70–99)

## 2022-08-28 LAB — HEPARIN LEVEL (UNFRACTIONATED): Heparin Unfractionated: 0.37 IU/mL (ref 0.30–0.70)

## 2022-08-28 LAB — PHOSPHORUS
Phosphorus: 2.1 mg/dL — ABNORMAL LOW (ref 2.5–4.6)
Phosphorus: 3 mg/dL (ref 2.5–4.6)

## 2022-08-28 LAB — HEMOGLOBIN A1C
Hgb A1c MFr Bld: 7.5 % — ABNORMAL HIGH (ref 4.8–5.6)
Mean Plasma Glucose: 169 mg/dL

## 2022-08-28 MED ORDER — ASPIRIN 81 MG PO CHEW
81.0000 mg | CHEWABLE_TABLET | Freq: Every day | ORAL | Status: DC
  Administered 2022-08-28 – 2022-09-07 (×11): 81 mg
  Filled 2022-08-28 (×11): qty 1

## 2022-08-28 MED ORDER — SENNOSIDES 8.8 MG/5ML PO SYRP
10.0000 mL | ORAL_SOLUTION | Freq: Every day | ORAL | Status: DC
  Administered 2022-08-28 – 2022-09-01 (×5): 10 mL
  Filled 2022-08-28 (×5): qty 10

## 2022-08-28 MED ORDER — DOCUSATE SODIUM 50 MG/5ML PO LIQD
50.0000 mg | Freq: Every day | ORAL | Status: DC
  Administered 2022-08-28 – 2022-09-01 (×5): 50 mg
  Filled 2022-08-28 (×5): qty 10

## 2022-08-28 MED ORDER — POTASSIUM CHLORIDE 20 MEQ PO PACK
20.0000 meq | PACK | Freq: Once | ORAL | Status: AC
  Administered 2022-08-28: 20 meq
  Filled 2022-08-28: qty 1

## 2022-08-28 MED ORDER — SENNA 8.6 MG PO TABS: 1.0000 | ORAL_TABLET | Freq: Every day | ORAL | Status: DC

## 2022-08-28 MED ORDER — MAGNESIUM SULFATE 2 GM/50ML IV SOLN
2.0000 g | Freq: Once | INTRAVENOUS | Status: AC
  Administered 2022-08-28: 2 g via INTRAVENOUS
  Filled 2022-08-28: qty 50

## 2022-08-28 MED ORDER — POTASSIUM CHLORIDE 10 MEQ/50ML IV SOLN
10.0000 meq | INTRAVENOUS | Status: AC
  Administered 2022-08-28 (×2): 10 meq via INTRAVENOUS
  Filled 2022-08-28 (×2): qty 50

## 2022-08-28 MED ORDER — SODIUM CHLORIDE 0.9 % IV SOLN: INTRAVENOUS | Status: DC | PRN

## 2022-08-28 MED ORDER — POTASSIUM PHOSPHATES 15 MMOLE/5ML IV SOLN
15.0000 mmol | Freq: Once | INTRAVENOUS | Status: AC
  Administered 2022-08-28: 15 mmol via INTRAVENOUS
  Filled 2022-08-28: qty 5

## 2022-08-28 NOTE — Progress Notes (Signed)
LTM EEG re hook up and running - no initial skin breakdown - push button tested - [facility] monitoring.

## 2022-08-28 NOTE — Plan of Care (Signed)
  Problem: Skin Integrity: Goal: Risk for impaired skin integrity will decrease Outcome: Progressing Note: Continued Q2 hour turns with proper pillow placements and prevalon boots to reduce pressure injury risk

## 2022-08-28 NOTE — Progress Notes (Signed)
[facility] ADULT ICU REPLACEMENT PROTOCOL   The patient does apply for the [facility] Adult ICU Electrolyte Replacment Protocol based on the criteria listed below:   1.Exclusion criteria: TCTS, ECMO, Dialysis, and Myasthenia Gravis patients 2. Is GFR >/= 30 ml/min? Yes.    Patient's GFR today is >60 3. Is SCr </= 2? Yes.   Patient's SCr is 0.73 mg/dL 4. Did SCr increase >/= 0.5 in 24 hours? No. 5.Pt's weight >40kg  Yes.   6. Abnormal electrolyte(s):   K 3.2, Phos 2.1, Mg 1.9  7. Electrolytes replaced per protocol 8.  Call MD STAT for K+ </= 2.5, Phos </= 1, or Mag </= 1 Physician:  Avon Gully R Emie Sommerfeld 08/28/2022 6:36 AM

## 2022-08-28 NOTE — Progress Notes (Signed)
[place] for Heparin Indication: chest pain/ACS  Allergies  Allergen Reactions   Cephalexin Swelling    REACTION: tongue swells and becomes disoriented   Aspirin Nausea And Vomiting   Codeine Itching   Ketorolac Tromethamine Nausea And Vomiting   Morphine And Related Other (See Comments)    headache    Patient Measurements: Height: '5\' 5"'$  (165.1 cm) Weight: 63.5 kg (139 lb 15.9 oz) IBW/kg (Calculated) : 57 Heparin Dosing Weight: 72 kg  Vital Signs: Temp: 97.9 F (36.6 C) (12/16 0945) Temp Source: Esophageal (12/16 0715) BP: 97/49 (12/16 0945) Pulse Rate: 70 (12/16 0945)  Labs: Recent Labs    08/30/2022 1920 08/20/2022 2007 09/12/2022 2120 08/29/2022 2242 08/27/22 0508 08/27/22 0513 08/27/22 1342 08/28/22 0451 08/28/22 0855  HGB  --    < >  --  11.6* 11.6* 11.5*  --  9.2*  --   HCT  --    < >  --  36.0 34.0* 35.3*  --  28.4*  --   PLT  --   --   --  381  --  367  --  264  --   LABPROT  --   --  16.1*  --   --   --   --   --   --   INR  --   --  1.3*  --   --   --   --   --   --   HEPARINUNFRC  --   --   --   --   --  0.37 0.37  --  0.37  CREATININE 0.93  --   --   --   --  0.88  --  0.73  --   TROPONINIHS 98*  --  141*  --   --   --   --   --   --    < > = values in this interval not displayed.     Estimated Creatinine Clearance: 67.3 mL/min (by C-G formula based on SCr of 0.73 mg/dL).  Assessment: 61 y.o. female s/p cardiac arrest with VDRF and myoclonic status epilepticus . Pharmacy dosing heparin. Per discussion with MD, will plan continue heparin x 5 days unless bleeding occurs.  -heparin level of 0.37 at goal on 900 units/hr -Hgb down to 9.2, pltc to 264K  Goal of Therapy:  Heparin level 0.3-0.7 units/ml Monitor platelets by anticoagulation protocol: Yes   Plan:  -Continue heparin at 900 units/hr -Daily heparin level and CBC  Thank you for allowing pharmacy to participate in this patient's care.  Reatha Harps,  PharmD PGY2 Pharmacy Resident 08/28/2022 10:10 AM Check [email or web address] for unit specific pharmacy number

## 2022-08-28 NOTE — Progress Notes (Addendum)
Progress Note  Patient Name: Kaylee Murphy Date of Encounter: 08/28/2022  Primary Cardiologist: None  Subjective   No acute events overnight. Patient currently on propofol drip, Versed drip and Levophed.  After increasing the propofol drip, the seizure activity/twitching resolved.  However it had to be held briefly due to bradycardia as well. Patient intubated and sedated.  Inpatient Medications    Scheduled Meds:  Chlorhexidine Gluconate Cloth  6 each Topical Daily   insulin aspart  0-15 Units Subcutaneous Q4H   mouth rinse  15 mL Mouth Rinse Q2H   pantoprazole (PROTONIX) IV  40 mg Intravenous Daily   sodium chloride flush  10-40 mL Intracatheter Q12H   Continuous Infusions:  doxycycline (VIBRAMYCIN) IV Stopped (08/28/22 1610)   feeding supplement (VITAL AF 1.2 CAL) 60 mL/hr at 08/28/22 0800   heparin 900 Units/hr (08/28/22 0800)   levETIRAcetam Stopped (08/27/22 2219)   magnesium sulfate bolus IVPB 2 g (08/28/22 0815)   midazolam 10 mg/hr (08/28/22 0811)   norepinephrine (LEVOPHED) Adult infusion 1 mcg/min (08/28/22 0800)   potassium chloride 10 mEq (08/28/22 0816)   potassium PHOSPHATE IVPB (in mmol) 15 mmol (08/28/22 0802)   propofol (DIPRIVAN) infusion 120 mcg/kg/min (08/28/22 0801)   valproate sodium Stopped (08/28/22 0258)   PRN Meds: docusate sodium, midazolam, mouth rinse, polyethylene glycol, sodium chloride flush   Vital Signs    Vitals:   08/28/22 0730 08/28/22 0745 08/28/22 0800 08/28/22 0815  BP: 116/60 112/68 117/61 117/61  Pulse: (!) 57 (!) 58 60 61  Resp: (!) 22 18 (!) 22 (!) 22  Temp: (!) 94.6 F (34.8 C) (!) 95.2 F (35.1 C) (!) 96.1 F (35.6 C) (!) 96.6 F (35.9 C)  TempSrc:      SpO2: 100% 100% 100% 100%  Weight:      Height:        Intake/Output Summary (Last 24 hours) at 08/28/2022 0854 Last data filed at 08/28/2022 0800 Gross per 24 hour  Intake 3980.36 ml  Output 2275 ml  Net 1705.36 ml   Filed Weights   08/23/2022 1949  08/27/22 0300 08/28/22 0441  Weight: 75 kg 64.2 kg 63.5 kg    Telemetry     Personally reviewed, occasional PVCs  ECG   From 12/26/2021 showed normal sinus rhythm and prolonged QTc, 573 ms  Physical Exam   GEN: No acute distress.   Neck: No JVD. Cardiac: RRR, no murmur, rub, or gallop.  Respiratory: Nonlabored. Clear to auscultation bilaterally. GI: Soft, nontender, bowel sounds present. MS: No edema; No deformity. Neuro:  Nonfocal. Psych: Intubated and sedated  Labs    Chemistry Recent Labs  Lab 09/08/2022 1920 09/03/2022 2007 08/27/22 0508 08/27/22 0513 08/28/22 0451  NA 141   < > 140 138 137  K 3.2*   < > 3.6 3.7 3.2*  CL 111  --   --  106 106  CO2 16*  --   --  20* 20*  GLUCOSE 267*  --   --  137* 133*  BUN 7  --   --  10 13  CREATININE 0.93  --   --  0.88 0.73  CALCIUM 7.4*  --   --  8.3* 7.8*  PROT 6.1*  --   --   --   --   ALBUMIN 2.7*  --   --   --   --   AST 58*  --   --   --   --   ALT 37  --   --   --   --  ALKPHOS 88  --   --   --   --   BILITOT 0.2*  --   --   --   --   GFRNONAA >60  --   --  >60 >60  ANIONGAP 14  --   --  12 11   < > = values in this interval not displayed.     Hematology Recent Labs  Lab 08/13/2022 2242 08/27/22 0508 08/27/22 0513 08/28/22 0451  WBC 27.4*  --  27.9* 13.7*  RBC 4.39  --  4.29 3.35*  HGB 11.6* 11.6* 11.5* 9.2*  HCT 36.0 34.0* 35.3* 28.4*  MCV 82.0  --  82.3 84.8  MCH 26.4  --  26.8 27.5  MCHC 32.2  --  32.6 32.4  RDW 14.4  --  14.6 14.7  PLT 381  --  367 264    Cardiac Enzymes Recent Labs  Lab 09/03/2022 1920 08/23/2022 2120  TROPONINIHS 98* 141*    BNP Recent Labs  Lab 08/22/2022 2242  BNP 890.8*     DDimerNo results for input(s): "DDIMER" in the last 168 hours.   Radiology and cardiac studies    MR BRAIN WO CONTRAST  Result Date: 08/28/2022 CLINICAL DATA:  Anoxic brain injury EXAM: MRI HEAD WITHOUT CONTRAST TECHNIQUE: Multiplanar, multiecho pulse sequences of the brain and surrounding  structures were obtained without intravenous contrast. COMPARISON:  None Available. FINDINGS: Brain: No acute infarct, mass effect or extra-axial collection. No acute or chronic hemorrhage. Minimal multifocal hyperintense T2-weight signal within the white matter. A partially empty sella is incidentally noted. Vascular: Major flow voids are preserved. Skull and upper cervical spine: Normal calvarium and skull base. Visualized upper cervical spine and soft tissues are normal. Sinuses/Orbits:No paranasal sinus fluid levels or advanced mucosal thickening. No mastoid or middle ear effusion. Normal orbits. IMPRESSION: 1. No acute intracranial abnormality. 2. Minimal multifocal hyperintense T2-weight signal within the white matter, nonspecific but may be seen in the setting of chronic small vessel ischemia. Electronically Signed   By: Ulyses Jarred M.D.   On: 08/28/2022 00:17   ECHOCARDIOGRAM COMPLETE  Result Date: 08/27/2022    ECHOCARDIOGRAM REPORT   Patient Name:   Kaylee Murphy Date of Exam: 08/27/2022 Medical Rec #:  [id number]       Height:       65.0 in Accession #:    [id number]      Weight:       141.5 lb Date of Birth:  01/17/1961       BSA:          1.708 m Patient Age:    61 years        BP:           125/76 mmHg Patient Gender: F               HR:           61 bpm. Exam Location:  Inpatient Procedure: 2D Echo, Color Doppler and Cardiac Doppler Indications:    cardiac arrest  History:        Patient has prior history of Echocardiogram examinations, most                 recent 02/20/2022. Risk Factors:Diabetes, Hypertension,                 Dyslipidemia and Current Smoker.  Sonographer:    Johny Chess RDCS Referring Phys: [id number] Francesca Jewett  Sonographer Comments: Echo performed with patient supine and  on artificial respirator. IMPRESSIONS  1. Left ventricular ejection fraction, by estimation, is 30 to 35%. The left ventricle has moderately decreased function. The left ventricle demonstrates  global hypokinesis. There is mild left ventricular hypertrophy. Left ventricular diastolic parameters are indeterminate.  2. Right ventricular systolic function is normal. The right ventricular size is normal. Tricuspid regurgitation signal is inadequate for assessing PA pressure.  3. The mitral valve is normal in structure. No evidence of mitral valve regurgitation. No evidence of mitral stenosis.  4. The aortic valve is tricuspid. Aortic valve regurgitation is not visualized. Aortic valve sclerosis/calcification is present, without any evidence of aortic stenosis. FINDINGS  Left Ventricle: Left ventricular ejection fraction, by estimation, is 30 to 35%. The left ventricle has moderately decreased function. The left ventricle demonstrates global hypokinesis. The left ventricular internal cavity size was normal in size. There is mild left ventricular hypertrophy. Left ventricular diastolic parameters are indeterminate. Right Ventricle: The right ventricular size is normal. No increase in right ventricular wall thickness. Right ventricular systolic function is normal. Tricuspid regurgitation signal is inadequate for assessing PA pressure. Left Atrium: Left atrial size was normal in size. Right Atrium: Right atrial size was normal in size. Pericardium: Trivial pericardial effusion is present. Mitral Valve: The mitral valve is normal in structure. No evidence of mitral valve regurgitation. No evidence of mitral valve stenosis. Tricuspid Valve: The tricuspid valve is normal in structure. Tricuspid valve regurgitation is trivial. Aortic Valve: The aortic valve is tricuspid. Aortic valve regurgitation is not visualized. Aortic valve sclerosis/calcification is present, without any evidence of aortic stenosis. Pulmonic Valve: The pulmonic valve was not well visualized. Pulmonic valve regurgitation is trivial. Aorta: The aortic root and ascending aorta are structurally normal, with no evidence of dilitation. IAS/Shunts: The  interatrial septum was not well visualized.  LEFT VENTRICLE PLAX 2D LVIDd:         4.70 cm     Diastology LVIDs:         4.00 cm     LV e' medial:    4.35 cm/s LV PW:         1.10 cm     LV E/e' medial:  14.6 LV IVS:        1.10 cm     LV e' lateral:   5.33 cm/s LVOT diam:     2.20 cm     LV E/e' lateral: 11.9 LV SV:         51 LV SV Index:   30 LVOT Area:     3.80 cm  LV Volumes (MOD) LV vol d, MOD A4C: 98.5 ml LV vol s, MOD A4C: 72.6 ml LV SV MOD A4C:     98.5 ml RIGHT VENTRICLE             IVC RV Basal diam:  2.40 cm     IVC diam: 1.70 cm RV S prime:     12.60 cm/s TAPSE (M-mode): 1.2 cm LEFT ATRIUM             Index        RIGHT ATRIUM           Index LA diam:        3.20 cm 1.87 cm/m   RA Area:     15.20 cm LA Vol (A2C):   46.2 ml 27.05 ml/m  RA Volume:   36.60 ml  21.43 ml/m LA Vol (A4C):   54.9 ml 32.15 ml/m LA Biplane Vol: 54.5 ml 31.91 ml/m  AORTIC VALVE LVOT Vmax:   67.00 cm/s LVOT Vmean:  40.600 cm/s LVOT VTI:    0.134 m  AORTA Ao Root diam: 3.00 cm Ao Asc diam:  3.10 cm MITRAL VALVE MV Area (PHT): 2.71 cm    SHUNTS MV Decel Time: 280 msec    Systemic VTI:  0.13 m MV E velocity: 63.60 cm/s  Systemic Diam: 2.20 cm MV A velocity: 56.80 cm/s MV E/A ratio:  1.12 Oswaldo Milian MD Electronically signed by Oswaldo Milian MD Signature Date/Time: 08/27/2022/10:56:17 AM    Final    EEG adult  Result Date: 08/27/2022 Derek Jack, MD     08/27/2022 10:02 AM Routine EEG Report Kaylee Murphy is a 61 y.o. female with a history of cardiac arrest who is undergoing an EEG to evaluate for seizures. Report: This EEG was acquired with electrodes placed according to the International 10-20 electrode system (including Fp1, Fp2, F3, F4, C3, C4, P3, P4, O1, O2, T3, T4, T5, T6, A1, A2, Fz, Cz, Pz). The following electrodes were missing or displaced: none. The background consisted of a burst suppression pattern as follows: 1-2 polyspike and wave discharges alternating with 3-5 seconds of diffuse  suppression. The discharges were timelocked to full body myoclonic jerks reviewed by video. This activity was not clearly reactive to stimulation. There were no other seizure types noted in addition to the myoclonic seizures described above. Photic stimulation and hyperventilation were not performed. Impression and clinical correlation: This EEG was obtained while comatose and sedated on versed and fentanyl and is abnormal due to: - Burst suppression pattern - Myoclonic status epilepticus Both of these findings are associated with severe global cerebral dysfunction. Recommend continuous EEG. Su Monks, MD [facility] [phone] If 7pm- 7am, please page neurology on call as listed in [facility].   [place] EKG SITE RITE  Result Date: 08/27/2022 If Site Rite image not attached, placement could not be confirmed due to current cardiac rhythm.  DG Abd Portable 1V  Result Date: 08/31/2022 CLINICAL DATA:  Orogastric tube placement. EXAM: PORTABLE ABDOMEN - 1 VIEW COMPARISON:  None Available. FINDINGS: Tip and side port of the enteric tube below the diaphragm in the stomach. Moderate stool in the included colon. Nonobstructive upper abdominal bowel gas pattern. IMPRESSION: Tip and side port of the enteric tube below the diaphragm in the stomach. Electronically Signed   By: Keith Rake M.D.   On: 08/20/2022 21:01   CT HEAD WO CONTRAST (5MM)  Result Date: 08/25/2022 CLINICAL DATA:  Seizure EXAM: CT HEAD WITHOUT CONTRAST TECHNIQUE: Contiguous axial images were obtained from the base of the skull through the vertex without intravenous contrast. RADIATION DOSE REDUCTION: This exam was performed according to the departmental dose-optimization program which includes automated exposure control, adjustment of the mA and/or kV according to patient size and/or use of iterative reconstruction technique. COMPARISON:  02/19/2022 FINDINGS: Brain: No acute infarct or hemorrhage. Lateral ventricles and midline  structures are unremarkable. No acute extra-axial fluid collections. No mass effect. Vascular: No hyperdense vessel or unexpected calcification. Skull: Normal. Negative for fracture or focal lesion. Sinuses/Orbits: No acute finding. Other: None. IMPRESSION: 1. No acute intracranial process. Electronically Signed   By: Randa Ngo M.D.   On: 08/23/2022 20:27   DG Chest Portable 1 View  Result Date: 08/13/2022 CLINICAL DATA:  Intubated, status post CPR EXAM: PORTABLE CHEST 1 VIEW COMPARISON:  11/29/2017 FINDINGS: Single frontal view of the chest demonstrates endotracheal tube overlying tracheal air column, tip approximately 1.5 cm above carina.  External defibrillator pads overlie the chest. The cardiac silhouette is enlarged. There is diffuse interstitial and ground-glass opacities, favor edema. No effusion or pneumothorax. No displaced fractures. IMPRESSION: 1. Diffuse interstitial and ground-glass opacities, favor edema. 2. No complication after intubation. Electronically Signed   By: Randa Ngo M.D.   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  # Out-of-hospital V-fib arrest (reportedly had seizure-like activity followed by syncope, ROSC achieved in 15/?50 minutes). She had body twitching movements upon arrival to the ER. EEG showed findings consistent with diffuse anoxic brain injury. Currently on propofol drip, Versed drip and Levophed.  Neurology closely following. No plans for invasive ischemic evaluation until patient has reasonable neurological recovery.  # New onset cardiomyopathy, LVEF 30 to 35% (prior echo from 6/23 showed LVEF 45% with inferior and inferolateral wall hypokinesis) -Patient currently on norepinephrine drip.  GDMT will need to be initiated once off  pressors. -No plans for invasive ischemic evaluation until patient has recent bowel neurological recovery.  I have spent a total of 33 minutes with patient reviewing chart , telemetry, EKGs, labs and examining patient as well as establishing an assessment and plan that was discussed with the patient.  > 50% of time was spent in direct patient care.      Signed, Chalmers Guest, MD  08/28/2022, 8:54 AM

## 2022-08-28 NOTE — Progress Notes (Signed)
NEUROLOGY CONSULTATION PROGRESS NOTE   Date of service: August 28, 2022 Patient Name: Kaylee Murphy MRN:  [id number] DOB:  05/24/1961  Brief HPI  Kaylee Murphy is a 61 year old female who presented to the emergency room postcardiac arrest. Had seizure-like activity and then lost pulses. Had a V-fib arrest requiring CPR. EEG shows myoclonic status and burst suppression pattern suggestive of being secondary to diffuse anoxic brain injury.     Interval Hx  12/15 late pm prognostic MRI reads "no acute abnormality" on official report but see below discussion on findings by Neurology team upon personal review of the images.   No events. Intubated and heavily sedated on propofol and versed. Remains in burst suppression on EEG with myoclonus. Son at bedside  Vitals   Vitals:   08/28/22 1400 08/28/22 1415 08/28/22 1430 08/28/22 1445  BP: (!) 104/56 (!) 93/57 102/68 (!) 98/59  Pulse: 76 76 74 73  Resp: (!) 22 (!) 22 (!) 22 (!) 22  Temp: 99.1 F (37.3 C) 99.7 F (37.6 C) 99.9 F (37.7 C) 99.5 F (37.5 C)  TempSrc:      SpO2: 99% 100% 99% 99%  Weight:      Height:         Body mass index is 23.3 kg/m.  Physical Exam   General: Critically ill female lying in bed.  HENT: Normal oropharynx and mucosa. ETT present. CV: No JVD. +2 peripheral edema in all extremities. Occasional Vtach seen on monitor.  Pulmonary: Symmetric Chest rise. Ventilator support PRVC 440/22/40/5.  Abdomen: Soft to touch. Ext: No cyanosis, or deformity, +2 edema.  Skin: No rash or open wounds. Normal palpation of skin.  Musculoskeletal: Normal digits and nails by inspection. No clubbing.   Neurologic Examination   Patient intubated and heavily sedated with a RAAS of -4/-5. No eye opening, no response to commands. No response to noxious stimuli. No corneal, cough or gag reflexes. Unable to assess coordination.   Labs   Basic Metabolic Panel:  Lab Results  Component Value Date   NA 141 08/28/2022   K  4.1 08/28/2022   CO2 22 08/28/2022   GLUCOSE 133 (H) 08/28/2022   BUN 14 08/28/2022   CREATININE 0.76 08/28/2022   CALCIUM 7.9 (L) 08/28/2022   GFRNONAA >60 08/28/2022   GFRAA >60 11/29/2017   HbA1c:  Lab Results  Component Value Date   HGBA1C 7.5 (H) 08/27/2022   LDL:  Lab Results  Component Value Date   LDLCALC 103 (H) 02/20/2022   Urine Drug Screen:     Component Value Date/Time   LABOPIA NONE DETECTED 08/28/2022 2140   COCAINSCRNUR NONE DETECTED 08/25/2022 2140   COCAINSCRNUR NEG 06/22/2010 2104   LABBENZ POSITIVE (A) 09/03/2022 2140   LABBENZ NEG 06/22/2010 2104   AMPHETMU NONE DETECTED 08/17/2022 2140   THCU NONE DETECTED 09/06/2022 2140   LABBARB NONE DETECTED 08/18/2022 2140    Alcohol Level     Component Value Date/Time   ETH <10 08/29/2022 1930   No results found for: "PHENYTOIN", "ZONISAMIDE", "LAMOTRIGINE", "LEVETIRACETA" No results found for: "PHENYTOIN", "PHENOBARB", "VALPROATE", "CBMZ"  Imaging and Diagnostic studies  Results for orders placed during the hospital encounter of 08/13/2022  ECHOCARDIOGRAM COMPLETE 1. Left ventricular ejection fraction, by estimation, is 30 to 35%. The left ventricle has moderately decreased function. The left ventricle demonstrates global hypokinesis. There is mild left ventricular hypertrophy. Left ventricular diastolic parameters are indeterminate. 2. Right ventricular systolic function is normal. The right ventricular size is normal.  Tricuspid regurgitation signal is inadequate for assessing PA pressure. 3. The mitral valve is normal in structure. No evidence of mitral valve regurgitation. No evidence of mitral stenosis. 4. The aortic valve is tricuspid. Aortic valve regurgitation is not visualized. Aortic valve sclerosis/calcification is present, without any evidence of aortic stenosis.   Impression  61 year old female who presented to the emergency room postcardiac arrest. She had some seizure-like activity and then  lost pulses. Had a V-fib arrest requiring CPR. Has had whole body twitching movements since arrival.   12/15 late pm prognostic MRI reads no acute abnormality.  - MRI brain completed Friday at 2342.  - Official Radiology report states that there is no acute intracranial abnormality, with minimal nonspecific multifocal chronic small vessel ischemic changes on the T2-weighted images. - On personal review of the images, there appears in fact to be widespread subtle but abnormal restricted diffusion and FLAIR signal involving most if not all of the cerebral cortex, in addition to symmetric diffuse elevated FLAIR and DWI signal involving the basal ganglia, most consistent with severe diffuse anoxic brain injury.  - LTM EEG report for this morning (Saturday): Myoclonic seizure, generalized; burst suppression with highly epileptiform bursts, generalized. This study showed myoclonic seizures on 08/27/2022 at around 0830 and 1721. EEG also showed evidence of epileptogenicity with generalized onset and increased risk of seizure recurrence. The findings are also suggestive of severe to profound diffuse encephalopathy. This overall EEG pattern is concerning for anoxic-hypoxic brain injury.   - Prognosis for a meaningful neurological recovery is felt to be dismal.   Recommendations  Continue LTM EEG for now.  Continue Propofol gtt at 120 mcg/kg/min. Increasing propofol infusion rate in an attempt to control myoclonus on EEG will not change the patient's neurological outcome but could result in acute severe drop in BP Continue Versed gtt at 10 mg/hr (rcvd bolus of '10mg'$  12/15) Continue Depakote 500 mg BID Continue Keppra 1000 mg BID (rcvd loading dose of '2000mg'$  12/15) Appreciate CCM assistance in the case Recommend palliative consult ______________________________________________________________________   Electronically signed: Dr. Kerney Elbe

## 2022-08-28 NOTE — Progress Notes (Signed)
NAME:  Kaylee Murphy, MRN:  [id number], DOB:  12-24-1960, LOS: 2 ADMISSION DATE:  09/03/2022, CONSULTATION DATE:  08/28/22 REFERRING MD:  Tegeler, CHIEF COMPLAINT:  cardiac arrest   History of Present Illness:  Kaylee Murphy is a 61 y.o. 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  History taken from Epic and report as no family is available; apparently she had seizure-like activity and EMS was called.  Pt was awake, but altered on arrival and then had a syncopal episode with loss of pulses.  Initial rhythm was Vfib. She underwent approximately 50 minutes of CPR with a run of Torsades and was intubated by EMS.   In the ED, ETT changed without sedation, then patient began moving and had purposeful reaching for tube.   At the time of PCCM consult, no labs or head CT available. She initially required an epinephrine gtt, this was weaned off.  EKG without STEMI and Cardiology was also consulted.  Pertinent  Medical History   has a past medical history of Anemia, Anxiety, Chronic back pain, Chronic shoulder pain (10 years), Depression, Diabetes mellitus, Hyperlipidemia, and Hypertension.   Significant Hospital Events: Including procedures, antibiotic start and stop dates in addition to other pertinent events   12/14 witnessed out of hospital vfib arrest, 50 mins CPR  Interim History / Subjective:  No events. Remains in burst suppression. Tele benign  Objective   Blood pressure 98/60, pulse 75, temperature 98.4 F (36.9 C), resp. rate (!) 22, height '5\' 5"'$  (1.651 m), weight 63.5 kg, last menstrual period 02/13/2014, SpO2 99 %.    Vent Mode: PRVC FiO2 (%):  [40 %] 40 % Set Rate:  [22 bmp] 22 bmp Vt Set:  [440 mL] 440 mL PEEP:  [5 cmH20] 5 cmH20 Plateau Pressure:  [16 cmH20-18 cmH20] 17 cmH20   Intake/Output Summary  (Last 24 hours) at 08/28/2022 1048 Last data filed at 08/28/2022 1000 Gross per 24 hour  Intake 4368.37 ml  Output 2215 ml  Net 2153.37 ml    Filed Weights   08/23/2022 1949 08/27/22 0300 08/28/22 0441  Weight: 75 kg 64.2 kg 63.5 kg   Pupils dilated but reactive GCS 3 on current sedation Passive on vent, lungs clear ext warm Heart sounds regular No edema  K repleted MRI reviewed  Resolved Hospital Problem list     Assessment & Plan:  A:  OHCA Vfib, with prolonged Qtc on ROSC; seizure like activity concurrent   Post arrest SE vs. Myoclonus   NSTEMI   Acute hypoxemic resp failure with bilateral infiltrates vs. Edema, leukocytosis   DM2 with hyperglycemia; improved  Her lactate and relatively preserved organ function argue that she was not in a low flow state as long as we think.  MRI preserved for now (good rule in, bad rule out).  P:  - Vent support, mental status precludes extubation - Burst suppression, AEDs per neurology - Doxy for ?aspiration - SSI - Heparin gtt, LHC if meaningful neurological recovery  Overall just supportive care and see how much she will wake up.  Guarded prognosis.  Best Practice (right click and "Reselect all SmartList Selections" daily)   Diet/type: TF DVT prophylaxis: heparin gtt GI prophylaxis: PPI Lines: N/A Foley:  Yes, and it is still needed Code Status:  full code Last date of  multidisciplinary goals of care discussion [updated at bedside]  34 min cc time Erskine Emery MD PCCM

## 2022-08-28 NOTE — Procedures (Signed)
Patient Name: Kaylee Murphy  MRN: [id number]  Epilepsy Attending: Lora Havens  Referring Physician/Provider: Elease Etienne, PA  Duration: 08/27/2022 0544 to 1739, 08/28/2022 0444 to 1700  Patient history:  61 y.o. female with a history of cardiac arrest who is undergoing an EEG to evaluate for seizures.   Level of alertness: comatose  AEDs during EEG study: Versed, propofol, LEV, VPA  Technical aspects: This EEG study was done with scalp electrodes positioned according to the 10-20 International system of electrode placement. Electrical activity was reviewed with band pass filter of 1-'70Hz'$ , sensitivity of 7 uV/mm, display speed of 17m/sec with a '60Hz'$  notched filter applied as appropriate. EEG data were recorded continuously and digitally stored.  Video monitoring was available and reviewed as appropriate.  Description: At the beginning of study, EEG showed continuous generalized background suppression. Generalized highly epileptiform bursts were noted every 1-10 seconds. EEG was not reactive to tactile stimulation. Patient was disconnected from EEG between 08/27/2022 1739 to 08/28/2022 0444. After 08/28/2022 0444, EEG showed low amplitude generalized 8-'9hz'$  alpha activity admixed with burst of generalized polyspikes every 5-15 seconds. Gradually, the frequency of bursts increased and were noted every 1-3 seconds.   Event button was pressed on 08/27/2022 at 0830 during which patient was noted to have brief jerking of bilateral upper >lower extremity. Concomitant EEG showed generalized polyspikes consistent with myoclonic seizures.   Event button was pressed on 08/27/2022 at 1721 during which patient was noted to have rhythmic twitching of lower lip. Concomitant EEG showed generalized polyspikes consistent with myoclonic seizures.  Hyperventilation and photic stimulation were not performed.     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 EEG also showed evidence of epileptogenicity with generalized onset and increased risk of seizure recurrence. Additionally study was suggestive of severe to profound diffuse encephalopathy. This eeg pattern is concerning for anoxic-hypoxic brain injury.    Lavern Crimi OBarbra Sarks

## 2022-08-29 ENCOUNTER — Other Ambulatory Visit: Payer: Self-pay

## 2022-08-29 DIAGNOSIS — R569 Unspecified convulsions: Secondary | ICD-10-CM | POA: Diagnosis not present

## 2022-08-29 DIAGNOSIS — R4182 Altered mental status, unspecified: Secondary | ICD-10-CM | POA: Diagnosis not present

## 2022-08-29 DIAGNOSIS — I469 Cardiac arrest, cause unspecified: Secondary | ICD-10-CM | POA: Diagnosis not present

## 2022-08-29 LAB — CBC
HCT: 25.9 % — ABNORMAL LOW (ref 36.0–46.0)
Hemoglobin: 9.1 g/dL — ABNORMAL LOW (ref 12.0–15.0)
MCH: 29.5 pg (ref 26.0–34.0)
MCHC: 35.1 g/dL (ref 30.0–36.0)
MCV: 84.1 fL (ref 80.0–100.0)
Platelets: 237 10*3/uL (ref 150–400)
RBC: 3.08 MIL/uL — ABNORMAL LOW (ref 3.87–5.11)
RDW: 15.3 % (ref 11.5–15.5)
WBC: 13 10*3/uL — ABNORMAL HIGH (ref 4.0–10.5)
nRBC: 0.2 % (ref 0.0–0.2)

## 2022-08-29 LAB — GLUCOSE, CAPILLARY
Glucose-Capillary: 103 mg/dL — ABNORMAL HIGH (ref 70–99)
Glucose-Capillary: 135 mg/dL — ABNORMAL HIGH (ref 70–99)
Glucose-Capillary: 156 mg/dL — ABNORMAL HIGH (ref 70–99)
Glucose-Capillary: 174 mg/dL — ABNORMAL HIGH (ref 70–99)
Glucose-Capillary: 179 mg/dL — ABNORMAL HIGH (ref 70–99)
Glucose-Capillary: 191 mg/dL — ABNORMAL HIGH (ref 70–99)

## 2022-08-29 LAB — PHOSPHORUS: Phosphorus: 2.1 mg/dL — ABNORMAL LOW (ref 2.5–4.6)

## 2022-08-29 LAB — BASIC METABOLIC PANEL
Anion gap: 5 (ref 5–15)
BUN: 8 mg/dL (ref 6–20)
CO2: 22 mmol/L (ref 22–32)
Calcium: 7.2 mg/dL — ABNORMAL LOW (ref 8.9–10.3)
Chloride: 112 mmol/L — ABNORMAL HIGH (ref 98–111)
Creatinine, Ser: 0.61 mg/dL (ref 0.44–1.00)
GFR, Estimated: >60 mL/min (ref >60)
Glucose, Bld: 168 mg/dL — ABNORMAL HIGH (ref 70–99)
Potassium: 3.6 mmol/L (ref 3.5–5.1)
Sodium: 139 mmol/L (ref 135–145)

## 2022-08-29 LAB — HEPARIN LEVEL (UNFRACTIONATED)
Heparin Unfractionated: 0.12 IU/mL — ABNORMAL LOW (ref 0.30–0.70)
Heparin Unfractionated: 0.37 IU/mL (ref 0.30–0.70)

## 2022-08-29 LAB — MAGNESIUM: Magnesium: 2 mg/dL (ref 1.7–2.4)

## 2022-08-29 MED ORDER — POTASSIUM PHOSPHATES 15 MMOLE/5ML IV SOLN
15.0000 mmol | Freq: Once | INTRAVENOUS | Status: AC
  Administered 2022-08-29: 15 mmol via INTRAVENOUS
  Filled 2022-08-29: qty 5

## 2022-08-29 MED ORDER — ACETAMINOPHEN 160 MG/5ML PO SOLN
650.0000 mg | Freq: Four times a day (QID) | ORAL | Status: AC | PRN
  Administered 2022-08-29 – 2022-08-31 (×3): 650 mg
  Filled 2022-08-29 (×3): qty 20.3

## 2022-08-29 NOTE — Plan of Care (Signed)
Pt is non-interactive and no family present at time.

## 2022-08-29 NOTE — Progress Notes (Signed)
NAME:  Kaylee Murphy, MRN:  [id number], DOB:  [birth year], LOS: 3 ADMISSION DATE:  09/03/2022, CONSULTATION DATE:  08/29/22 REFERRING MD:  Tegeler, CHIEF COMPLAINT:  cardiac arrest   History of Present Illness:  Kaylee Murphy is a 61 y.o. 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  History taken from Epic and report as no family is available; apparently she had seizure-like activity and EMS was called.  Pt was awake, but altered on arrival and then had a syncopal episode with loss of pulses.  Initial rhythm was Vfib. She underwent approximately 50 minutes of CPR with a run of Torsades and was intubated by EMS.   In the ED, ETT changed without sedation, then patient began moving and had purposeful reaching for tube.   At the time of PCCM consult, no labs or head CT available. She initially required an epinephrine gtt, this was weaned off.  EKG without STEMI and Cardiology was also consulted.  Pertinent  Medical History   has a past medical history of Anemia, Anxiety, Chronic back pain, Chronic shoulder pain (10 years), Depression, Diabetes mellitus, Hyperlipidemia, and Hypertension.   Significant Hospital Events: Including procedures, antibiotic start and stop dates in addition to other pertinent events   12/14 witnessed out of hospital vfib arrest, 50 mins CPR  Interim History / Subjective:  No events. Heavily sedated.  Objective   Blood pressure 116/81, pulse 72, temperature 98.4 F (36.9 C), resp. rate (!) 22, height '5\' 5"'$  (1.651 m), weight 65.1 kg, last menstrual period 02/13/2014, SpO2 99 %. CVP:  [2 mmHg-8 mmHg] 4 mmHg  Vent Mode: PRVC FiO2 (%):  [40 %] 40 % Set Rate:  [22 bmp] 22 bmp Vt Set:  [440 mL] 440 mL PEEP:  [5 cmH20] 5 cmH20 Plateau Pressure:  [16 cmH20-18 cmH20] 17 cmH20   Intake/Output Summary  (Last 24 hours) at 08/29/2022 1342 Last data filed at 08/29/2022 1300 Gross per 24 hour  Intake 5033.77 ml  Output 2505 ml  Net 2528.77 ml    Filed Weights   08/27/22 0300 08/28/22 0441 08/29/22 0500  Weight: 64.2 kg 63.5 kg 65.1 kg   Pupils dilated but reactive GCS 3 on current sedation Passive on vent, lungs clear ext warm Heart sounds regular No edema Minimal tracheal secretions  K/phos repleted CBC stable  Resolved Hospital Problem list     Assessment & Plan:  A:  OHCA Vfib, with prolonged Qtc on ROSC; seizure like activity concurrent   Post arrest SE vs. Myoclonus   NSTEMI   Acute hypoxemic resp failure with bilateral infiltrates vs. Edema, leukocytosis   DM2 with hyperglycemia; improved  Her lactate and relatively preserved organ function argue that she was not in a low flow state as long as we think.  MRI per radiology looks okay, neurology thinks fairly widespread restricted diffusion.  P:  - Vent support, mental status precludes extubation - Burst suppression, AEDs per neurology - Doxy for ?aspiration - SSI - Heparin gtt, LHC if meaningful neurological recovery  I think tomorrow start weaning sedation and if recurrent seizures need GOC discussion. Will consult palliative.  Best Practice (right click and "Reselect all SmartList Selections" daily)   Diet/type: TF DVT prophylaxis: heparin gtt GI prophylaxis: PPI Lines: N/A Foley:  Yes, and it is still needed Code Status:  full code Last date of multidisciplinary goals of care discussion [updated at bedside 12/16, trying to reach them again today]  31 min cc time Erskine Emery MD PCCM

## 2022-08-29 NOTE — Progress Notes (Signed)
[facility] ADULT ICU REPLACEMENT PROTOCOL   The patient does apply for the [facility] Adult ICU Electrolyte Replacment Protocol based on the criteria listed below:   1.Exclusion criteria: TCTS, ECMO, Dialysis, and Myasthenia Gravis patients 2. Is GFR >/= 30 ml/min? Yes.    Patient's GFR today is >60 3. Is SCr </= 2? Yes.   Patient's SCr is 0.61 mg/dL 4. Did SCr increase >/= 0.5 in 24 hours? No. 5.Pt's weight >40kg  Yes.   6. Abnormal electrolyte(s): Phos 2.1, K+ 3.6  7. Electrolytes replaced per protocol 8.  Call MD STAT for K+ </= 2.5, Phos </= 1, or Mag </= 1 Physician:  Dr. Nicki Guadalajara, [place]bot Grumbling 08/29/2022 3:05 AM

## 2022-08-29 NOTE — Progress Notes (Signed)
[place] for Heparin Indication: chest pain/ACS  Allergies  Allergen Reactions   Cephalexin Swelling    REACTION: tongue swells and becomes disoriented   Aspirin Nausea And Vomiting   Codeine Itching   Ketorolac Tromethamine Nausea And Vomiting   Morphine And Related Other (See Comments)    headache    Patient Measurements: Height: '5\' 5"'$  (165.1 cm) Weight: 65.1 kg (143 lb 8.3 oz) IBW/kg (Calculated) : 57 Heparin Dosing Weight: 72 kg  Vital Signs: Temp: 98.2 F (36.8 C) (12/17 0545) Temp Source: Esophageal (12/17 0400) BP: 108/55 (12/17 0545) Pulse Rate: 70 (12/17 0545)  Labs: Recent Labs    08/21/2022 1920 08/20/2022 2007 09/05/2022 2120 09/03/2022 2242 08/27/22 0513 08/27/22 1342 08/28/22 0451 08/28/22 0855 08/28/22 1340 08/29/22 0216 08/29/22 0410  HGB  --    < >  --    < > 11.5*  --  9.2*  --   --  9.1*  --   HCT  --    < >  --    < > 35.3*  --  28.4*  --   --  25.9*  --   PLT  --   --   --    < > 367  --  264  --   --  237  --   LABPROT  --   --  16.1*  --   --   --   --   --   --   --   --   INR  --   --  1.3*  --   --   --   --   --   --   --   --   HEPARINUNFRC  --   --   --    < > 0.37 0.37  --  0.37  --   --  0.12*  CREATININE 0.93  --   --   --  0.88  --  0.73  --  0.76 0.61  --   TROPONINIHS 98*  --  141*  --   --   --   --   --   --   --   --    < > = values in this interval not displayed.     Estimated Creatinine Clearance: 67.3 mL/min (by C-G formula based on SCr of 0.61 mg/dL).  Assessment: 61 y.o. female s/p cardiac arrest with VDRF and myoclonic status epilepticus . Pharmacy dosing heparin. Per discussion with MD, will plan continue heparin x 5 days unless bleeding occurs.  -heparin level of 0.37 at goal on 900 units/hr -Hgb down to 9.2, pltc to 264K  12/17 AM update:  Heparin level sub-therapeutic   Goal of Therapy:  Heparin level 0.3-0.7 units/ml Monitor platelets by anticoagulation protocol: Yes   Plan:   Inc heparin to 1000 units/hr 1400 heparin level  Narda Bonds, PharmD, BCPS Clinical Pharmacist Phone: [phone]

## 2022-08-29 NOTE — Procedures (Signed)
Patient Name: Kaylee Murphy  MRN: [id number]  Epilepsy Attending: Lora Havens  Referring Physician/Provider: Elease Etienne, PA  Duration: 08/28/2022 1700 to 08/29/2022 1700   Patient history:  61 y.o. female with a history of cardiac arrest who is undergoing an EEG to evaluate for seizures.    Level of alertness: comatose   AEDs during EEG study: Versed, propofol, LEV, VPA   Technical aspects: This EEG study was done with scalp electrodes positioned according to the 10-20 International system of electrode placement. Electrical activity was reviewed with band pass filter of 1-'70Hz'$ , sensitivity of 7 uV/mm, display speed of 30m/sec with a '60Hz'$  notched filter applied as appropriate. EEG data were recorded continuously and digitally stored.  Video monitoring was available and reviewed as appropriate.   Description: EEG showed near continuous polymorphic mixed frequencies with predominantly 3-'6hz'$  theta-delta slowing admixed with 12-'15hz'$  generalized beta activity. Generalized spikes were noted with fluctuating frequency of 1-'2hz'$  which gradually improved and appeared less periodic. Hyperventilation and photic stimulation were not performed.      ABNORMALITY - Periodic spikes, generalized - Continuous slow, generalized    IMPRESSION: This study showed evidence of epileptogenicity with generalized onset and increased risk of seizure recurrence. Additionally there was severe diffuse encephalopathy. This eeg pattern is concerning for anoxic-hypoxic brain injury.    Of note, EEG appears to be improving compared to previous day.   Kaylee Murphy

## 2022-08-29 NOTE — Progress Notes (Signed)
eLink Physician-Brief Progress Note Patient Name: Kaylee Murphy DOB: [birth year] MRN: [id number]   Date of Service  08/29/2022  HPI/Events of Note  Notified of temp 100.2  on vent with OG and PICC, asking for Tylenol via tube LFT's on  12/14 AST 58/ ALT 37 Concern for anoxic injury On Doxycycline for pneumonia Cultures NGTD  eICU Interventions  Tylenol prn ordered     Intervention Category Intermediate Interventions: Other:  Judd Lien 08/29/2022, 10:48 PM

## 2022-08-29 NOTE — Progress Notes (Signed)
[place] for Heparin Indication: chest pain/ACS  Allergies  Allergen Reactions   Cephalexin Swelling    REACTION: tongue swells and becomes disoriented   Aspirin Nausea And Vomiting   Codeine Itching   Ketorolac Tromethamine Nausea And Vomiting   Morphine And Related Other (See Comments)    headache    Patient Measurements: Height: '5\' 5"'$  (165.1 cm) Weight: 65.1 kg (143 lb 8.3 oz) IBW/kg (Calculated) : 57 Heparin Dosing Weight: 72 kg  Vital Signs: Temp: 99.1 F (37.3 C) (12/17 1430) Temp Source: Esophageal (12/17 1100) BP: 136/68 (12/17 1430) Pulse Rate: 73 (12/17 1430)  Labs: Recent Labs    09/05/2022 1920 09/03/2022 2007 08/31/2022 2120 08/20/2022 2242 08/27/22 0513 08/27/22 1342 08/28/22 0451 08/28/22 0855 08/28/22 1340 08/29/22 0216 08/29/22 0410  HGB  --    < >  --    < > 11.5*  --  9.2*  --   --  9.1*  --   HCT  --    < >  --    < > 35.3*  --  28.4*  --   --  25.9*  --   PLT  --   --   --    < > 367  --  264  --   --  237  --   LABPROT  --   --  16.1*  --   --   --   --   --   --   --   --   INR  --   --  1.3*  --   --   --   --   --   --   --   --   HEPARINUNFRC  --   --   --    < > 0.37 0.37  --  0.37  --   --  0.12*  CREATININE 0.93  --   --   --  0.88  --  0.73  --  0.76 0.61  --   TROPONINIHS 98*  --  141*  --   --   --   --   --   --   --   --    < > = values in this interval not displayed.     Estimated Creatinine Clearance: 67.3 mL/min (by C-G formula based on SCr of 0.61 mg/dL).  Assessment: 61 y.o. female s/p cardiac arrest with VDRF and myoclonic status epilepticus . Pharmacy dosing heparin. Per discussion with MD, will plan continue heparin x 5 days unless bleeding occurs.  -heparin level of 0.37 at goal on 1000 units/hr -Hgb stable at 9.1 and pltc at 237K  Goal of Therapy:  Heparin level 0.3-0.7 units/ml Monitor platelets by anticoagulation protocol: Yes   Plan:  -Continue heparin at 1000 units/hr -Will  confirm heparin level with AM labs -Daily heparin level and CBC  Thank you for allowing pharmacy to participate in this patient's care.  Reatha Harps, PharmD PGY2 Pharmacy Resident 08/29/2022 2:37 PM Check [email or web address] for unit specific pharmacy number

## 2022-08-30 DIAGNOSIS — R4182 Altered mental status, unspecified: Secondary | ICD-10-CM | POA: Diagnosis not present

## 2022-08-30 DIAGNOSIS — I959 Hypotension, unspecified: Secondary | ICD-10-CM

## 2022-08-30 DIAGNOSIS — I469 Cardiac arrest, cause unspecified: Secondary | ICD-10-CM | POA: Diagnosis not present

## 2022-08-30 DIAGNOSIS — R569 Unspecified convulsions: Secondary | ICD-10-CM | POA: Diagnosis not present

## 2022-08-30 DIAGNOSIS — I5021 Acute systolic (congestive) heart failure: Secondary | ICD-10-CM

## 2022-08-30 LAB — LEGIONELLA PNEUMOPHILA SEROGP 1 UR AG: L. pneumophila Serogp 1 Ur Ag: NEGATIVE

## 2022-08-30 LAB — HEPARIN LEVEL (UNFRACTIONATED): Heparin Unfractionated: 0.24 IU/mL — ABNORMAL LOW (ref 0.30–0.70)

## 2022-08-30 LAB — GLUCOSE, CAPILLARY
Glucose-Capillary: 136 mg/dL — ABNORMAL HIGH (ref 70–99)
Glucose-Capillary: 182 mg/dL — ABNORMAL HIGH (ref 70–99)
Glucose-Capillary: 197 mg/dL — ABNORMAL HIGH (ref 70–99)
Glucose-Capillary: 201 mg/dL — ABNORMAL HIGH (ref 70–99)
Glucose-Capillary: 206 mg/dL — ABNORMAL HIGH (ref 70–99)
Glucose-Capillary: 220 mg/dL — ABNORMAL HIGH (ref 70–99)
Glucose-Capillary: 228 mg/dL — ABNORMAL HIGH (ref 70–99)

## 2022-08-30 LAB — BASIC METABOLIC PANEL
Anion gap: 6 (ref 5–15)
BUN: 6 mg/dL (ref 6–20)
CO2: 23 mmol/L (ref 22–32)
Calcium: 7.4 mg/dL — ABNORMAL LOW (ref 8.9–10.3)
Chloride: 109 mmol/L (ref 98–111)
Creatinine, Ser: 0.55 mg/dL (ref 0.44–1.00)
GFR, Estimated: >60 mL/min (ref >60)
Glucose, Bld: 195 mg/dL — ABNORMAL HIGH (ref 70–99)
Potassium: 3.7 mmol/L (ref 3.5–5.1)
Sodium: 138 mmol/L (ref 135–145)

## 2022-08-30 LAB — CBC
HCT: 26.8 % — ABNORMAL LOW (ref 36.0–46.0)
Hemoglobin: 9.2 g/dL — ABNORMAL LOW (ref 12.0–15.0)
MCH: 28.7 pg (ref 26.0–34.0)
MCHC: 34.3 g/dL (ref 30.0–36.0)
MCV: 83.5 fL (ref 80.0–100.0)
Platelets: 232 10*3/uL (ref 150–400)
RBC: 3.21 MIL/uL — ABNORMAL LOW (ref 3.87–5.11)
RDW: 15.6 % — ABNORMAL HIGH (ref 11.5–15.5)
WBC: 13.5 10*3/uL — ABNORMAL HIGH (ref 4.0–10.5)
nRBC: 0.2 % (ref 0.0–0.2)

## 2022-08-30 LAB — TRIGLYCERIDES: Triglycerides: 988 mg/dL — ABNORMAL HIGH (ref <150)

## 2022-08-30 LAB — VALPROIC ACID LEVEL: Valproic Acid Lvl: 43 ug/mL — ABNORMAL LOW (ref 50.0–100.0)

## 2022-08-30 MED ORDER — LEVETIRACETAM IN NACL 1000 MG/100ML IV SOLN
1000.0000 mg | INTRAVENOUS | Status: AC
  Administered 2022-08-30 (×2): 1000 mg via INTRAVENOUS
  Filled 2022-08-30 (×2): qty 100

## 2022-08-30 MED ORDER — SODIUM CHLORIDE 0.9 % IV SOLN: 2000.0000 mg | Freq: Once | INTRAVENOUS | Status: DC

## 2022-08-30 MED ORDER — VALPROATE SODIUM 100 MG/ML IV SOLN
1000.0000 mg | Freq: Once | INTRAVENOUS | Status: AC
  Administered 2022-08-31: 1000 mg via INTRAVENOUS
  Filled 2022-08-30: qty 10

## 2022-08-30 MED ORDER — GLYCOPYRROLATE 1 MG PO TABS
1.0000 mg | ORAL_TABLET | Freq: Once | ORAL | Status: AC
  Administered 2022-08-30: 1 mg
  Filled 2022-08-30 (×2): qty 1

## 2022-08-30 MED ORDER — POTASSIUM CHLORIDE 20 MEQ PO PACK
60.0000 meq | PACK | Freq: Once | ORAL | Status: AC
  Administered 2022-08-30: 60 meq
  Filled 2022-08-30: qty 3

## 2022-08-30 NOTE — Procedures (Signed)
Patient Name: Kaylee Murphy  MRN: [id number]  Epilepsy Attending: Lora Havens  Referring Physician/Provider: Elease Etienne, PA  Duration: 08/29/2022 1700 to 08/30/2022 1700   Patient history:  61 y.o. female with a history of cardiac arrest who is undergoing an EEG to evaluate for seizures.    Level of alertness: comatose   AEDs during EEG study: Versed, propofol, LEV, VPA   Technical aspects: This EEG study was done with scalp electrodes positioned according to the 10-20 International system of electrode placement. Electrical activity was reviewed with band pass filter of 1-'70Hz'$ , sensitivity of 7 uV/mm, display speed of 9m/sec with a '60Hz'$  notched filter applied as appropriate. EEG data were recorded continuously and digitally stored.  Video monitoring was available and reviewed as appropriate.   Description: EEG showed near continuous polymorphic mixed frequencies with predominantly 3-'6hz'$  theta-delta slowing admixed with 12-'15hz'$  generalized beta activity. 1-2 second bursts of generalized spikes were also noted, qasi periodic every 10-20 seconds.  As sedation was weaned and the generalized spikes became more frequent and were noted every 1 to 2 seconds. Hyperventilation and photic stimulation were not performed.      ABNORMALITY - Spike, generalized - Continuous slow, generalized    IMPRESSION: This study showed evidence of epileptogenicity with generalized onset as well as severe diffuse encephalopathy.  No seizures were seen during the study.  Romelo Sciandra OBarbra Sarks

## 2022-08-30 NOTE — Progress Notes (Signed)
[place] for Heparin Indication: chest pain/ACS  Allergies  Allergen Reactions   Cephalexin Swelling    REACTION: tongue swells and becomes disoriented   Aspirin Nausea And Vomiting   Codeine Itching   Ketorolac Tromethamine Nausea And Vomiting   Morphine And Related Other (See Comments)    headache    Patient Measurements: Height: '5\' 5"'$  (165.1 cm) Weight: 64.8 kg (142 lb 13.7 oz) IBW/kg (Calculated) : 57 Heparin Dosing Weight: 72 kg  Vital Signs: Temp: 98.1 F (36.7 C) (12/18 0630) Temp Source: Esophageal (12/18 0430) BP: 114/66 (12/18 0630) Pulse Rate: 73 (12/18 0630)  Labs: Recent Labs    08/28/22 0451 08/28/22 0855 08/28/22 1340 08/29/22 0216 08/29/22 0410 08/29/22 1333 08/30/22 0155 08/30/22 0438  HGB 9.2*  --   --  9.1*  --   --  9.2*  --   HCT 28.4*  --   --  25.9*  --   --  26.8*  --   PLT 264  --   --  237  --   --  232  --   HEPARINUNFRC  --    < >  --   --  0.12* 0.37  --  0.24*  CREATININE 0.73  --  0.76 0.61  --   --  0.55  --    < > = values in this interval not displayed.     Estimated Creatinine Clearance: 67.3 mL/min (by C-G formula based on SCr of 0.55 mg/dL).  Assessment: 61 y.o. female s/p cardiac arrest with VDRF and myoclonic status epilepticus . Pharmacy dosing heparin. Per discussion with MD, will plan continue heparin x 5 days unless bleeding occurs.  -heparin level of 0.24  below goal on 1000 units/hr -Hgb stable at 9.3 and pltc at 232K  Goal of Therapy:  Heparin level 0.3-0.7 units/ml Monitor platelets by anticoagulation protocol: Yes   Plan:  -Increase heparin to 1150 units/hr -Daily heparin level and CBC -Anticipate d/c heparin 12/19  Thank you for allowing pharmacy to participate in this patient's care.   Hildred Laser, PharmD Clinical Pharmacist **Pharmacist phone directory can now be found on [email or web address] ([email or web address]).  Listed under [place].

## 2022-08-30 NOTE — Progress Notes (Signed)
NAME:  Kaylee Murphy, MRN:  [id number], DOB:  1961-07-15, LOS: 4 ADMISSION DATE:  09/09/2022, CONSULTATION DATE:  08/30/22 REFERRING MD:  Tegeler, CHIEF COMPLAINT:  cardiac arrest   History of Present Illness:  Kaylee Murphy is a 61 y.o. 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  History taken from Epic and report as no family is available; apparently she had seizure-like activity and EMS was called.  Pt was awake, but altered on arrival and then had a syncopal episode with loss of pulses.  Initial rhythm was Vfib. She underwent approximately 50 minutes of CPR with a run of Torsades and was intubated by EMS.   In the ED, ETT changed without sedation, then patient began moving and had purposeful reaching for tube.   At the time of PCCM consult, no labs or head CT available. She initially required an epinephrine gtt, this was weaned off.  EKG without STEMI and Cardiology was also consulted.  Pertinent  Medical History   has a past medical history of Anemia, Anxiety, Chronic back pain, Chronic shoulder pain (10 years), Depression, Diabetes mellitus, Hyperlipidemia, and Hypertension.   Significant Hospital Events: Including procedures, antibiotic start and stop dates in addition to other pertinent events   12/14 witnessed out of hospital vfib arrest, 50 mins CPR 12/17 cont epileptogenicity on EEG.   Interim History / Subjective:   NAEO  This morning on 120 prop 10 versed  2 NE   Objective   Blood pressure 122/72, pulse 72, temperature 98.4 F (36.9 C), resp. rate (!) 22, height '5\' 5"'$  (1.651 m), weight 64.8 kg, last menstrual period 02/13/2014, SpO2 100 %. CVP:  [0 mmHg-10 mmHg] 0 mmHg  Vent Mode: PRVC FiO2 (%):  [40 %] 40 % Set Rate:  [22 bmp] 22 bmp Vt Set:  [440 mL] 440 mL PEEP:  [5 cmH20] 5 cmH20 Plateau  Pressure:  [16 cmH20-18 cmH20] 18 cmH20   Intake/Output Summary (Last 24 hours) at 08/30/2022 0849 Last data filed at 08/30/2022 0600 Gross per 24 hour  Intake 4410.94 ml  Output 1995 ml  Net 2415.94 ml   Filed Weights   08/28/22 0441 08/29/22 0500 08/30/22 0500  Weight: 63.5 kg 65.1 kg 64.8 kg    General - critically ill adult F intubated sedated Neuro- heavily sedated. Pupils are 3-61m and brisk.  HEENT- ETT secure. EEG in place. Anicteric sclera CV- some ectopy. Cap refill < 3 sec.  Pulm- Mechanically ventilated, symmetrical chest expansion  GI- soft nd hypoactive  GU- foley MSK- no acute joint deformity  Skin - c/d   Resolved Hospital Problem list     Assessment & Plan:    Acute hypoxic respiratory failure -pulmonary edema  -aspiration risk with OOH arrest  P -cont MV support -VAP, pulm hygiene  -course of doxy  OOH Vfib arrest -downtime was reportedly est about 50 min however preservation of other organ fxn would be less suggestive of prolonged low flow state -?sz component P -supportive care as below  SE vs myoclonus  Concern for anoxic injury  - 12/15 MRI read is non-acute however on neuro review there is concern for anoxic injury  -EEG w epileptogenicity + concern for anoxic injury (12/17)  P -wean sedation 12/18 and see if sz recurs, which would portend poor prognosis for meaningful  recovery   -cont Keppra and VPA -cont on cEEG    NSTEMI Prolonged Qtc Ectopy Acute HFrEF  Hypotension (medication, new cardiomyopathy)  -cardiac monitoring -optimize electrolytes -- will replace K 12/18 -minimize qt prolonging agents as able  -hep gtt -- no plans for cath unless there is meaningful neuro recovery  -wean NE as able   DM2  -SSI   Inadequate PO intake -EN per RDN   Elevated trigs -? Site draw proximal to prop infusion -would not change present management, defer repeat level until tomorrow    Best Practice (right click and "Reselect all  SmartList Selections" daily)   Diet/type: TF DVT prophylaxis: heparin gtt GI prophylaxis: PPI Lines: Central line Foley:  Yes, and it is still needed Code Status:  full code Last date of multidisciplinary goals of care discussion [updated at bedside 12/16   CRITICAL CARE Performed by: Cristal Generous   Total critical care time: 38 minutes  Critical care time was exclusive of separately billable procedures and treating other patients. Critical care was necessary to treat or prevent imminent or life-threatening deterioration.  Critical care was time spent personally by me on the following activities: development of treatment plan with patient and/or surrogate as well as nursing, discussions with consultants, evaluation of patient's response to treatment, examination of patient, obtaining history from patient or surrogate, ordering and performing treatments and interventions, ordering and review of laboratory studies, ordering and review of radiographic studies, pulse oximetry and re-evaluation of patient's condition.  Eliseo Gum MSN, AGACNP-BC [place] for pager 08/30/2022, 8:49 AM

## 2022-08-30 NOTE — Progress Notes (Signed)
eLink Physician-Brief Progress Note Patient Name: Kaylee Murphy DOB: 1961-02-11 MRN: [id number]   Date of Service  08/30/2022  HPI/Events of Note  Nursing reports excessive oral secretions.   eICU Interventions  Plan: Robinol 1 mg per tube now.      Intervention Category Major Interventions: Other:  Lysle Dingwall 08/30/2022, 8:50 PM

## 2022-08-31 DIAGNOSIS — J9601 Acute respiratory failure with hypoxia: Secondary | ICD-10-CM

## 2022-08-31 DIAGNOSIS — R569 Unspecified convulsions: Secondary | ICD-10-CM | POA: Diagnosis not present

## 2022-08-31 DIAGNOSIS — I4901 Ventricular fibrillation: Secondary | ICD-10-CM

## 2022-08-31 DIAGNOSIS — Z9911 Dependence on respirator [ventilator] status: Secondary | ICD-10-CM

## 2022-08-31 DIAGNOSIS — G931 Anoxic brain damage, not elsewhere classified: Secondary | ICD-10-CM

## 2022-08-31 DIAGNOSIS — I469 Cardiac arrest, cause unspecified: Secondary | ICD-10-CM | POA: Diagnosis not present

## 2022-08-31 DIAGNOSIS — R4182 Altered mental status, unspecified: Secondary | ICD-10-CM | POA: Diagnosis not present

## 2022-08-31 DIAGNOSIS — Z7189 Other specified counseling: Secondary | ICD-10-CM

## 2022-08-31 LAB — CULTURE, BLOOD (ROUTINE X 2)
Culture: NO GROWTH
Culture: NO GROWTH
Special Requests: ADEQUATE
Special Requests: ADEQUATE

## 2022-08-31 LAB — GLUCOSE, CAPILLARY
Glucose-Capillary: 140 mg/dL — ABNORMAL HIGH (ref 70–99)
Glucose-Capillary: 145 mg/dL — ABNORMAL HIGH (ref 70–99)
Glucose-Capillary: 201 mg/dL — ABNORMAL HIGH (ref 70–99)
Glucose-Capillary: 209 mg/dL — ABNORMAL HIGH (ref 70–99)
Glucose-Capillary: 212 mg/dL — ABNORMAL HIGH (ref 70–99)
Glucose-Capillary: 227 mg/dL — ABNORMAL HIGH (ref 70–99)
Glucose-Capillary: 41 mg/dL — CL (ref 70–99)

## 2022-08-31 LAB — CBC
HCT: 29.3 % — ABNORMAL LOW (ref 36.0–46.0)
Hemoglobin: 9.6 g/dL — ABNORMAL LOW (ref 12.0–15.0)
MCH: 27 pg (ref 26.0–34.0)
MCHC: 32.8 g/dL (ref 30.0–36.0)
MCV: 82.5 fL (ref 80.0–100.0)
Platelets: 250 10*3/uL (ref 150–400)
RBC: 3.55 MIL/uL — ABNORMAL LOW (ref 3.87–5.11)
RDW: 15.4 % (ref 11.5–15.5)
WBC: 19 10*3/uL — ABNORMAL HIGH (ref 4.0–10.5)
nRBC: 0 % (ref 0.0–0.2)

## 2022-08-31 LAB — BASIC METABOLIC PANEL
Anion gap: 9 (ref 5–15)
BUN: 7 mg/dL (ref 6–20)
CO2: 21 mmol/L — ABNORMAL LOW (ref 22–32)
Calcium: 7 mg/dL — ABNORMAL LOW (ref 8.9–10.3)
Chloride: 110 mmol/L (ref 98–111)
Creatinine, Ser: 0.55 mg/dL (ref 0.44–1.00)
GFR, Estimated: >60 mL/min (ref >60)
Glucose, Bld: 256 mg/dL — ABNORMAL HIGH (ref 70–99)
Potassium: 2.8 mmol/L — ABNORMAL LOW (ref 3.5–5.1)
Sodium: 140 mmol/L (ref 135–145)

## 2022-08-31 LAB — MAGNESIUM: Magnesium: 1.4 mg/dL — ABNORMAL LOW (ref 1.7–2.4)

## 2022-08-31 LAB — TRIGLYCERIDES: Triglycerides: 113 mg/dL (ref <150)

## 2022-08-31 LAB — HEPARIN LEVEL (UNFRACTIONATED): Heparin Unfractionated: 0.54 IU/mL (ref 0.30–0.70)

## 2022-08-31 MED ORDER — ENOXAPARIN SODIUM 40 MG/0.4ML IJ SOSY
40.0000 mg | PREFILLED_SYRINGE | INTRAMUSCULAR | Status: DC
  Administered 2022-08-31 – 2022-09-06 (×7): 40 mg via SUBCUTANEOUS
  Filled 2022-08-31 (×7): qty 0.4

## 2022-08-31 MED ORDER — CALCIUM GLUCONATE-NACL 1-0.675 GM/50ML-% IV SOLN
1.0000 g | Freq: Once | INTRAVENOUS | Status: AC
  Administered 2022-08-31: 1000 mg via INTRAVENOUS
  Filled 2022-08-31: qty 50

## 2022-08-31 MED ORDER — INSULIN GLARGINE-YFGN 100 UNIT/ML ~~LOC~~ SOLN
10.0000 [IU] | Freq: Every day | SUBCUTANEOUS | Status: DC
  Administered 2022-08-31 – 2022-09-02 (×3): 10 [IU] via SUBCUTANEOUS
  Filled 2022-08-31 (×4): qty 0.1

## 2022-08-31 MED ORDER — MAGNESIUM SULFATE 4 GM/100ML IV SOLN
4.0000 g | Freq: Once | INTRAVENOUS | Status: AC
  Administered 2022-08-31: 4 g via INTRAVENOUS
  Filled 2022-08-31: qty 100

## 2022-08-31 MED ORDER — POTASSIUM CHLORIDE 20 MEQ PO PACK
20.0000 meq | PACK | ORAL | Status: AC
  Administered 2022-08-31 (×2): 20 meq
  Filled 2022-08-31 (×2): qty 1

## 2022-08-31 MED ORDER — POTASSIUM CHLORIDE 10 MEQ/50ML IV SOLN
10.0000 meq | INTRAVENOUS | Status: AC
  Administered 2022-08-31 (×4): 10 meq via INTRAVENOUS
  Filled 2022-08-31 (×4): qty 50

## 2022-08-31 NOTE — Procedures (Signed)
Patient Name: Kaylee Murphy  MRN: [id number]  Epilepsy Attending: Lora Havens  Referring Physician/Provider: Elease Etienne, PA  Duration: 08/30/2022 1700 to 08/31/2022 1700   Patient history:  61 y.o. female with a history of cardiac arrest who is undergoing an EEG to evaluate for seizures.    Level of alertness: comatose   AEDs during EEG study: propofol, LEV, VPA   Technical aspects: This EEG study was done with scalp electrodes positioned according to the 10-20 International system of electrode placement. Electrical activity was reviewed with band pass filter of 1-'70Hz'$ , sensitivity of 7 uV/mm, display speed of 47m/sec with a '60Hz'$  notched filter applied as appropriate. EEG data were recorded continuously and digitally stored.  Video monitoring was available and reviewed as appropriate.   Description: At the beginning of the study, EEG showed near continuous generalized background attenuation admixed with generalized spikes at 1 to 2 seconds. Gradually, the frequency of spikes worsened and were noted at 2 -'3hz'$ .  Sedation was resumed and gradually after around 430 on 08/31/2022, EEG showed burst suppression pattern with bursts of sharply contoured 5 to 6 Hz theta slowing admixed with spikes every 3 to 8 seconds alternating with EEG suppression.  Hyperventilation and photic stimulation were not performed.      Patient event button was pressed on 08/30/2022 at 2032, 2308 and 2334 during which patient had head twitching. Concomitant EEG showed generalized spikes at 2.5 to 3 Hz consistent with myoclonic seizure.  ABNORMALITY - Myoclonic seizure, generalized - Periodic spike Spike, generalized - Burst suppression, generalized   IMPRESSION: At the beginning of the study, EEG showed evidence of epileptogenicity with generalized onset and increased risk of seizure recurrence as well as profound diffuse encephalopathy.  As sedation was weaned, EEG worsened with increased frequency of  epileptiform discharges.  Event button was pressed on 08/30/2022 at 2032, 2308 and 2334 during which patient had head twitching. These episodes were consistent with myoclonic seizure.    Sedation was resumed and gradually after around 430 on 08/21/2022, the frequency of epileptiform discharges improved.  However, EEG was still suggestive of epileptogenicity with generalized onset as well as profound diffuse encephalopathy.  In the setting of cardiac arrest, this EEG is concerning for anoxic/hypoxic brain injury.  [place] OBarbra Sarks

## 2022-08-31 NOTE — Progress Notes (Signed)
LTM maint complete - no skin breakdown under:Fp1 Fp2 Serviced T8 [facility] monitored, Event button test confirmed by [facility].

## 2022-08-31 NOTE — Progress Notes (Signed)
NAME:  Kaylee Murphy, MRN:  [id number], DOB:  1961-06-22, LOS: 5 ADMISSION DATE:  08/31/2022, CONSULTATION DATE:  08/31/22 REFERRING MD:  Tegeler, CHIEF COMPLAINT:  cardiac arrest   History of Present Illness:  Kaylee Murphy is a 61 y.o. 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  History taken from Epic and report as no family is available; apparently she had seizure-like activity and EMS was called.  Pt was awake, but altered on arrival and then had a syncopal episode with loss of pulses.  Initial rhythm was Vfib. She underwent approximately 50 minutes of CPR with a run of Torsades and was intubated by EMS.   In the ED, ETT changed without sedation, then patient began moving and had purposeful reaching for tube.   At the time of PCCM consult, no labs or head CT available. She initially required an epinephrine gtt, this was weaned off.  EKG without STEMI and Cardiology was also consulted.  Pertinent  Medical History   has a past medical history of Anemia, Anxiety, Chronic back pain, Chronic shoulder pain (10 years), Depression, Diabetes mellitus, Hyperlipidemia, and Hypertension.   Significant Hospital Events: Including procedures, antibiotic start and stop dates in addition to other pertinent events   12/14 witnessed out of hospital vfib arrest, 50 mins CPR 12/17 cont epileptogenicity on EEG.  12/18 weaned off sedation 12/19 night had another sz, put back on versed prop   Interim History / Subjective:  Seized overnight off of sedation Spiked a temp to 102.4 and white count up to 19 after sz   Now on 40 prop 4 versed in wake of seizure recurrence, and with incr AEDs   Objective   Blood pressure (!) 131/55, pulse 74, temperature 99.5 F (37.5 C), resp. rate (!) 22, height '5\' 5"'$  (1.651 m), weight 65 kg, last  menstrual period 02/13/2014, SpO2 99 %. CVP:  [1 mmHg-9 mmHg] 2 mmHg  Vent Mode: PRVC FiO2 (%):  [40 %-60 %] 40 % Set Rate:  [22 bmp] 22 bmp Vt Set:  [440 mL] 440 mL PEEP:  [5 cmH20] 5 cmH20 Plateau Pressure:  [17 cmH20-23 cmH20] 19 cmH20   Intake/Output Summary (Last 24 hours) at 08/31/2022 1012 Last data filed at 08/31/2022 0910 Gross per 24 hour  Intake 3564.93 ml  Output 3470 ml  Net 94.93 ml   Filed Weights   08/29/22 0500 08/30/22 0500 08/31/22 0500  Weight: 65.1 kg 64.8 kg 65 kg    General - Critically ill adult F intubated sedated  Neuro- Sedated Pupils are 4 and brisk   HEENT- ETT secure. EEG in place. Anicteric sclera CV- Ectopy on monitor.  Pulm- mechanically ventilated. Symmetrical chest expansion  GI- soft ndnt  GU- foley  MSK- no acute joint deformity. No pitting edema  Skin - c/d/w   Resolved Hospital Problem list     Assessment & Plan:   OOH Vfib arrest  -downtime was reportedly est about 50 min however preservation of other organ fxn would be less suggestive of prolonged low flow state -?sz component P -supportive care as below  Acute hypoxic respiratory failure  -pulmonary edema  -aspiration risk with OOH arrest  P -cont MV support -VAP, pulm hygiene  -course of doxy  SE vs myoclonus  Concern for anoxic brain injury  - 12/15  MRI read is non-acute however on neuro review there is concern for anoxic injury  -off sedation 12/18-19 night shift, seized again.  P -with seizure recurrence, prognosis for meaningful recovery is poor -need to discuss GOC with family -cont Keppra and VPA -cEEG   NSTEMI Prolonged qtc Ectopy Acute HFrEF Hypotension (meds, cardiomyopathy) -cardiac monitoring -optimize electrolytes -- will replace K 12/18 -minimize qt prolonging agents as able  -medical mgmnt with hep gtt. no plans for cath unless there is meaningful neuro recovery which is unlikely with recurrence of sz off sedation  -wean NE as able    Hypokalemia  Hypomagnesemia -replace -trend  Fever Leukocytosis -?related to seizure vs infectious -- favor sz related, is on CAP coverage and temp uptrend coincides w sedation wean, has improved after seizure with increased sedation + some APAP  -cont to trend. Possible escalation of coverage pending GOC conversaions   DM2  -SSI   Inadequate PO intake  -EN per RDN   Elevated trigs -? Site draw proximal to prop infusion. Follow per protocol. Wouldn't change present management to recheck as most pressing need is GOC and prop is aiding in sz management   Best Practice (right click and "Reselect all SmartList Selections" daily)   Diet/type: TF DVT prophylaxis: heparin gtt GI prophylaxis: PPI Lines: Central line Foley:  Yes, and it is still needed Code Status:  full code Last date of multidisciplinary goals of care discussion [updated on phone 12/18   CRITICAL CARE Performed by: Cristal Generous   Total critical care time: 38 minutes  Critical care time was exclusive of separately billable procedures and treating other patients. Critical care was necessary to treat or prevent imminent or life-threatening deterioration.  Critical care was time spent personally by me on the following activities: development of treatment plan with patient and/or surrogate as well as nursing, discussions with consultants, evaluation of patient's response to treatment, examination of patient, obtaining history from patient or surrogate, ordering and performing treatments and interventions, ordering and review of laboratory studies, ordering and review of radiographic studies, pulse oximetry and re-evaluation of patient's condition.  Eliseo Gum MSN, AGACNP-BC [name] for pager 08/31/2022, 10:12 AM

## 2022-08-31 NOTE — Progress Notes (Signed)
Patient began having subtle jerking activity overnight.  EEG shows generalized periodic epileptiform discharges.  Initially these are at a frequency of 2 Hz which is less concerning but given that she is now having clinical activity associated with a frequency of deep head in the 3 Hz range, I do think there is concern that this is recurrent seizure.  Advised to increase her propofol and optimize her Keppra and Depakote.  I strongly suspect a dismal prognosis, especially with return of this activity.  If aggressive care is continued to be desired by the family, neurology can continue to follow for sedative and antiepileptic management.  Roland Rack, MD [facility] [phone]  If 7pm- 7am, please page neurology on call as listed in [place].

## 2022-08-31 NOTE — Progress Notes (Signed)
[facility] ADULT ICU REPLACEMENT PROTOCOL   The patient does apply for the [facility] Adult ICU Electrolyte Replacment Protocol based on the criteria listed below:   1.Exclusion criteria: TCTS, ECMO, Dialysis, and Myasthenia Gravis patients 2. Is GFR >/= 30 ml/min? Yes.    Patient's GFR today is >60 3. Is SCr </= 2? Yes.   Patient's SCr is 0.55 mg/dL 4. Did SCr increase >/= 0.5 in 24 hours? No. 5.Pt's weight >40kg  Yes.   6. Abnormal electrolyte(s): K 2.8 , Mag 1.4 7. Electrolytes replaced per protocol 8.  Call MD STAT for K+ </= 2.5, Phos </= 1, or Mag </= 1 Physician:    Ronda Fairly A 08/31/2022 7:01 AM

## 2022-08-31 NOTE — Progress Notes (Signed)
Tried to call son Kaylee Murphy to discuss seizure recurrence with sedation wean.  There was no answer and no voicemail set up.  Will try again later, and have asked care team to let PCCM know if he arrives at the bedside today so we can discuss this development & GOC.    Eliseo Gum MSN, AGACNP-BC [facility] 08/31/2022, 1:08 PM

## 2022-08-31 NOTE — IPAL (Signed)
  Interdisciplinary Goals of Care Family Meeting   Date carried out: 08/31/2022  Location of the meeting: Bedside  Member's involved: Nurse Practitioner, Bedside Registered Nurse, and Family Member or next of kin  Durable Power of Attorney or acting medical decision maker: Kaylee Murphy, son    Discussion: We discussed goals of care for [name] .  Discussed recurrence of seizure with sedation wean last night, and poor overall prognosis for meaningful recovery.  Plan to continue current care for 2 more days, and transition to comfort at that time unless there has been miraculous medial improvement.With recurrence of sz, will remain on prop and versed.  DNR if arrests before that time. Continue current care, no escalation.    Code status:   Code Status: DNR   Disposition: Continue current acute care  Time spent for the meeting: 20 min    Kaylee Generous, NP  08/31/2022, 5:37 PM

## 2022-09-01 ENCOUNTER — Inpatient Hospital Stay (HOSPITAL_COMMUNITY): Payer: Medicare Other

## 2022-09-01 DIAGNOSIS — Z515 Encounter for palliative care: Secondary | ICD-10-CM

## 2022-09-01 DIAGNOSIS — Z66 Do not resuscitate: Secondary | ICD-10-CM

## 2022-09-01 DIAGNOSIS — I469 Cardiac arrest, cause unspecified: Secondary | ICD-10-CM | POA: Diagnosis not present

## 2022-09-01 DIAGNOSIS — G40409 Other generalized epilepsy and epileptic syndromes, not intractable, without status epilepticus: Secondary | ICD-10-CM

## 2022-09-01 DIAGNOSIS — J9601 Acute respiratory failure with hypoxia: Secondary | ICD-10-CM | POA: Diagnosis not present

## 2022-09-01 DIAGNOSIS — Z9911 Dependence on respirator [ventilator] status: Secondary | ICD-10-CM | POA: Diagnosis not present

## 2022-09-01 DIAGNOSIS — R4182 Altered mental status, unspecified: Secondary | ICD-10-CM | POA: Diagnosis not present

## 2022-09-01 DIAGNOSIS — R569 Unspecified convulsions: Secondary | ICD-10-CM | POA: Diagnosis not present

## 2022-09-01 LAB — CBC
HCT: 29 % — ABNORMAL LOW (ref 36.0–46.0)
Hemoglobin: 9.6 g/dL — ABNORMAL LOW (ref 12.0–15.0)
MCH: 27 pg (ref 26.0–34.0)
MCHC: 33.1 g/dL (ref 30.0–36.0)
MCV: 81.5 fL (ref 80.0–100.0)
Platelets: 272 10*3/uL (ref 150–400)
RBC: 3.56 MIL/uL — ABNORMAL LOW (ref 3.87–5.11)
RDW: 15.5 % (ref 11.5–15.5)
WBC: 18.1 10*3/uL — ABNORMAL HIGH (ref 4.0–10.5)
nRBC: 0 % (ref 0.0–0.2)

## 2022-09-01 LAB — BASIC METABOLIC PANEL
Anion gap: 11 (ref 5–15)
BUN: 11 mg/dL (ref 6–20)
CO2: 24 mmol/L (ref 22–32)
Calcium: 7.9 mg/dL — ABNORMAL LOW (ref 8.9–10.3)
Chloride: 107 mmol/L (ref 98–111)
Creatinine, Ser: 0.48 mg/dL (ref 0.44–1.00)
GFR, Estimated: >60 mL/min (ref >60)
Glucose, Bld: 245 mg/dL — ABNORMAL HIGH (ref 70–99)
Potassium: 3.8 mmol/L (ref 3.5–5.1)
Sodium: 142 mmol/L (ref 135–145)

## 2022-09-01 LAB — GLUCOSE, CAPILLARY
Glucose-Capillary: 263 mg/dL — ABNORMAL HIGH (ref 70–99)
Glucose-Capillary: 145 mg/dL — ABNORMAL HIGH (ref 70–99)
Glucose-Capillary: 183 mg/dL — ABNORMAL HIGH (ref 70–99)
Glucose-Capillary: 192 mg/dL — ABNORMAL HIGH (ref 70–99)
Glucose-Capillary: 172 mg/dL — ABNORMAL HIGH (ref 70–99)
Glucose-Capillary: 193 mg/dL — ABNORMAL HIGH (ref 70–99)

## 2022-09-01 LAB — TRIGLYCERIDES: Triglycerides: 374 mg/dL — ABNORMAL HIGH (ref <150)

## 2022-09-01 MED ORDER — SENNOSIDES 8.8 MG/5ML PO SYRP
10.0000 mL | ORAL_SOLUTION | Freq: Two times a day (BID) | ORAL | Status: DC
  Administered 2022-09-04 – 2022-09-07 (×6): 10 mL
  Filled 2022-09-01 (×9): qty 10

## 2022-09-01 MED ORDER — GLYCOPYRROLATE 0.2 MG/ML IJ SOLN
0.1000 mg | Freq: Three times a day (TID) | INTRAMUSCULAR | Status: DC | PRN
  Administered 2022-09-03: 0.1 mg via INTRAVENOUS
  Filled 2022-09-01: qty 1

## 2022-09-01 MED ORDER — DOCUSATE SODIUM 50 MG/5ML PO LIQD
50.0000 mg | Freq: Two times a day (BID) | ORAL | Status: DC
  Administered 2022-09-04 – 2022-09-07 (×6): 50 mg
  Filled 2022-09-01 (×9): qty 10

## 2022-09-01 MED ORDER — POTASSIUM CHLORIDE 20 MEQ PO PACK
40.0000 meq | PACK | Freq: Once | ORAL | Status: AC
  Administered 2022-09-01: 40 meq
  Filled 2022-09-01: qty 2

## 2022-09-01 NOTE — Progress Notes (Signed)
   Palliative Medicine Inpatient Follow Up Note  The Palliative Care team has acknowledged the consultation for Lateria Limb.   The critical care team - Eliseo Gum had a goals of care meeting with family on 12/19 - they had elected to allow two more days to see if progress can be made and if not a transition to comfort care.  Per secure chat CCM at this time do not feel our involvement is needed as goals are clear.  We will remove the consult and remain available as needed.  No Charge ______________________________________________________________________________________ [place] Team Team Cell Phone: [phone] Please utilize secure chat with additional questions, if there is no response within 30 minutes please call the above phone number  Palliative Medicine Team providers are available by phone from 7am to 7pm daily and can be reached through the team cell phone.  Should this patient require assistance outside of these hours, please call the patient's attending physician.

## 2022-09-01 NOTE — Progress Notes (Signed)
NAME:  Kaylee Murphy, MRN:  [id number], DOB:  [birth year], LOS: 6 ADMISSION DATE:  08/19/2022, CONSULTATION DATE:  09/01/22 REFERRING MD:  Tegeler, CHIEF COMPLAINT:  cardiac arrest   History of Present Illness:  Kaylee Murphy is a 61 y.o. 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  History taken from Epic and report as no family is available; apparently she had seizure-like activity and EMS was called.  Pt was awake, but altered on arrival and then had a syncopal episode with loss of pulses.  Initial rhythm was Vfib. She underwent approximately 50 minutes of CPR with a run of Torsades and was intubated by EMS.   In the ED, ETT changed without sedation, then patient began moving and had purposeful reaching for tube.   At the time of PCCM consult, no labs or head CT available. She initially required an epinephrine gtt, this was weaned off.  EKG without STEMI and Cardiology was also consulted.  Pertinent  Medical History   has a past medical history of Anemia, Anxiety, Chronic back pain, Chronic shoulder pain (10 years), Depression, Diabetes mellitus, Hyperlipidemia, and Hypertension.   Significant Hospital Events: Including procedures, antibiotic start and stop dates in addition to other pertinent events   12/14 witnessed out of hospital vfib arrest, 50 mins CPR 12/17 cont epileptogenicity on EEG.  12/18 weaned off sedation 12/19 night had another sz, put back on versed prop. DNR after family mtg, with plan for comfort in 2d if not significantly improved    Interim History / Subjective:  Labs stable Still w leukocytosis, slightly improved from yesterday   Temp variable  Small BM this morning   ETT was in a different position, CXR acquired to verify placement. Personally reviewd, about 2cm above carina   Objective    Blood pressure (!) 162/86, pulse 76, temperature 99.3 F (37.4 C), resp. rate (!) 26, height '5\' 5"'$  (1.651 m), weight 70.9 kg, last menstrual period 02/13/2014, SpO2 99 %. CVP:  [3 mmHg-18 mmHg] 3 mmHg  Vent Mode: PRVC FiO2 (%):  [40 %] 40 % Set Rate:  [22 bmp] 22 bmp Vt Set:  [440 mL] 440 mL PEEP:  [5 cmH20] 5 cmH20 Plateau Pressure:  [16 cmH20-21 cmH20] 21 cmH20   Intake/Output Summary (Last 24 hours) at 09/01/2022 1157 Last data filed at 09/01/2022 1100 Gross per 24 hour  Intake 3153.41 ml  Output 2790 ml  Net 363.41 ml   Filed Weights   08/30/22 0500 08/31/22 0500 09/01/22 0359  Weight: 64.8 kg 65 kg 70.9 kg    General - critically ill older adult F intubated sedated  Neuro- Pupils are 7m, reactive. Spontaneous yawning. Initiates respirations. No response to pain  HEENT- NCAT ETT secure EEG in place  CV- Ectopy on monitor. Cap refill < 3 sec  Pulm- Mechanically ventilated, diminished basilar sounds GI- soft ndnt  GU- foley  MSK- no acute joint deformity, trace edema  Skin - c/d/w   Resolved Hospital Problem list     Assessment & Plan:   OOH Vfib arrest Acute hypoxic respiratory failure Myoclonic seizures Anoxic brain injury  NSTEMI Ectopy, prolonged qtc  Acute HFrEF Hypotension  Fever, suspect central Leukocytosis, suspect reactive in setting of sz  DM2 Inadequate PO intake Elevated triglycerides on propofol  Goals of care DNR status -  with seizure recurrence during sedation holiday 12/18-19 night, prognosis for meaningful recovery is poor P -DNR status -see IPAL 12/19 note -- family would like 2 days of continued tx, if prognosis felt to be the same then compassionate transition to comfort care (would be 12/21 night or possibly 12/22 day)  -cont MV support -NE as needed for MAP > 65  -hep gtt, cardiac monitoring, lyte optimization  -no ischemic work up given neuro prognosis  -cont Keppra, VPA -on cEEG. Will keep this so that we can update family  12/21 if there has been change.  -keep on prop, versed gtt -- uptitrate as needed if evidence of sz.  -prop and versed are needed for sz suppression, and should be continued at the time of compassionate extubation in a couple days  -given neuro prognosis, do not think we should come off of prop with elevated trigs as this is serving for seizure suppression    Best Practice (right click and "Reselect all SmartList Selections" daily)   Diet/type: TF DVT prophylaxis: heparin gtt GI prophylaxis: PPI Lines: Central line Foley:  Yes, and it is still needed Code Status:  full code Last date of multidisciplinary goals of care discussion [updated on phone 12/18   CRITICAL CARE Performed by: Cristal Generous   Total critical care time: 38 minutes  Critical care time was exclusive of separately billable procedures and treating other patients. Critical care was necessary to treat or prevent imminent or life-threatening deterioration.  Critical care was time spent personally by me on the following activities: development of treatment plan with patient and/or surrogate as well as nursing, discussions with consultants, evaluation of patient's response to treatment, examination of patient, obtaining history from patient or surrogate, ordering and performing treatments and interventions, ordering and review of laboratory studies, ordering and review of radiographic studies, pulse oximetry and re-evaluation of patient's condition.  Eliseo Gum MSN, AGACNP-BC [place] for pager 09/01/2022, 11:57 AM

## 2022-09-01 NOTE — Procedures (Signed)
Patient Name: Kaylee Murphy  MRN: [id number]  Epilepsy Attending: Lora Havens  Referring Physician/Provider: Elease Etienne, PA  Duration: 08/31/2022 1700 to 09/01/2022 1700   Patient history:  61 y.o. female with a history of cardiac arrest who is undergoing an EEG to evaluate for seizures.    Level of alertness: comatose   AEDs during EEG study: propofol, versed, LEV, VPA   Technical aspects: This EEG study was done with scalp electrodes positioned according to the 10-20 International system of electrode placement. Electrical activity was reviewed with band pass filter of 1-'70Hz'$ , sensitivity of 7 uV/mm, display speed of 81m/sec with a '60Hz'$  notched filter applied as appropriate. EEG data were recorded continuously and digitally stored.  Video monitoring was available and reviewed as appropriate.   Description: EEG showed no generalized background suppression admixed with generalized spikes with fluctuating frequency of 0.5 to 4 Hz. Hyperventilation and photic stimulation were not performed.      ABNORMALITY - Periodic spike, generalized - Background suppression, generalized   IMPRESSION: EEG showed evidence of epileptogenicity with generalized onset and increased risk of seizure recurrence as well as profound diffuse encephalopathy.  In the setting of cardiac arrest, this EEG is concerning for anoxic/hypoxic brain injury.   Jaquon Gingerich OBarbra Sarks

## 2022-09-01 NOTE — Progress Notes (Signed)
LTM maint complete - no skin breakdown; skin irritation Fp2 Serviced P3 P8 Pz T8 Fp2 F4 Cz C3 ground. [facility] monitored, Event button test confirmed by [facility]. :

## 2022-09-02 DIAGNOSIS — I469 Cardiac arrest, cause unspecified: Secondary | ICD-10-CM | POA: Diagnosis not present

## 2022-09-02 DIAGNOSIS — R4182 Altered mental status, unspecified: Secondary | ICD-10-CM | POA: Diagnosis not present

## 2022-09-02 DIAGNOSIS — Z9911 Dependence on respirator [ventilator] status: Secondary | ICD-10-CM | POA: Diagnosis not present

## 2022-09-02 DIAGNOSIS — G40409 Other generalized epilepsy and epileptic syndromes, not intractable, without status epilepticus: Secondary | ICD-10-CM | POA: Diagnosis not present

## 2022-09-02 DIAGNOSIS — G931 Anoxic brain damage, not elsewhere classified: Secondary | ICD-10-CM | POA: Diagnosis not present

## 2022-09-02 LAB — BASIC METABOLIC PANEL
Anion gap: 9 (ref 5–15)
BUN: 16 mg/dL (ref 6–20)
CO2: 24 mmol/L (ref 22–32)
Calcium: 8.3 mg/dL — ABNORMAL LOW (ref 8.9–10.3)
Chloride: 108 mmol/L (ref 98–111)
Creatinine, Ser: 0.53 mg/dL (ref 0.44–1.00)
GFR, Estimated: >60 mL/min (ref >60)
Glucose, Bld: 206 mg/dL — ABNORMAL HIGH (ref 70–99)
Potassium: 4.1 mmol/L (ref 3.5–5.1)
Sodium: 141 mmol/L (ref 135–145)

## 2022-09-02 LAB — GLUCOSE, CAPILLARY
Glucose-Capillary: 215 mg/dL — ABNORMAL HIGH (ref 70–99)
Glucose-Capillary: 181 mg/dL — ABNORMAL HIGH (ref 70–99)
Glucose-Capillary: 194 mg/dL — ABNORMAL HIGH (ref 70–99)
Glucose-Capillary: 221 mg/dL — ABNORMAL HIGH (ref 70–99)
Glucose-Capillary: 216 mg/dL — ABNORMAL HIGH (ref 70–99)

## 2022-09-02 LAB — TRIGLYCERIDES: Triglycerides: 103 mg/dL (ref <150)

## 2022-09-02 NOTE — Progress Notes (Signed)
Nutrition Follow-up  DOCUMENTATION CODES:   Not applicable  INTERVENTION:  Continue tube feeding via OG: -Vital AF 1.2 at 60 mL/hour -Provides: 1728 kcal, 108 grams of protein, 1166 mL H2O daily  Continue minimum free water flush of 30 mL every 4 hours for tube patency.  Additional kcals (lipid) via Propofol infusion.  NUTRITION DIAGNOSIS:   Inadequate oral intake related to acute illness as evidenced by NPO status.  Ongoing.  GOAL:   Patient will meet greater than or equal to 90% of their needs  Met with tube feeds.  MONITOR:   TF tolerance, Labs, Vent status, Weight trends, Skin  REASON FOR ASSESSMENT:   Consult, Ventilator Enteral/tube feeding initiation and management  ASSESSMENT:   61 yo female admitted with witnessed OOH Vfib arrest, prolonged Qtc, possible torsades. Pt required intubation, possible sizures (no hx). PMH includes HTN, CVA, DM, HLD, tobacco use  12/14: witnessed out of hospital vfib arrest, 50 minutes CPR 12/19: family meeting with plan for comfort in 2 days if not significantly improved 12/21: EEG concerning for anoxic/hypoxic brain injury  Family is discussing goals of care with Palliative Medicine and team. Plan for possible transition to comfort care.  No family present at time of RD assessment. Pt is tolerating tube feed regimen well. Discussed with RN. Plan for family to arrive today to discuss goals of care.  Suspect admission wt of 75 kg was not true measured wt. Pt was 64.2 kg on 08/27/22. Current wt is 67.8 kg. Possibly falsely elevated from fluid. Will continue to monitor.  Patient is currently intubated on ventilator support MV: 13.7 L/min Temp (24hrs), Avg:98.6 F (37 C), Min:97.2 F (36.2 C), Max:100 F (37.8 C)  Propofol: 15.4 ml/hr (407 kcal daily)  Medications reviewed and include: Colace, Novolog 0-15 units Q4hrs, Semglee 10 units daily, pantoprazole, sennosides, doxycycline, Keppra, Versedgtt, norepinephrine gtt at 2  mcg/min, propofol gtt, valproate  Labs reviewed: CBG 181-215  Enteral Access: 16 Fr. OGT placed 12/14; terminates in stomach per abdominal x-ray 12/14  Tube feeds: Vital AF 1.2 at 60 mL/hour Provides: 1728 kcal, 108 grams of protein, 1166 mL water Free water: 30 mL every 4 hours for tube patency  UOP: 2475 mL (1.5 mL/kg/hr)  I/O: +9301.4 mL since admission  NUTRITION - FOCUSED PHYSICAL EXAM:  Flowsheet Row Most Recent Value  Orbital Region No depletion  Upper Arm Region No depletion  Thoracic and Lumbar Region No depletion  Buccal Region Unable to assess  Temple Region Moderate depletion  Clavicle Bone Region No depletion  Clavicle and Acromion Bone Region No depletion  Scapular Bone Region Unable to assess  Patellar Region No depletion  Anterior Thigh Region No depletion  Posterior Calf Region Unable to assess  Edema (RD Assessment) Mild  Hair Reviewed  Eyes Unable to assess  Mouth Unable to assess  Skin Reviewed  Nails Reviewed      Diet Order:   Diet Order     None      EDUCATION NEEDS:   Education needs have been addressed  Skin:  Skin Assessment: Reviewed RN Assessment  Last BM:  12/21 - medium type 4  Height:   Ht Readings from Last 1 Encounters:  08/24/2022 _0  (1.651 m)   Weight:   Wt Readings from Last 1 Encounters:  09/02/22 67.8 kg   BMI:  Body mass index is 24.87 kg/m.  Estimated Nutritional Needs:   Kcal:  1700-1900 kcals  Protein:  90-115 g  Fluid:  >/= 1.7 L  Loanne Drilling, MS, RD, LDN, CNSC Pager number available on Amion

## 2022-09-02 NOTE — IPAL (Signed)
  Interdisciplinary Goals of Care Family Meeting   Date carried out: 09/02/2022  Location of the meeting: Bedside  Member's involved: Nurse Practitioner, Bedside Registered Nurse, Family Member or next of kin, and Other: other friend's of patient but are considered family.  Durable Power of Tour manager: son, Kaylee Murphy    Discussion: We discussed goals of care for Kaylee Murphy .Met back with family as scheduled per last discussion on 12/19.  Kaylee Murphy surprised no other family, specifically states his 2 other brothers, has been to visit over the last 2 days. Also, states he was not aware of her initial cardiac arrest nor how long she was down.  Seems very overwhelmed at making any decisions and for now asking for more time.  Not even able to discuss what a compassionate extubation would look like.  Did advocate for not prolonging decision given her very poor prognosis, requiring multiple meds to suppress seizures.  Plans to meet again 12/22.      [place] aware of situation.    Code status:   Code Status: DNR   Disposition: Continue current acute care  Time spent for the meeting: 35 mins       Kennieth Rad, [facility] 09/02/2022, 5:54 PM  See Amion for pager If no response to pager, please call PCCM consult pager After 7:00 pm call Elink

## 2022-09-02 NOTE — Progress Notes (Signed)
Pt currently coughs with ETT suction. SBT performed, pt failed quickly due to an increase in RR to 40s and an increase in WOB. RN at bedside during trial.

## 2022-09-02 NOTE — Progress Notes (Signed)
LTM maint complete - no skin breakdown [facility] monitored, Event button test confirmed by [facility]. ? ?

## 2022-09-02 NOTE — Procedures (Addendum)
Patient Name: Kaylee Murphy  MRN: [id number]  Epilepsy Attending: Lora Havens  Referring Physician/Provider: Elease Etienne, PA  Duration: 09/01/2022 1700 to 09/02/2022 1342   Patient history:  62 y.o. female with a history of cardiac arrest who is undergoing an EEG to evaluate for seizures.    Level of alertness: comatose   AEDs during EEG study: propofol, versed, LEV, VPA   Technical aspects: This EEG study was done with scalp electrodes positioned according to the 10-20 International system of electrode placement. Electrical activity was reviewed with band pass filter of 1-'70Hz'$ , sensitivity of 7 uV/mm, display speed of 57m/sec with a '60Hz'$  notched filter applied as appropriate. EEG data were recorded continuously and digitally stored.  Video monitoring was available and reviewed as appropriate.   Description: EEG showed no generalized background suppression admixed with generalized spikes with fluctuating frequency of 0.5 to 4 Hz. Hyperventilation and photic stimulation were not performed.      ABNORMALITY - Periodic spike, generalized - Background suppression, generalized   IMPRESSION: EEG showed evidence of epileptogenicity with generalized onset and increased risk of seizure recurrence as well as profound diffuse encephalopathy.  In the setting of cardiac arrest, this EEG is concerning for anoxic/hypoxic brain injury.   Kyiah Canepa OBarbra Sarks

## 2022-09-02 NOTE — Progress Notes (Signed)
EEG D/C'd. [facility] was notified. No skin breakdown noted.

## 2022-09-02 NOTE — Progress Notes (Signed)
NAME:  Kaylee Murphy, MRN:  [id number], DOB:  [birth year], LOS: 7 ADMISSION DATE:  08/21/2022, CONSULTATION DATE:  09/02/22 REFERRING MD:  Tegeler, CHIEF COMPLAINT:  cardiac arrest   History of Present Illness:  Kaylee Murphy is a 61 y.o. 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  History taken from Epic and report as no family is available; apparently she had seizure-like activity and EMS was called.  Pt was awake, but altered on arrival and then had a syncopal episode with loss of pulses.  Initial rhythm was Vfib. She underwent approximately 50 minutes of CPR with a run of Torsades and was intubated by EMS.   In the ED, ETT changed without sedation, then patient began moving and had purposeful reaching for tube.   At the time of PCCM consult, no labs or head CT available. She initially required an epinephrine gtt, this was weaned off.  EKG without STEMI and Cardiology was also consulted.  Pertinent  Medical History   has a past medical history of Anemia, Anxiety, Chronic back pain, Chronic shoulder pain (10 years), Depression, Diabetes mellitus, Hyperlipidemia, and Hypertension.   Significant Hospital Events: Including procedures, antibiotic start and stop dates in addition to other pertinent events   12/14 witnessed out of hospital vfib arrest, 50 mins CPR 12/17 cont epileptogenicity on EEG.  12/18 weaned off sedation 12/19 night had another sz, put back on versed prop. DNR after family mtg, with plan for comfort in 2d if not significantly improved    Interim History / Subjective:  Temp variable still variable at times Remains on cEEG> evidence of epileptogenicity with generalized onset and increased risk of seizures.  No clinical seizures, remains on propofol, versed Low dose NE overnight, since off  Awaiting  family arrival   Objective   Blood pressure 114/64, pulse 65, temperature 98.6 F (37 C), resp. rate (!) 28, height '5\' 5"'$  (1.651 m), weight 67.8 kg, last menstrual period 02/13/2014, SpO2 97 %. CVP:  [1 mmHg-18 mmHg] 2 mmHg  Vent Mode: PRVC FiO2 (%):  [40 %] 40 % Set Rate:  [22 bmp] 22 bmp Vt Set:  [440 mL] 440 mL PEEP:  [5 cmH20] 5 cmH20 Plateau Pressure:  [13 JEH63-14 cmH20] 18 cmH20   Intake/Output Summary (Last 24 hours) at 09/02/2022 1112 Last data filed at 09/02/2022 1000 Gross per 24 hour  Intake 3140.31 ml  Output 2050 ml  Net 1090.31 ml   Filed Weights   08/31/22 0500 09/01/22 0359 09/02/22 0405  Weight: 65 kg 70.9 kg 67.8 kg   General:  critically ill older female lying in bed in NAD HEENT: MM pink/moist, pupils, 3/reactive, anicteric, absent corneals Neuro:  unresponsive to noxious stimuli, +gag and cough CV: rr, no murmur PULM:  full MV support, breathing over set rate, clear, scant secretions GI: soft, bs+, foley Extremities: warm/dry, trace LE edema  Skin: no rashes   Labs K 4.1, sCr 0.53  Resolved Hospital Problem list     Assessment & Plan:   OOH Vfib arrest Acute hypoxic respiratory failure Myoclonic seizures Anoxic brain injury  NSTEMI Ectopy, prolonged qtc  Acute HFrEF Hypotension  Fever, suspect central Leukocytosis, suspect reactive in setting of sz  DM2 Inadequate PO intake Elevated triglycerides on propofol  Goals of care DNR status -with seizure recurrence during  sedation holiday 12/18-19 night, prognosis for meaningful recovery is poor P - DNR - cont full MV support - prn NE for MAP > 65 - cont heparin gtt  - no ischemic workup given neuro prognosis  - trend BMET/ CBC - cEEG > stopping today, no changes, high potential for seizures - continue current propofol and versed - cont AEDs> keppra, VPA  -see IPAL 12/19 note -- family would like 2 days of continued then, if prognosis felt to be the same then compassionate transition to  comfort care (would be 12/21 night or possibly 12/22 day) > awaiting family arrival today.  No change or improvement in neuro or prognosis.  Planning on cont versed and propofol drips and AED's for seizure ppx w/ compassionate extubation when family ready   Best Practice (right click and "Reselect all SmartList Selections" daily)   Diet/type: TF DVT prophylaxis: heparin gtt GI prophylaxis: PPI Lines: Central line Foley:  Yes, and it is still needed Code Status:  full code Last date of multidisciplinary goals of care discussion [updated on phone 12/18   CRITICAL CARE Performed by: Kennieth Rad   Total critical care time: 35 minutes  Critical care time was exclusive of separately billable procedures and treating other patients. Critical care was necessary to treat or prevent imminent or life-threatening deterioration.  Critical care was time spent personally by me on the following activities: development of treatment plan with patient and/or surrogate as well as nursing, discussions with consultants, evaluation of patient's response to treatment, examination of patient, obtaining history from patient or surrogate, ordering and performing treatments and interventions, ordering and review of laboratory studies, ordering and review of radiographic studies, pulse oximetry and re-evaluation of patient's condition.     Kennieth Rad, Edmonia Caprio [facility] 09/02/2022, 11:12 AM  See Amion for pager If no response to pager, please call PCCM consult pager After 7:00 pm call Elink

## 2022-09-03 ENCOUNTER — Encounter (HOSPITAL_COMMUNITY): Payer: Self-pay

## 2022-09-03 DIAGNOSIS — Z9911 Dependence on respirator [ventilator] status: Secondary | ICD-10-CM | POA: Diagnosis not present

## 2022-09-03 DIAGNOSIS — I4901 Ventricular fibrillation: Secondary | ICD-10-CM | POA: Diagnosis not present

## 2022-09-03 DIAGNOSIS — G931 Anoxic brain damage, not elsewhere classified: Secondary | ICD-10-CM | POA: Diagnosis not present

## 2022-09-03 DIAGNOSIS — I469 Cardiac arrest, cause unspecified: Secondary | ICD-10-CM | POA: Diagnosis not present

## 2022-09-03 LAB — BASIC METABOLIC PANEL
Anion gap: 11 (ref 5–15)
BUN: 17 mg/dL (ref 6–20)
CO2: 23 mmol/L (ref 22–32)
Calcium: 8.3 mg/dL — ABNORMAL LOW (ref 8.9–10.3)
Chloride: 106 mmol/L (ref 98–111)
Creatinine, Ser: 0.56 mg/dL (ref 0.44–1.00)
GFR, Estimated: >60 mL/min (ref >60)
Glucose, Bld: 235 mg/dL — ABNORMAL HIGH (ref 70–99)
Potassium: 3.5 mmol/L (ref 3.5–5.1)
Sodium: 140 mmol/L (ref 135–145)

## 2022-09-03 LAB — GLUCOSE, CAPILLARY
Glucose-Capillary: 137 mg/dL — ABNORMAL HIGH (ref 70–99)
Glucose-Capillary: 166 mg/dL — ABNORMAL HIGH (ref 70–99)
Glucose-Capillary: 172 mg/dL — ABNORMAL HIGH (ref 70–99)
Glucose-Capillary: 181 mg/dL — ABNORMAL HIGH (ref 70–99)
Glucose-Capillary: 188 mg/dL — ABNORMAL HIGH (ref 70–99)
Glucose-Capillary: 205 mg/dL — ABNORMAL HIGH (ref 70–99)
Glucose-Capillary: 221 mg/dL — ABNORMAL HIGH (ref 70–99)

## 2022-09-03 LAB — TRIGLYCERIDES: Triglycerides: 853 mg/dL — ABNORMAL HIGH (ref <150)

## 2022-09-03 LAB — MAGNESIUM: Magnesium: 1.8 mg/dL (ref 1.7–2.4)

## 2022-09-03 MED ORDER — INSULIN ASPART 100 UNIT/ML IJ SOLN
4.0000 [IU] | INTRAMUSCULAR | Status: DC
  Administered 2022-09-03 – 2022-09-04 (×8): 4 [IU] via SUBCUTANEOUS

## 2022-09-03 MED ORDER — INSULIN GLARGINE-YFGN 100 UNIT/ML ~~LOC~~ SOLN
12.0000 [IU] | Freq: Every day | SUBCUTANEOUS | Status: DC
  Administered 2022-09-03 – 2022-09-04 (×2): 12 [IU] via SUBCUTANEOUS
  Filled 2022-09-03 (×2): qty 0.12

## 2022-09-03 MED ORDER — GLYCOPYRROLATE 0.2 MG/ML IJ SOLN
0.2000 mg | INTRAMUSCULAR | Status: DC | PRN
  Administered 2022-09-04: 0.2 mg via INTRAVENOUS
  Filled 2022-09-03: qty 1

## 2022-09-03 MED ORDER — MAGNESIUM SULFATE 2 GM/50ML IV SOLN
2.0000 g | Freq: Once | INTRAVENOUS | Status: AC
  Administered 2022-09-03: 2 g via INTRAVENOUS
  Filled 2022-09-03: qty 50

## 2022-09-03 MED ORDER — POTASSIUM CHLORIDE 20 MEQ PO PACK
40.0000 meq | PACK | Freq: Once | ORAL | Status: AC
  Administered 2022-09-03: 40 meq
  Filled 2022-09-03: qty 2

## 2022-09-03 NOTE — Progress Notes (Signed)
NAME:  Kaylee Murphy, MRN:  [id number], DOB:  Jan 26, 1961, LOS: 8 ADMISSION DATE:  08/22/2022, CONSULTATION DATE:  09/03/22 REFERRING MD:  Tegeler, CHIEF COMPLAINT:  cardiac arrest   History of Present Illness:  Kaylee Murphy is a 61 y.o. 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  History taken from Epic and report as no family is available; apparently she had seizure-like activity and EMS was called.  Pt was awake, but altered on arrival and then had a syncopal episode with loss of pulses.  Initial rhythm was Vfib. She underwent approximately 50 minutes of CPR with a run of Torsades and was intubated by EMS.   In the ED, ETT changed without sedation, then patient began moving and had purposeful reaching for tube.   At the time of PCCM consult, no labs or head CT available. She initially required an epinephrine gtt, this was weaned off.  EKG without STEMI and Cardiology was also consulted.  Pertinent  Medical History   has a past medical history of Anemia, Anxiety, Chronic back pain, Chronic shoulder pain (10 years), Depression, Diabetes mellitus, Hyperlipidemia, and Hypertension.   Significant Hospital Events: Including procedures, antibiotic start and stop dates in addition to other pertinent events   12/14 witnessed out of hospital vfib arrest, 50 mins CPR 12/17 cont epileptogenicity on EEG.  12/18 weaned off sedation 12/19 night had another sz, put back on versed prop. DNR after family mtg, with plan for comfort in 2d if not significantly improved   12/21 off cEEG, off NE, ongoing family discussions   Interim History / Subjective:  No events overnight  Frequent ectopy  Occasional facial twitching- with stimulation   Objective   Blood pressure 113/67, pulse 78, temperature 97.9 F (36.6 C), resp. rate (!) 22,  height '5\' 5"'$  (1.651 m), weight 67.5 kg, last menstrual period 02/13/2014, SpO2 99 %. CVP:  [2 mmHg-8 mmHg] 8 mmHg  Vent Mode: PRVC FiO2 (%):  [40 %-60 %] 50 % Set Rate:  [22 bmp] 22 bmp Vt Set:  [440 mL] 440 mL PEEP:  [5 cmH20] 5 cmH20 Plateau Pressure:  [18 cmH20-24 cmH20] 20 cmH20   Intake/Output Summary (Last 24 hours) at 09/03/2022 0825 Last data filed at 09/03/2022 0600 Gross per 24 hour  Intake 3017.58 ml  Output 2125 ml  Net 892.58 ml   Filed Weights   09/01/22 0359 09/02/22 0405 09/03/22 0500  Weight: 70.9 kg 67.8 kg 67.5 kg   General:  critically ill older female lying in bed in NAD- sedated HEENT: MM pink/moist, ETT/ OGT, pupils 5/reactive, left corneal intact, right absent Neuro:  does not f/c or w/d to noxious stimuli, +cough, could not elicit gag CV: rr ir, NSR with frequent PVC's/ bigeminy, no murmur  PULM:  non labored on MV, clear, no secretions, occasionally triggers breath GI: soft, bs+, NT, foley- cyu Extremities: warm/dry, no LE edema   K 3.5, sCr 0.56, Mag 1.8, triglycerides 853 Glucose trend > 180-220  Resolved Hospital Problem list     Assessment & Plan:   OOH Vfib arrest Acute hypoxic respiratory failure Myoclonic seizures Anoxic brain injury  NSTEMI Ectopy, prolonged qtc  Acute HFrEF Hypotension  Fever, suspect central Leukocytosis, suspect reactive in setting of sz  DM2 Inadequate PO intake Elevated triglycerides on propofol  Goals of  care DNR status -with seizure recurrence during sedation holiday 12/18-19 night, prognosis for meaningful recovery is poor P - DNR - cont full MV support - prn NE for MAP > 65 - cont heparin gtt  - no ischemic workup given neuro prognosis  - trend BMET/ CBC/ Mag - replete electrolytes prn  - cont current propofol and versed> titrate as needed to abate seizures - cont AEDs> keppra, VPA  - ongoing discussion with family of timing of compassionate extubation.  Family needed more time 12/21, plans to  return today for ongoing discussion, make a plan   - Planning on cont versed and propofol drips and AED's for seizure ppx w/ compassionate extubation when family ready   Best Practice (right click and "Reselect all SmartList Selections" daily)   Diet/type: TF DVT prophylaxis: heparin gtt GI prophylaxis: PPI Lines: Central line- PICC Foley:  Yes, and it is still needed Code Status:  full code Last date of multidisciplinary goals of care discussion [updated on phone 12/18   CRITICAL CARE Performed by: Kennieth Rad   Total critical care time: 32 minutes  Critical care time was exclusive of separately billable procedures and treating other patients. Critical care was necessary to treat or prevent imminent or life-threatening deterioration.  Critical care was time spent personally by me on the following activities: development of treatment plan with patient and/or surrogate as well as nursing, discussions with consultants, evaluation of patient's response to treatment, examination of patient, obtaining history from patient or surrogate, ordering and performing treatments and interventions, ordering and review of laboratory studies, ordering and review of radiographic studies, pulse oximetry and re-evaluation of patient's condition.     Kennieth Rad, Edmonia Caprio [facility] 09/03/2022, 8:25 AM  See Amion for pager If no response to pager, please call PCCM consult pager After 7:00 pm call Elink

## 2022-09-03 NOTE — Progress Notes (Signed)
   09/03/22 1230  Clinical Encounter Type  Visited With Patient not available;Family  Visit Type Initial (EOL)  Referral From Nurse  Consult/Referral To Chaplain   Chaplain responded to a call for prayer with family. As I arrived two family members were leaving. One family member told me they were leaving but thanked me for coming.  A third family member was distraught over the loss of his mother and was very emotional not understanding how her health turned so abruptly. He was not hearing anything and eventually left the room. I followed him to the outside of the building sat down with him and tried to talk with him. Two strangers also tried supporting him to no avail.   Danice Goltz [facility]  [phone]

## 2022-09-03 NOTE — IPAL (Signed)
  Interdisciplinary Goals of Care Family Meeting   Date carried out: 09/03/2022  Location of the meeting: Conference room  Member's involved: Nurse Practitioner, Bedside Registered Nurse, Family Member or next of kin, and Other: Kaylee Murphy's fiance, and 2 close friends  Insurance underwriter: Kaylee Murphy, son  Discussion: We discussed goals of care for Kaylee Murphy .  Kaylee Murphy relay's they are waiting on several more visitors this afternoon, but plans for compassionate extubation later this evening not to prolong her suffering.  Compassionate extubation explained to everyone except Kaylee Murphy who did not want to hear and does not want to be present at extubation.  Family stress the importance of not seeing her struggle.  All questions answered and ongoing emotional support offered.    Chaplin offered and accepted.   Code status:   Code Status: DNR   Disposition: Continue current acute care  Time spent for the meeting: 45mns     Kaylee Murphy, [place]12/22/2023, 11:56 AM  See Amion for pager If no response to pager, please call PCCM consult pager After 7:00 pm call Elink

## 2022-09-04 ENCOUNTER — Other Ambulatory Visit (HOSPITAL_COMMUNITY): Payer: Medicare Other

## 2022-09-04 DIAGNOSIS — Z9911 Dependence on respirator [ventilator] status: Secondary | ICD-10-CM | POA: Diagnosis not present

## 2022-09-04 DIAGNOSIS — E781 Pure hyperglyceridemia: Secondary | ICD-10-CM | POA: Diagnosis not present

## 2022-09-04 DIAGNOSIS — R569 Unspecified convulsions: Secondary | ICD-10-CM | POA: Diagnosis not present

## 2022-09-04 DIAGNOSIS — I469 Cardiac arrest, cause unspecified: Secondary | ICD-10-CM | POA: Diagnosis not present

## 2022-09-04 LAB — GLUCOSE, CAPILLARY
Glucose-Capillary: 178 mg/dL — ABNORMAL HIGH (ref 70–99)
Glucose-Capillary: 168 mg/dL — ABNORMAL HIGH (ref 70–99)
Glucose-Capillary: 175 mg/dL — ABNORMAL HIGH (ref 70–99)
Glucose-Capillary: 132 mg/dL — ABNORMAL HIGH (ref 70–99)
Glucose-Capillary: 172 mg/dL — ABNORMAL HIGH (ref 70–99)
Glucose-Capillary: 140 mg/dL — ABNORMAL HIGH (ref 70–99)
Glucose-Capillary: 184 mg/dL — ABNORMAL HIGH (ref 70–99)
Glucose-Capillary: 130 mg/dL — ABNORMAL HIGH (ref 70–99)
Glucose-Capillary: 133 mg/dL — ABNORMAL HIGH (ref 70–99)

## 2022-09-04 LAB — POCT I-STAT 7, (LYTES, BLD GAS, ICA,H+H)
Acid-Base Excess: 3 mmol/L — ABNORMAL HIGH (ref 0.0–2.0)
Acid-Base Excess: 4 mmol/L — ABNORMAL HIGH (ref 0.0–2.0)
Bicarbonate: 26.2 mmol/L (ref 20.0–28.0)
Bicarbonate: 27.1 mmol/L (ref 20.0–28.0)
Calcium, Ion: 1.22 mmol/L (ref 1.15–1.40)
Calcium, Ion: 1.2 mmol/L (ref 1.15–1.40)
HCT: 27 % — ABNORMAL LOW (ref 36.0–46.0)
HCT: 28 % — ABNORMAL LOW (ref 36.0–46.0)
Hemoglobin: 9.2 g/dL — ABNORMAL LOW (ref 12.0–15.0)
Hemoglobin: 9.5 g/dL — ABNORMAL LOW (ref 12.0–15.0)
O2 Saturation: 95 %
O2 Saturation: 99 %
Patient temperature: 36.9
Patient temperature: 36.9
Potassium: 3.6 mmol/L (ref 3.5–5.1)
Potassium: 4.3 mmol/L (ref 3.5–5.1)
Sodium: 144 mmol/L (ref 135–145)
Sodium: 144 mmol/L (ref 135–145)
TCO2: 27 mmol/L (ref 22–32)
TCO2: 28 mmol/L (ref 22–32)
pCO2 arterial: 34.1 mmHg (ref 32–48)
pCO2 arterial: 35.2 mmHg (ref 32–48)
pH, Arterial: 7.493 — ABNORMAL HIGH (ref 7.35–7.45)
pH, Arterial: 7.494 — ABNORMAL HIGH (ref 7.35–7.45)
pO2, Arterial: 69 mmHg — ABNORMAL LOW (ref 83–108)
pO2, Arterial: 134 mmHg — ABNORMAL HIGH (ref 83–108)

## 2022-09-04 LAB — TYPE AND SCREEN
ABO/RH(D): O POS
Antibody Screen: NEGATIVE

## 2022-09-04 LAB — COMPREHENSIVE METABOLIC PANEL
ALT: 50 U/L — ABNORMAL HIGH (ref 0–44)
AST: 59 U/L — ABNORMAL HIGH (ref 15–41)
Albumin: 1.8 g/dL — ABNORMAL LOW (ref 3.5–5.0)
Alkaline Phosphatase: 109 U/L (ref 38–126)
Anion gap: 10 (ref 5–15)
BUN: 16 mg/dL (ref 6–20)
CO2: 24 mmol/L (ref 22–32)
Calcium: 7.8 mg/dL — ABNORMAL LOW (ref 8.9–10.3)
Chloride: 105 mmol/L (ref 98–111)
Creatinine, Ser: 0.51 mg/dL (ref 0.44–1.00)
GFR, Estimated: >60 mL/min (ref >60)
Glucose, Bld: 181 mg/dL — ABNORMAL HIGH (ref 70–99)
Potassium: 3.4 mmol/L — ABNORMAL LOW (ref 3.5–5.1)
Sodium: 139 mmol/L (ref 135–145)
Total Bilirubin: 0.2 mg/dL — ABNORMAL LOW (ref 0.3–1.2)
Total Protein: 5.2 g/dL — ABNORMAL LOW (ref 6.5–8.1)

## 2022-09-04 LAB — URINALYSIS, ROUTINE W REFLEX MICROSCOPIC
Bilirubin Urine: NEGATIVE
Glucose, UA: NEGATIVE mg/dL
Hgb urine dipstick: NEGATIVE
Ketones, ur: NEGATIVE mg/dL
Nitrite: NEGATIVE
Protein, ur: 30 mg/dL — AB
Specific Gravity, Urine: 1.024 (ref 1.005–1.030)
pH: 5 (ref 5.0–8.0)

## 2022-09-04 LAB — BILIRUBIN, DIRECT: Bilirubin, Direct: <0.1 mg/dL (ref 0.0–0.2)

## 2022-09-04 LAB — CBC WITH DIFFERENTIAL/PLATELET
Abs Immature Granulocytes: 0.36 10*3/uL — ABNORMAL HIGH (ref 0.00–0.07)
Basophils Absolute: 0.2 10*3/uL — ABNORMAL HIGH (ref 0.0–0.1)
Basophils Relative: 1 %
Eosinophils Absolute: 1.5 10*3/uL — ABNORMAL HIGH (ref 0.0–0.5)
Eosinophils Relative: 8 %
HCT: 27.6 % — ABNORMAL LOW (ref 36.0–46.0)
Hemoglobin: 9.1 g/dL — ABNORMAL LOW (ref 12.0–15.0)
Immature Granulocytes: 2 %
Lymphocytes Relative: 22 %
Lymphs Abs: 4.2 10*3/uL — ABNORMAL HIGH (ref 0.7–4.0)
MCH: 27.7 pg (ref 26.0–34.0)
MCHC: 33 g/dL (ref 30.0–36.0)
MCV: 84.1 fL (ref 80.0–100.0)
Monocytes Absolute: 1.9 10*3/uL — ABNORMAL HIGH (ref 0.1–1.0)
Monocytes Relative: 10 %
Neutro Abs: 10.6 10*3/uL — ABNORMAL HIGH (ref 1.7–7.7)
Neutrophils Relative %: 57 %
Platelets: 346 10*3/uL (ref 150–400)
RBC: 3.28 MIL/uL — ABNORMAL LOW (ref 3.87–5.11)
RDW: 15.3 % (ref 11.5–15.5)
WBC: 18.7 10*3/uL — ABNORMAL HIGH (ref 4.0–10.5)
nRBC: 0 % (ref 0.0–0.2)

## 2022-09-04 LAB — APTT: aPTT: 30 seconds (ref 24–36)

## 2022-09-04 LAB — PROTIME-INR
INR: 1 (ref 0.8–1.2)
Prothrombin Time: 13.5 seconds (ref 11.4–15.2)

## 2022-09-04 LAB — SARS CORONAVIRUS 2 BY RT PCR: SARS Coronavirus 2 by RT PCR: NEGATIVE

## 2022-09-04 LAB — PHOSPHORUS: Phosphorus: 4 mg/dL (ref 2.5–4.6)

## 2022-09-04 LAB — TRIGLYCERIDES: Triglycerides: 828 mg/dL — ABNORMAL HIGH (ref <150)

## 2022-09-04 LAB — MAGNESIUM: Magnesium: 1.9 mg/dL (ref 1.7–2.4)

## 2022-09-04 MED ORDER — ACETAMINOPHEN 160 MG/5ML PO SOLN
650.0000 mg | Freq: Four times a day (QID) | ORAL | Status: DC | PRN
  Administered 2022-09-04: 650 mg via ORAL
  Filled 2022-09-04: qty 20.3

## 2022-09-04 MED ORDER — ACETAMINOPHEN 160 MG/5ML PO SOLN
650.0000 mg | Freq: Four times a day (QID) | ORAL | Status: DC | PRN
  Administered 2022-09-05 – 2022-09-06 (×2): 650 mg
  Filled 2022-09-04 (×3): qty 20.3

## 2022-09-04 MED ORDER — SODIUM CHLORIDE 0.9 % IV SOLN: INTRAVENOUS | Status: DC | PRN

## 2022-09-04 MED ORDER — INSULIN ASPART 100 UNIT/ML IJ SOLN
5.0000 [IU] | INTRAMUSCULAR | Status: DC
  Administered 2022-09-04 – 2022-09-06 (×12): 5 [IU] via SUBCUTANEOUS

## 2022-09-04 MED ORDER — POTASSIUM CHLORIDE 20 MEQ PO PACK
40.0000 meq | PACK | Freq: Once | ORAL | Status: AC
  Administered 2022-09-04: 40 meq
  Filled 2022-09-04: qty 2

## 2022-09-04 MED ORDER — LABETALOL HCL 5 MG/ML IV SOLN: 10.0000 mg | INTRAVENOUS | Status: DC | PRN

## 2022-09-04 MED ORDER — ACETAMINOPHEN 650 MG RE SUPP: 650.0000 mg | Freq: Four times a day (QID) | RECTAL | Status: DC | PRN

## 2022-09-04 MED ORDER — INSULIN GLARGINE-YFGN 100 UNIT/ML ~~LOC~~ SOLN
16.0000 [IU] | Freq: Every day | SUBCUTANEOUS | Status: DC
  Administered 2022-09-05 – 2022-09-07 (×3): 16 [IU] via SUBCUTANEOUS
  Filled 2022-09-04 (×3): qty 0.16

## 2022-09-04 MED ORDER — LORAZEPAM 2 MG/ML IJ SOLN: 2.0000 mg | INTRAMUSCULAR | Status: DC | PRN

## 2022-09-04 MED ORDER — HYDRALAZINE HCL 20 MG/ML IJ SOLN: 5.0000 mg | INTRAMUSCULAR | Status: DC | PRN

## 2022-09-04 MED ORDER — INSULIN GLARGINE-YFGN 100 UNIT/ML ~~LOC~~ SOLN
4.0000 [IU] | Freq: Once | SUBCUTANEOUS | Status: AC
  Administered 2022-09-04: 4 [IU] via SUBCUTANEOUS
  Filled 2022-09-04: qty 0.04

## 2022-09-04 MED ORDER — LACTATED RINGERS IV BOLUS
500.0000 mL | INTRAVENOUS | Status: DC | PRN
  Administered 2022-09-04: 500 mL via INTRAVENOUS

## 2022-09-04 NOTE — Progress Notes (Signed)
NAME:  Kaylee Murphy, MRN:  [id number], DOB:  05/02/1961, LOS: 9 ADMISSION DATE:  09/10/2022, CONSULTATION DATE:  09/04/22 REFERRING MD:  Tegeler, CHIEF COMPLAINT:  cardiac arrest   History of Present Illness:  Kaylee Murphy is a 61 y.o. 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  History taken from Epic and report as no family is available; apparently she had seizure-like activity and EMS was called.  Pt was awake, but altered on arrival and then had a syncopal episode with loss of pulses.  Initial rhythm was Vfib. She underwent approximately 50 minutes of CPR with a run of Torsades and was intubated by EMS.   In the ED, ETT changed without sedation, then patient began moving and had purposeful reaching for tube.   At the time of PCCM consult, no labs or head CT available. She initially required an epinephrine gtt, this was weaned off.  EKG without STEMI and Cardiology was also consulted.  Pertinent  Medical History   has a past medical history of Anemia, Anxiety, Chronic back pain, Chronic shoulder pain (10 years), Depression, Diabetes mellitus, Hyperlipidemia, and Hypertension.   Significant Hospital Events: Including procedures, antibiotic start and stop dates in addition to other pertinent events   12/14 witnessed out of hospital vfib arrest, 50 mins CPR 12/17 cont epileptogenicity on EEG.  12/18 weaned off sedation 12/19 night had another sz, put back on versed prop. DNR after family mtg, with plan for comfort in 2d if not significantly improved   12/21 off cEEG, off NE, ongoing family discussions   Interim History / Subjective:  Family wants to do honor bridge DCD. When propofol was turned down having more frequent blinking and facial twitching.  Objective   Blood pressure 115/63, pulse 68, temperature 98.2 F  (36.8 C), resp. rate (!) 22, height '5\' 5"'$  (1.651 m), weight 66.4 kg, last menstrual period 02/13/2014, SpO2 97 %.    Vent Mode: PRVC FiO2 (%):  [30 %-60 %] 60 % Set Rate:  [22 bmp] 22 bmp Vt Set:  [440 mL] 440 mL PEEP:  [5 cmH20] 5 cmH20 Plateau Pressure:  [19 cmH20-22 cmH20] 22 cmH20   Intake/Output Summary (Last 24 hours) at 09/04/2022 1438 Last data filed at 09/04/2022 1300 Gross per 24 hour  Intake 2649.4 ml  Output 2252 ml  Net 397.4 ml    Filed Weights   09/02/22 0405 09/03/22 0500 09/04/22 0500  Weight: 67.8 kg 67.5 kg 66.4 kg   General:  critically ill appearing woman intubated, sedated HEENT: [place]/AT, eyes anicteric Neuro:  RASS -5, blinking more during stimulation. PERRL, no corneals. + doll's eyes. No withdrawal from pain x 4 extremities. Intact tracheal cough, no pharyngeal gag.  CV: S1S2, irreg rhythm.   PULM:  non labored on MV, clear, no secretions, occasionally triggers breaths GI: soft, NT, ND Extremities: no cyanosis or edema  Labs reviewed.   Resolved Hospital Problem list     Assessment & Plan:   OOH Vfib arrest Acute hypoxic respiratory failure Myoclonic seizures Anoxic brain injury  NSTEMI Ectopy, prolonged QTc  Acute HFrEF Hypotension  Fever, suspect central Leukocytosis, suspect reactive in setting of sz  DM2 Inadequate PO intake Elevated transaminases Elevated triglycerides on propofol  Goals of care DNR status Severe hypertriglyceridemia due to propofol Hypokalemia Anemia -with seizure  recurrence during sedation holiday 12/18-19 night, prognosis for meaningful recovery is poor P - [name] now-- workup as necessary for them. Asking for Aline for Q4h ABGs. Anticipate need for bronchscopy.  -K+ repleted -Con't meds to maintain her comfort-- versed, propofol. Need to get her off propofol soon due to severely elevated triglycerides. -increase insulin to decrease TG-- increase TF coverage to 5 units Q4h and semglee to 16 units  dialy   Best Practice (right click and "Reselect all SmartList Selections" daily)   Diet/type: TF DVT prophylaxis: heparin gtt GI prophylaxis: PPI Lines: Central line- PICC Foley:  Yes, and it is still needed Code Status:  full code Last date of multidisciplinary goals of care discussion [updated on phone 12/18  Julian Hy, DO 09/04/22 2:44 PM [facility]  For contact information, see Amion. If no response to pager, please call PCCM consult pager. After hours, 7PM- 7AM, please call Elink.

## 2022-09-04 NOTE — Procedures (Signed)
Arterial Line Insertion Start/End12/23/2023 3:00 PM, 09/04/2022 3:00 PM  Patient location: ICU. Preanesthetic checklist: patient identified, site marked, monitors and equipment checked and timeout performed Right, radial was placed Catheter size: 20 G Hand hygiene performed , maximum sterile barriers used  and Seldinger technique used Allen's test indicative of satisfactory collateral circulation Attempts: 1 Procedure performed without using ultrasound guided technique. Following insertion, dressing applied and Biopatch. Post procedure assessment: normal  Patient tolerated the procedure well with no immediate complications.

## 2022-09-04 NOTE — Progress Notes (Signed)
New foley placed per honor bridge.  Replaced d/t previous foley being in place since 12/15.  Urine culture/UA obtained from new foley.

## 2022-09-04 NOTE — Progress Notes (Signed)
Pt transported from [place] to [place] and back without any complications. RN at bedside.

## 2022-09-04 NOTE — Progress Notes (Signed)
Vent settings changed per honor bridge team:  peep=10, fio2=60%

## 2022-09-05 ENCOUNTER — Other Ambulatory Visit (HOSPITAL_COMMUNITY): Payer: Medicare Other

## 2022-09-05 DIAGNOSIS — J9601 Acute respiratory failure with hypoxia: Secondary | ICD-10-CM | POA: Diagnosis not present

## 2022-09-05 DIAGNOSIS — Z9911 Dependence on respirator [ventilator] status: Secondary | ICD-10-CM | POA: Diagnosis not present

## 2022-09-05 DIAGNOSIS — E876 Hypokalemia: Secondary | ICD-10-CM | POA: Diagnosis not present

## 2022-09-05 DIAGNOSIS — I469 Cardiac arrest, cause unspecified: Secondary | ICD-10-CM | POA: Diagnosis not present

## 2022-09-05 LAB — COMPREHENSIVE METABOLIC PANEL
ALT: 45 U/L — ABNORMAL HIGH (ref 0–44)
ALT: 42 U/L (ref 0–44)
ALT: 40 U/L (ref 0–44)
AST: 51 U/L — ABNORMAL HIGH (ref 15–41)
AST: 48 U/L — ABNORMAL HIGH (ref 15–41)
AST: 47 U/L — ABNORMAL HIGH (ref 15–41)
Albumin: 1.9 g/dL — ABNORMAL LOW (ref 3.5–5.0)
Albumin: 1.8 g/dL — ABNORMAL LOW (ref 3.5–5.0)
Albumin: 1.9 g/dL — ABNORMAL LOW (ref 3.5–5.0)
Alkaline Phosphatase: 112 U/L (ref 38–126)
Alkaline Phosphatase: 105 U/L (ref 38–126)
Alkaline Phosphatase: 103 U/L (ref 38–126)
Anion gap: 9 (ref 5–15)
Anion gap: 7 (ref 5–15)
Anion gap: 8 (ref 5–15)
BUN: 15 mg/dL (ref 6–20)
BUN: 17 mg/dL (ref 6–20)
BUN: 15 mg/dL (ref 6–20)
CO2: 26 mmol/L (ref 22–32)
CO2: 25 mmol/L (ref 22–32)
CO2: 25 mmol/L (ref 22–32)
Calcium: 8.5 mg/dL — ABNORMAL LOW (ref 8.9–10.3)
Calcium: 8.4 mg/dL — ABNORMAL LOW (ref 8.9–10.3)
Calcium: 8.4 mg/dL — ABNORMAL LOW (ref 8.9–10.3)
Chloride: 109 mmol/L (ref 98–111)
Chloride: 107 mmol/L (ref 98–111)
Chloride: 106 mmol/L (ref 98–111)
Creatinine, Ser: 0.55 mg/dL (ref 0.44–1.00)
Creatinine, Ser: 0.56 mg/dL (ref 0.44–1.00)
Creatinine, Ser: 0.54 mg/dL (ref 0.44–1.00)
GFR, Estimated: >60 mL/min (ref >60)
GFR, Estimated: >60 mL/min (ref >60)
GFR, Estimated: >60 mL/min (ref >60)
Glucose, Bld: 133 mg/dL — ABNORMAL HIGH (ref 70–99)
Glucose, Bld: 188 mg/dL — ABNORMAL HIGH (ref 70–99)
Glucose, Bld: 219 mg/dL — ABNORMAL HIGH (ref 70–99)
Potassium: 3.5 mmol/L (ref 3.5–5.1)
Potassium: 3.7 mmol/L (ref 3.5–5.1)
Potassium: 4 mmol/L (ref 3.5–5.1)
Sodium: 144 mmol/L (ref 135–145)
Sodium: 139 mmol/L (ref 135–145)
Sodium: 139 mmol/L (ref 135–145)
Total Bilirubin: 0.2 mg/dL — ABNORMAL LOW (ref 0.3–1.2)
Total Bilirubin: 0.3 mg/dL (ref 0.3–1.2)
Total Bilirubin: 0.3 mg/dL (ref 0.3–1.2)
Total Protein: 6 g/dL — ABNORMAL LOW (ref 6.5–8.1)
Total Protein: 6.1 g/dL — ABNORMAL LOW (ref 6.5–8.1)
Total Protein: 6.2 g/dL — ABNORMAL LOW (ref 6.5–8.1)

## 2022-09-05 LAB — CBC WITH DIFFERENTIAL/PLATELET
Abs Immature Granulocytes: 0.56 10*3/uL — ABNORMAL HIGH (ref 0.00–0.07)
Abs Immature Granulocytes: 0.55 10*3/uL — ABNORMAL HIGH (ref 0.00–0.07)
Abs Immature Granulocytes: 0.71 10*3/uL — ABNORMAL HIGH (ref 0.00–0.07)
Basophils Absolute: 0.1 10*3/uL (ref 0.0–0.1)
Basophils Absolute: 0.2 10*3/uL — ABNORMAL HIGH (ref 0.0–0.1)
Basophils Absolute: 0.1 10*3/uL (ref 0.0–0.1)
Basophils Relative: 1 %
Basophils Relative: 1 %
Basophils Relative: 1 %
Eosinophils Absolute: 1.5 10*3/uL — ABNORMAL HIGH (ref 0.0–0.5)
Eosinophils Absolute: 1.4 10*3/uL — ABNORMAL HIGH (ref 0.0–0.5)
Eosinophils Absolute: 1.2 10*3/uL — ABNORMAL HIGH (ref 0.0–0.5)
Eosinophils Relative: 8 %
Eosinophils Relative: 8 %
Eosinophils Relative: 7 %
HCT: 27.9 % — ABNORMAL LOW (ref 36.0–46.0)
HCT: 27.3 % — ABNORMAL LOW (ref 36.0–46.0)
HCT: 27.9 % — ABNORMAL LOW (ref 36.0–46.0)
Hemoglobin: 8.9 g/dL — ABNORMAL LOW (ref 12.0–15.0)
Hemoglobin: 8.8 g/dL — ABNORMAL LOW (ref 12.0–15.0)
Hemoglobin: 8.8 g/dL — ABNORMAL LOW (ref 12.0–15.0)
Immature Granulocytes: 3 %
Immature Granulocytes: 3 %
Immature Granulocytes: 4 %
Lymphocytes Relative: 26 %
Lymphocytes Relative: 25 %
Lymphocytes Relative: 19 %
Lymphs Abs: 4.9 10*3/uL — ABNORMAL HIGH (ref 0.7–4.0)
Lymphs Abs: 4.2 10*3/uL — ABNORMAL HIGH (ref 0.7–4.0)
Lymphs Abs: 3.6 10*3/uL (ref 0.7–4.0)
MCH: 26.3 pg (ref 26.0–34.0)
MCH: 26.7 pg (ref 26.0–34.0)
MCH: 26.3 pg (ref 26.0–34.0)
MCHC: 31.9 g/dL (ref 30.0–36.0)
MCHC: 32.2 g/dL (ref 30.0–36.0)
MCHC: 31.5 g/dL (ref 30.0–36.0)
MCV: 82.3 fL (ref 80.0–100.0)
MCV: 82.7 fL (ref 80.0–100.0)
MCV: 83.3 fL (ref 80.0–100.0)
Monocytes Absolute: 1.4 10*3/uL — ABNORMAL HIGH (ref 0.1–1.0)
Monocytes Absolute: 1.4 10*3/uL — ABNORMAL HIGH (ref 0.1–1.0)
Monocytes Absolute: 1.6 10*3/uL — ABNORMAL HIGH (ref 0.1–1.0)
Monocytes Relative: 8 %
Monocytes Relative: 8 %
Monocytes Relative: 9 %
Neutro Abs: 10 10*3/uL — ABNORMAL HIGH (ref 1.7–7.7)
Neutro Abs: 9.6 10*3/uL — ABNORMAL HIGH (ref 1.7–7.7)
Neutro Abs: 11.6 10*3/uL — ABNORMAL HIGH (ref 1.7–7.7)
Neutrophils Relative %: 54 %
Neutrophils Relative %: 55 %
Neutrophils Relative %: 60 %
Platelets: 372 10*3/uL (ref 150–400)
Platelets: 362 10*3/uL (ref 150–400)
Platelets: 384 10*3/uL (ref 150–400)
RBC: 3.39 MIL/uL — ABNORMAL LOW (ref 3.87–5.11)
RBC: 3.3 MIL/uL — ABNORMAL LOW (ref 3.87–5.11)
RBC: 3.35 MIL/uL — ABNORMAL LOW (ref 3.87–5.11)
RDW: 15.3 % (ref 11.5–15.5)
RDW: 15.2 % (ref 11.5–15.5)
RDW: 15 % (ref 11.5–15.5)
WBC: 18.4 10*3/uL — ABNORMAL HIGH (ref 4.0–10.5)
WBC: 17.3 10*3/uL — ABNORMAL HIGH (ref 4.0–10.5)
WBC: 18.8 10*3/uL — ABNORMAL HIGH (ref 4.0–10.5)
nRBC: 0 % (ref 0.0–0.2)
nRBC: 0 % (ref 0.0–0.2)
nRBC: 0 % (ref 0.0–0.2)

## 2022-09-05 LAB — POCT I-STAT 7, (LYTES, BLD GAS, ICA,H+H)
Acid-Base Excess: 3 mmol/L — ABNORMAL HIGH (ref 0.0–2.0)
Acid-Base Excess: 3 mmol/L — ABNORMAL HIGH (ref 0.0–2.0)
Acid-Base Excess: 4 mmol/L — ABNORMAL HIGH (ref 0.0–2.0)
Acid-Base Excess: 5 mmol/L — ABNORMAL HIGH (ref 0.0–2.0)
Bicarbonate: 26.7 mmol/L (ref 20.0–28.0)
Bicarbonate: 26.8 mmol/L (ref 20.0–28.0)
Bicarbonate: 27 mmol/L (ref 20.0–28.0)
Bicarbonate: 28.8 mmol/L — ABNORMAL HIGH (ref 20.0–28.0)
Calcium, Ion: 1.2 mmol/L (ref 1.15–1.40)
Calcium, Ion: 1.21 mmol/L (ref 1.15–1.40)
Calcium, Ion: 1.22 mmol/L (ref 1.15–1.40)
Calcium, Ion: 1.23 mmol/L (ref 1.15–1.40)
HCT: 25 % — ABNORMAL LOW (ref 36.0–46.0)
HCT: 26 % — ABNORMAL LOW (ref 36.0–46.0)
HCT: 28 % — ABNORMAL LOW (ref 36.0–46.0)
HCT: 28 % — ABNORMAL LOW (ref 36.0–46.0)
Hemoglobin: 8.5 g/dL — ABNORMAL LOW (ref 12.0–15.0)
Hemoglobin: 8.8 g/dL — ABNORMAL LOW (ref 12.0–15.0)
Hemoglobin: 9.5 g/dL — ABNORMAL LOW (ref 12.0–15.0)
Hemoglobin: 9.5 g/dL — ABNORMAL LOW (ref 12.0–15.0)
O2 Saturation: 100 %
O2 Saturation: 97 %
O2 Saturation: 99 %
O2 Saturation: 99 %
Patient temperature: 37
Patient temperature: 37
Patient temperature: 36.7
Potassium: 3.4 mmol/L — ABNORMAL LOW (ref 3.5–5.1)
Potassium: 3.5 mmol/L (ref 3.5–5.1)
Potassium: 3.8 mmol/L (ref 3.5–5.1)
Potassium: 4 mmol/L (ref 3.5–5.1)
Sodium: 140 mmol/L (ref 135–145)
Sodium: 141 mmol/L (ref 135–145)
Sodium: 143 mmol/L (ref 135–145)
Sodium: 144 mmol/L (ref 135–145)
TCO2: 28 mmol/L (ref 22–32)
TCO2: 28 mmol/L (ref 22–32)
TCO2: 28 mmol/L (ref 22–32)
TCO2: 30 mmol/L (ref 22–32)
pCO2 arterial: 35.6 mmHg (ref 32–48)
pCO2 arterial: 37 mmHg (ref 32–48)
pCO2 arterial: 37.7 mmHg (ref 32–48)
pCO2 arterial: 38.6 mmHg (ref 32–48)
pH, Arterial: 7.458 — ABNORMAL HIGH (ref 7.35–7.45)
pH, Arterial: 7.468 — ABNORMAL HIGH (ref 7.35–7.45)
pH, Arterial: 7.482 — ABNORMAL HIGH (ref 7.35–7.45)
pH, Arterial: 7.488 — ABNORMAL HIGH (ref 7.35–7.45)
pO2, Arterial: 111 mmHg — ABNORMAL HIGH (ref 83–108)
pO2, Arterial: 149 mmHg — ABNORMAL HIGH (ref 83–108)
pO2, Arterial: 170 mmHg — ABNORMAL HIGH (ref 83–108)
pO2, Arterial: 82 mmHg — ABNORMAL LOW (ref 83–108)

## 2022-09-05 LAB — BILIRUBIN, DIRECT
Bilirubin, Direct: <0.1 mg/dL (ref 0.0–0.2)
Bilirubin, Direct: <0.1 mg/dL (ref 0.0–0.2)

## 2022-09-05 LAB — BILIRUBIN, FRACTIONATED(TOT/DIR/INDIR)
Bilirubin, Direct: <0.1 mg/dL (ref 0.0–0.2)
Total Bilirubin: 0.3 mg/dL (ref 0.3–1.2)

## 2022-09-05 LAB — APTT: aPTT: 32 seconds (ref 24–36)

## 2022-09-05 LAB — MAGNESIUM
Magnesium: 1.8 mg/dL (ref 1.7–2.4)
Magnesium: 2 mg/dL (ref 1.7–2.4)

## 2022-09-05 LAB — GLUCOSE, CAPILLARY
Glucose-Capillary: 148 mg/dL — ABNORMAL HIGH (ref 70–99)
Glucose-Capillary: 179 mg/dL — ABNORMAL HIGH (ref 70–99)
Glucose-Capillary: 180 mg/dL — ABNORMAL HIGH (ref 70–99)
Glucose-Capillary: 213 mg/dL — ABNORMAL HIGH (ref 70–99)
Glucose-Capillary: 203 mg/dL — ABNORMAL HIGH (ref 70–99)

## 2022-09-05 LAB — PHOSPHORUS
Phosphorus: 4.4 mg/dL (ref 2.5–4.6)
Phosphorus: 4.4 mg/dL (ref 2.5–4.6)

## 2022-09-05 LAB — PROTIME-INR
INR: 1 (ref 0.8–1.2)
Prothrombin Time: 12.7 seconds (ref 11.4–15.2)

## 2022-09-05 LAB — PREGNANCY, URINE: Preg Test, Ur: NEGATIVE

## 2022-09-05 LAB — TRIGLYCERIDES: Triglycerides: 123 mg/dL (ref <150)

## 2022-09-05 MED ORDER — POTASSIUM CHLORIDE 20 MEQ PO PACK
40.0000 meq | PACK | Freq: Once | ORAL | Status: AC
  Administered 2022-09-05: 40 meq
  Filled 2022-09-05: qty 2

## 2022-09-05 NOTE — Progress Notes (Signed)
NAME:  Kaylee Murphy, MRN:  [id number], DOB:  10-16-1960, LOS: 49 ADMISSION DATE:  08/30/2022, CONSULTATION DATE:  09/05/22 REFERRING MD:  Tegeler, CHIEF COMPLAINT:  cardiac arrest   History of Present Illness:  Kaylee Murphy is a 61 y.o. 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  History taken from Epic and report as no family is available; apparently she had seizure-like activity and EMS was called.  Pt was awake, but altered on arrival and then had a syncopal episode with loss of pulses.  Initial rhythm was Vfib. She underwent approximately 50 minutes of CPR with a run of Torsades and was intubated by EMS.   In the ED, ETT changed without sedation, then patient began moving and had purposeful reaching for tube.   At the time of PCCM consult, no labs or head CT available. She initially required an epinephrine gtt, this was weaned off.  EKG without STEMI and Cardiology was also consulted.  Pertinent  Medical History   has a past medical history of Anemia, Anxiety, Chronic back pain, Chronic shoulder pain (10 years), Depression, Diabetes mellitus, Hyperlipidemia, and Hypertension.   Significant Hospital Events: Including procedures, antibiotic start and stop dates in addition to other pertinent events   12/14 witnessed out of hospital vfib arrest, 50 mins CPR 12/17 cont epileptogenicity on EEG.  12/18 weaned off sedation 12/19 night had another sz, put back on versed prop. DNR after family mtg, with plan for comfort in 2d if not significantly improved   12/21 off cEEG, off NE, ongoing family discussions   Interim History / Subjective:  Kaylee Murphy case. No family at bedside this morning.   Objective   Blood pressure 113/77, pulse 75, temperature 98.2 F (36.8 C), resp. rate (!) 22, height '5\' 5"'$  (1.651 m), weight  69.8 kg, last menstrual period 02/13/2014, SpO2 100 %.    Vent Mode: PRVC FiO2 (%):  [50 %-80 %] 70 % Set Rate:  [22 bmp] 22 bmp Vt Set:  [440 mL] 440 mL PEEP:  [5 cmH20-10 cmH20] 10 cmH20 Plateau Pressure:  [20 cmH20-27 cmH20] 26 cmH20   Intake/Output Summary (Last 24 hours) at 09/05/2022 1137 Last data filed at 09/05/2022 1100 Gross per 24 hour  Intake 3514.82 ml  Output 1545 ml  Net 1969.82 ml    Filed Weights   09/03/22 0500 09/04/22 0500 09/05/22 0300  Weight: 67.5 kg 66.4 kg 69.8 kg   General:  critically ill appearing woman lying in bed in NAD; examined on propofol & versed HEENT: [place]/AT, eyes anicteric Neuro:  No response to verbal stimulation. No withdrawal from pain. PERRL, intact dolls eyes, no corneals. No pharyngel gag. +tracheal cough. CV: S1S2, RRR PULM:  synchronous with MV, CTAB GI: soft, NT Extremities: no c/c/e  Labs reviewed.   Resolved Hospital Problem list     Assessment & Plan:   OOH Vfib arrest Acute hypoxic respiratory failure requiring MV Myoclonic seizures Anoxic brain injury  NSTEMI Ectopy, prolonged QTc  Acute HFrEF Hypotension  Fever, suspect central Leukocytosis, suspect reactive in setting of sz  DM2 Inadequate PO intake Elevated transaminases Elevated triglycerides on propofol  Goals of care DNR status Severe hypertriglyceridemia due to propofol> improved (suspect this was due to drawing upstream from the line. Hypokalemia Anemia -with seizure recurrence during sedation holiday 12/18-19  night, prognosis for meaningful recovery is poor P -Kaylee Murphy case-- will con't care as they require to facilitate her workup. Main priority is assuring her comfort. -K+ repletion -Still needs propofol and versed infusions. Recommend increasing versed to hopefully decrease propofol requirements. -improved BG control with increase insulin   Best Practice (right click and "Reselect all SmartList Selections" daily)   Diet/type: TF DVT  prophylaxis: heparin gtt GI prophylaxis: PPI Lines: Central line- PICC Foley:  Yes, and it is still needed Code Status:  full code Last date of multidisciplinary goals of care discussion [updated 12/12 in family meeting  Julian Hy, DO 09/05/22 11:42 AM [facility]  For contact information, see Amion. If no response to pager, please call PCCM consult pager. After hours, 7PM- 7AM, please call Elink.

## 2022-09-06 ENCOUNTER — Other Ambulatory Visit (HOSPITAL_COMMUNITY): Payer: Medicare Other

## 2022-09-06 DIAGNOSIS — I469 Cardiac arrest, cause unspecified: Secondary | ICD-10-CM | POA: Diagnosis not present

## 2022-09-06 DIAGNOSIS — Z9911 Dependence on respirator [ventilator] status: Secondary | ICD-10-CM | POA: Diagnosis not present

## 2022-09-06 DIAGNOSIS — Z529 Donor of unspecified organ or tissue: Secondary | ICD-10-CM

## 2022-09-06 DIAGNOSIS — G931 Anoxic brain damage, not elsewhere classified: Secondary | ICD-10-CM | POA: Diagnosis not present

## 2022-09-06 LAB — COMPREHENSIVE METABOLIC PANEL
ALT: 34 U/L (ref 0–44)
ALT: 31 U/L (ref 0–44)
AST: 48 U/L — ABNORMAL HIGH (ref 15–41)
AST: 42 U/L — ABNORMAL HIGH (ref 15–41)
Albumin: 1.9 g/dL — ABNORMAL LOW (ref 3.5–5.0)
Albumin: 1.9 g/dL — ABNORMAL LOW (ref 3.5–5.0)
Alkaline Phosphatase: 101 U/L (ref 38–126)
Alkaline Phosphatase: 100 U/L (ref 38–126)
Anion gap: 8 (ref 5–15)
Anion gap: 10 (ref 5–15)
BUN: 14 mg/dL (ref 6–20)
BUN: 14 mg/dL (ref 6–20)
CO2: 25 mmol/L (ref 22–32)
CO2: 26 mmol/L (ref 22–32)
Calcium: 8.4 mg/dL — ABNORMAL LOW (ref 8.9–10.3)
Calcium: 8.3 mg/dL — ABNORMAL LOW (ref 8.9–10.3)
Chloride: 107 mmol/L (ref 98–111)
Chloride: 109 mmol/L (ref 98–111)
Creatinine, Ser: 0.55 mg/dL (ref 0.44–1.00)
Creatinine, Ser: 0.42 mg/dL — ABNORMAL LOW (ref 0.44–1.00)
GFR, Estimated: >60 mL/min (ref >60)
GFR, Estimated: >60 mL/min (ref >60)
Glucose, Bld: 230 mg/dL — ABNORMAL HIGH (ref 70–99)
Glucose, Bld: 86 mg/dL (ref 70–99)
Potassium: 3.9 mmol/L (ref 3.5–5.1)
Potassium: 3.8 mmol/L (ref 3.5–5.1)
Sodium: 140 mmol/L (ref 135–145)
Sodium: 145 mmol/L (ref 135–145)
Total Bilirubin: 0.3 mg/dL (ref 0.3–1.2)
Total Bilirubin: 0.3 mg/dL (ref 0.3–1.2)
Total Protein: 6.1 g/dL — ABNORMAL LOW (ref 6.5–8.1)
Total Protein: 6 g/dL — ABNORMAL LOW (ref 6.5–8.1)

## 2022-09-06 LAB — CBC WITH DIFFERENTIAL/PLATELET
Abs Immature Granulocytes: 0.68 10*3/uL — ABNORMAL HIGH (ref 0.00–0.07)
Abs Immature Granulocytes: 0.5 10*3/uL — ABNORMAL HIGH (ref 0.00–0.07)
Abs Immature Granulocytes: 0.49 10*3/uL — ABNORMAL HIGH (ref 0.00–0.07)
Basophils Absolute: 0.1 10*3/uL (ref 0.0–0.1)
Basophils Absolute: 0.1 10*3/uL (ref 0.0–0.1)
Basophils Absolute: 0.1 10*3/uL (ref 0.0–0.1)
Basophils Relative: 1 %
Basophils Relative: 1 %
Basophils Relative: 1 %
Eosinophils Absolute: 0.9 10*3/uL — ABNORMAL HIGH (ref 0.0–0.5)
Eosinophils Absolute: 0.9 10*3/uL — ABNORMAL HIGH (ref 0.0–0.5)
Eosinophils Absolute: 0.9 10*3/uL — ABNORMAL HIGH (ref 0.0–0.5)
Eosinophils Relative: 5 %
Eosinophils Relative: 4 %
Eosinophils Relative: 5 %
HCT: 27.1 % — ABNORMAL LOW (ref 36.0–46.0)
HCT: 25.9 % — ABNORMAL LOW (ref 36.0–46.0)
HCT: 26.3 % — ABNORMAL LOW (ref 36.0–46.0)
Hemoglobin: 8.8 g/dL — ABNORMAL LOW (ref 12.0–15.0)
Hemoglobin: 8.2 g/dL — ABNORMAL LOW (ref 12.0–15.0)
Hemoglobin: 8.2 g/dL — ABNORMAL LOW (ref 12.0–15.0)
Immature Granulocytes: 4 %
Immature Granulocytes: 3 %
Immature Granulocytes: 3 %
Lymphocytes Relative: 21 %
Lymphocytes Relative: 25 %
Lymphocytes Relative: 24 %
Lymphs Abs: 4 10*3/uL (ref 0.7–4.0)
Lymphs Abs: 4.9 10*3/uL — ABNORMAL HIGH (ref 0.7–4.0)
Lymphs Abs: 4.5 10*3/uL — ABNORMAL HIGH (ref 0.7–4.0)
MCH: 27 pg (ref 26.0–34.0)
MCH: 26.5 pg (ref 26.0–34.0)
MCH: 26.5 pg (ref 26.0–34.0)
MCHC: 32.5 g/dL (ref 30.0–36.0)
MCHC: 31.7 g/dL (ref 30.0–36.0)
MCHC: 31.2 g/dL (ref 30.0–36.0)
MCV: 83.1 fL (ref 80.0–100.0)
MCV: 83.5 fL (ref 80.0–100.0)
MCV: 84.8 fL (ref 80.0–100.0)
Monocytes Absolute: 1.4 10*3/uL — ABNORMAL HIGH (ref 0.1–1.0)
Monocytes Absolute: 1.6 10*3/uL — ABNORMAL HIGH (ref 0.1–1.0)
Monocytes Absolute: 1.4 10*3/uL — ABNORMAL HIGH (ref 0.1–1.0)
Monocytes Relative: 7 %
Monocytes Relative: 8 %
Monocytes Relative: 7 %
Neutro Abs: 12 10*3/uL — ABNORMAL HIGH (ref 1.7–7.7)
Neutro Abs: 11.5 10*3/uL — ABNORMAL HIGH (ref 1.7–7.7)
Neutro Abs: 11.2 10*3/uL — ABNORMAL HIGH (ref 1.7–7.7)
Neutrophils Relative %: 62 %
Neutrophils Relative %: 59 %
Neutrophils Relative %: 60 %
Platelets: 380 10*3/uL (ref 150–400)
Platelets: 394 10*3/uL (ref 150–400)
Platelets: 407 10*3/uL — ABNORMAL HIGH (ref 150–400)
RBC: 3.26 MIL/uL — ABNORMAL LOW (ref 3.87–5.11)
RBC: 3.1 MIL/uL — ABNORMAL LOW (ref 3.87–5.11)
RBC: 3.1 MIL/uL — ABNORMAL LOW (ref 3.87–5.11)
RDW: 15.1 % (ref 11.5–15.5)
RDW: 15.2 % (ref 11.5–15.5)
RDW: 15.4 % (ref 11.5–15.5)
WBC: 19.1 10*3/uL — ABNORMAL HIGH (ref 4.0–10.5)
WBC: 19.4 10*3/uL — ABNORMAL HIGH (ref 4.0–10.5)
WBC: 18.5 10*3/uL — ABNORMAL HIGH (ref 4.0–10.5)
nRBC: 0 % (ref 0.0–0.2)
nRBC: 0 % (ref 0.0–0.2)
nRBC: 0 % (ref 0.0–0.2)

## 2022-09-06 LAB — POCT I-STAT 7, (LYTES, BLD GAS, ICA,H+H)
Acid-Base Excess: 2 mmol/L (ref 0.0–2.0)
Acid-Base Excess: 4 mmol/L — ABNORMAL HIGH (ref 0.0–2.0)
Acid-Base Excess: 4 mmol/L — ABNORMAL HIGH (ref 0.0–2.0)
Acid-Base Excess: 4 mmol/L — ABNORMAL HIGH (ref 0.0–2.0)
Acid-Base Excess: 5 mmol/L — ABNORMAL HIGH (ref 0.0–2.0)
Acid-Base Excess: 6 mmol/L — ABNORMAL HIGH (ref 0.0–2.0)
Bicarbonate: 27 mmol/L (ref 20.0–28.0)
Bicarbonate: 27.2 mmol/L (ref 20.0–28.0)
Bicarbonate: 27.2 mmol/L (ref 20.0–28.0)
Bicarbonate: 27.4 mmol/L (ref 20.0–28.0)
Bicarbonate: 28 mmol/L (ref 20.0–28.0)
Bicarbonate: 29.4 mmol/L — ABNORMAL HIGH (ref 20.0–28.0)
Calcium, Ion: 1.17 mmol/L (ref 1.15–1.40)
Calcium, Ion: 1.18 mmol/L (ref 1.15–1.40)
Calcium, Ion: 1.18 mmol/L (ref 1.15–1.40)
Calcium, Ion: 1.19 mmol/L (ref 1.15–1.40)
Calcium, Ion: 1.2 mmol/L (ref 1.15–1.40)
Calcium, Ion: 1.21 mmol/L (ref 1.15–1.40)
HCT: 24 % — ABNORMAL LOW (ref 36.0–46.0)
HCT: 25 % — ABNORMAL LOW (ref 36.0–46.0)
HCT: 25 % — ABNORMAL LOW (ref 36.0–46.0)
HCT: 26 % — ABNORMAL LOW (ref 36.0–46.0)
HCT: 26 % — ABNORMAL LOW (ref 36.0–46.0)
HCT: 26 % — ABNORMAL LOW (ref 36.0–46.0)
Hemoglobin: 8.2 g/dL — ABNORMAL LOW (ref 12.0–15.0)
Hemoglobin: 8.5 g/dL — ABNORMAL LOW (ref 12.0–15.0)
Hemoglobin: 8.5 g/dL — ABNORMAL LOW (ref 12.0–15.0)
Hemoglobin: 8.8 g/dL — ABNORMAL LOW (ref 12.0–15.0)
Hemoglobin: 8.8 g/dL — ABNORMAL LOW (ref 12.0–15.0)
Hemoglobin: 8.8 g/dL — ABNORMAL LOW (ref 12.0–15.0)
O2 Saturation: 100 %
O2 Saturation: 100 %
O2 Saturation: 100 %
O2 Saturation: 72 %
O2 Saturation: 89 %
O2 Saturation: 96 %
Patient temperature: 36.5
Patient temperature: 36.8
Patient temperature: 36.8
Patient temperature: 37.3
Patient temperature: 38.1
Patient temperature: 98.1
Potassium: 3.4 mmol/L — ABNORMAL LOW (ref 3.5–5.1)
Potassium: 3.7 mmol/L (ref 3.5–5.1)
Potassium: 3.7 mmol/L (ref 3.5–5.1)
Potassium: 3.8 mmol/L (ref 3.5–5.1)
Potassium: 3.8 mmol/L (ref 3.5–5.1)
Potassium: 3.8 mmol/L (ref 3.5–5.1)
Sodium: 140 mmol/L (ref 135–145)
Sodium: 142 mmol/L (ref 135–145)
Sodium: 143 mmol/L (ref 135–145)
Sodium: 143 mmol/L (ref 135–145)
Sodium: 143 mmol/L (ref 135–145)
Sodium: 144 mmol/L (ref 135–145)
TCO2: 28 mmol/L (ref 22–32)
TCO2: 28 mmol/L (ref 22–32)
TCO2: 28 mmol/L (ref 22–32)
TCO2: 28 mmol/L (ref 22–32)
TCO2: 29 mmol/L (ref 22–32)
TCO2: 31 mmol/L (ref 22–32)
pCO2 arterial: 33.7 mmHg (ref 32–48)
pCO2 arterial: 34.5 mmHg (ref 32–48)
pCO2 arterial: 36 mmHg (ref 32–48)
pCO2 arterial: 38 mmHg (ref 32–48)
pCO2 arterial: 39.2 mmHg (ref 32–48)
pCO2 arterial: 40.5 mmHg (ref 32–48)
pH, Arterial: 7.432 (ref 7.35–7.45)
pH, Arterial: 7.463 — ABNORMAL HIGH (ref 7.35–7.45)
pH, Arterial: 7.485 — ABNORMAL HIGH (ref 7.35–7.45)
pH, Arterial: 7.495 — ABNORMAL HIGH (ref 7.35–7.45)
pH, Arterial: 7.507 — ABNORMAL HIGH (ref 7.35–7.45)
pH, Arterial: 7.52 — ABNORMAL HIGH (ref 7.35–7.45)
pO2, Arterial: 206 mmHg — ABNORMAL HIGH (ref 83–108)
pO2, Arterial: 238 mmHg — ABNORMAL HIGH (ref 83–108)
pO2, Arterial: 270 mmHg — ABNORMAL HIGH (ref 83–108)
pO2, Arterial: 37 mmHg — CL (ref 83–108)
pO2, Arterial: 52 mmHg — ABNORMAL LOW (ref 83–108)
pO2, Arterial: 74 mmHg — ABNORMAL LOW (ref 83–108)

## 2022-09-06 LAB — URINE CULTURE
Culture: >=100000 — AB
Special Requests: NORMAL

## 2022-09-06 LAB — APTT
aPTT: 28 seconds (ref 24–36)
aPTT: 26 seconds (ref 24–36)

## 2022-09-06 LAB — PHOSPHORUS
Phosphorus: 3.7 mg/dL (ref 2.5–4.6)
Phosphorus: 4 mg/dL (ref 2.5–4.6)

## 2022-09-06 LAB — PROTIME-INR
INR: 1 (ref 0.8–1.2)
INR: 1 (ref 0.8–1.2)
Prothrombin Time: 12.7 seconds (ref 11.4–15.2)
Prothrombin Time: 13.2 seconds (ref 11.4–15.2)

## 2022-09-06 LAB — URINALYSIS, ROUTINE W REFLEX MICROSCOPIC
Bilirubin Urine: NEGATIVE
Glucose, UA: NEGATIVE mg/dL
Hgb urine dipstick: NEGATIVE
Ketones, ur: NEGATIVE mg/dL
Nitrite: NEGATIVE
Protein, ur: NEGATIVE mg/dL
Specific Gravity, Urine: 1.017 (ref 1.005–1.030)
pH: 6 (ref 5.0–8.0)

## 2022-09-06 LAB — TRIGLYCERIDES: Triglycerides: 565 mg/dL — ABNORMAL HIGH (ref <150)

## 2022-09-06 LAB — GLUCOSE, CAPILLARY
Glucose-Capillary: 108 mg/dL — ABNORMAL HIGH (ref 70–99)
Glucose-Capillary: 139 mg/dL — ABNORMAL HIGH (ref 70–99)
Glucose-Capillary: 153 mg/dL — ABNORMAL HIGH (ref 70–99)
Glucose-Capillary: 168 mg/dL — ABNORMAL HIGH (ref 70–99)
Glucose-Capillary: 193 mg/dL — ABNORMAL HIGH (ref 70–99)
Glucose-Capillary: 202 mg/dL — ABNORMAL HIGH (ref 70–99)
Glucose-Capillary: 286 mg/dL — ABNORMAL HIGH (ref 70–99)
Glucose-Capillary: 75 mg/dL (ref 70–99)
Glucose-Capillary: 92 mg/dL (ref 70–99)

## 2022-09-06 LAB — SARS CORONAVIRUS 2 BY RT PCR: SARS Coronavirus 2 by RT PCR: NEGATIVE

## 2022-09-06 LAB — BILIRUBIN, DIRECT: Bilirubin, Direct: 0.1 mg/dL (ref 0.0–0.2)

## 2022-09-06 LAB — MAGNESIUM
Magnesium: 1.9 mg/dL (ref 1.7–2.4)
Magnesium: 2 mg/dL (ref 1.7–2.4)

## 2022-09-06 MED ORDER — SODIUM CHLORIDE 0.9 % IV SOLN
1.5000 g | Freq: Four times a day (QID) | INTRAVENOUS | Status: DC
  Administered 2022-09-07 (×3): 1.5 g via INTRAVENOUS
  Filled 2022-09-06 (×6): qty 4

## 2022-09-06 MED ORDER — MIDAZOLAM-SODIUM CHLORIDE 100-0.9 MG/100ML-% IV SOLN
1.0000 mg/h | INTRAVENOUS | Status: DC
  Administered 2022-09-06: 7 mg/h via INTRAVENOUS
  Administered 2022-09-07: 10 mg/h via INTRAVENOUS
  Administered 2022-09-07: 20 mg/h via INTRAVENOUS
  Filled 2022-09-06 (×3): qty 100

## 2022-09-06 MED ORDER — SODIUM CHLORIDE 0.9 % IV SOLN
1.5000 g | Freq: Four times a day (QID) | INTRAVENOUS | Status: DC
  Administered 2022-09-06: 1.5 g via INTRAVENOUS
  Filled 2022-09-06 (×4): qty 4

## 2022-09-06 MED ORDER — DEXTROSE 5 % IV SOLN: INTRAVENOUS | Status: DC

## 2022-09-06 NOTE — Progress Notes (Signed)
CPT performed per physician order. Pt tolerated x 2 minutes before desaturating to 85%. CPT stopped at this time. SpO2 recovered to 94%. RT will continue to monitor and be available as needed.

## 2022-09-06 NOTE — Progress Notes (Signed)
Okay to discontinue CPT per Dr. Carlis Abbott due to desaturations. RT will continue to monitor and be available as needed.

## 2022-09-06 NOTE — Progress Notes (Signed)
NAME:  Kaylee Murphy, MRN:  [id number], DOB:  13-Jul-1961, LOS: 22 ADMISSION DATE:  08/25/2022, CONSULTATION DATE:  09/06/22 REFERRING MD:  Tegeler, CHIEF COMPLAINT:  cardiac arrest   History of Present Illness:  Kaylee Murphy is a 61 y.o. 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  History taken from Epic and report as no family is available; apparently she had seizure-like activity and EMS was called.  Pt was awake, but altered on arrival and then had a syncopal episode with loss of pulses.  Initial rhythm was Vfib. She underwent approximately 50 minutes of CPR with a run of Torsades and was intubated by EMS.   In the ED, ETT changed without sedation, then patient began moving and had purposeful reaching for tube.   At the time of PCCM consult, no labs or head CT available. She initially required an epinephrine gtt, this was weaned off.  EKG without STEMI and Cardiology was also consulted.  Pertinent  Medical History   has a past medical history of Anemia, Anxiety, Chronic back pain, Chronic shoulder pain (10 years), Depression, Diabetes mellitus, Hyperlipidemia, and Hypertension.   Significant Hospital Events: Including procedures, antibiotic start and stop dates in addition to other pertinent events   12/14 witnessed out of hospital vfib arrest, 50 mins CPR 12/17 cont epileptogenicity on EEG.  12/18 weaned off sedation 12/19 night had another sz, put back on versed prop. DNR after family mtg, with plan for comfort in 2d if not significantly improved   12/21 off cEEG, off NE, ongoing family discussions   Interim History / Subjective:  [name].  No family at bedside again today.  Increasing FiO2 requirements  Objective   Blood pressure 124/62, pulse 67, temperature 98.2 F (36.8 C), resp. rate (!) 21,  height '5\' 5"'$  (1.651 m), weight 73 kg, last menstrual period 02/13/2014, SpO2 100 %.    Vent Mode: PRVC FiO2 (%):  [50 %-100 %] 100 % Set Rate:  [22 bmp] 22 bmp Vt Set:  [440 mL] 440 mL PEEP:  [10 cmH20] 10 cmH20 Plateau Pressure:  [25 cmH20-30 cmH20] 25 cmH20   Intake/Output Summary (Last 24 hours) at 09/06/2022 2029 Last data filed at 09/06/2022 1800 Gross per 24 hour  Intake 2612.78 ml  Output 1755 ml  Net 857.78 ml    Filed Weights   09/04/22 0500 09/05/22 0300 09/06/22 0457  Weight: 66.4 kg 69.8 kg 73 kg   General: Critically ill-appearing woman lying in bed no acute distress, examined on propofol and Versed infusions. HEENT: [place]/AT, eyes anicteric, endotracheal tube in place Neuro: Nurse also verbal stimulation or doing routine part of the exam.  Pupils reactive.  Intact doll's eyes, no corneals.  Blinks more during stimulation.  No pharyngeal gag, intact cough reflex.  Not withdrawing from pain in any extremity. CV: S1-S2, regular rate and rhythm PULM: Synchronous with vent, minimal secretions GI: Soft, nontender Extremities: No significant swelling, no cyanosis  Labs reviewed.   Resolved Hospital Problem list     Assessment & Plan:   OOH Vfib arrest Acute hypoxic respiratory failure requiring MV Myoclonic seizures Anoxic brain injury  NSTEMI Ectopy, prolonged QTc  Acute HFrEF Hypotension  Fever, suspect central Leukocytosis, suspect reactive in setting of sz  DM2 Inadequate PO intake Elevated transaminases Elevated triglycerides on  propofol  Goals of care DNR status Severe hypertriglyceridemia due to propofol> improved (suspect this was due to drawing upstream from the line. Hypokalemia Anemia -with seizure recurrence during sedation holiday 12/18-19 night, prognosis for meaningful recovery is poor P -[name]--we will continue to care for her as they prepare for the operating room tomorrow for harvest. -Vent at 100% to maintain oxygen.  Had  desaturation today when sedation was lightened and had increased myoclonus with dyssynchrony -Lab monitoring per [facility] -Still needs propofol and Versed infusions.  Increased Versed ceiling to 20 mg/h given ongoing myoclonus -Continue insulin with goal blood glucose 100 4180   Best Practice (right click and "Reselect all SmartList Selections" daily)   Diet/type: TF on hold DVT prophylaxis:  Lovenox once daily GI prophylaxis: PPI Lines: Central line- PICC Foley:  Yes, and it is still needed Code Status:  full code Last date of multidisciplinary goals of care discussion [see previous IPAL discussions 12/22  Julian Hy, DO 09/06/22 8:33 PM [facility]  For contact information, see Amion. If no response to pager, please call PCCM consult pager. After hours, 7PM- 7AM, please call Elink.

## 2022-09-07 ENCOUNTER — Encounter (HOSPITAL_COMMUNITY): Payer: Medicare Other | Admitting: Certified Registered Nurse Anesthetist

## 2022-09-07 ENCOUNTER — Encounter (HOSPITAL_COMMUNITY): Admission: EM | Disposition: E | Payer: Self-pay | Source: Home / Self Care | Attending: Critical Care Medicine

## 2022-09-07 ENCOUNTER — Other Ambulatory Visit: Payer: Self-pay

## 2022-09-07 HISTORY — PX: ORGAN PROCUREMENT: SHX5270

## 2022-09-07 LAB — COMPREHENSIVE METABOLIC PANEL
ALT: 29 U/L (ref 0–44)
ALT: 29 U/L (ref 0–44)
ALT: 23 U/L (ref 0–44)
AST: 43 U/L — ABNORMAL HIGH (ref 15–41)
AST: 45 U/L — ABNORMAL HIGH (ref 15–41)
AST: 43 U/L — ABNORMAL HIGH (ref 15–41)
Albumin: 1.9 g/dL — ABNORMAL LOW (ref 3.5–5.0)
Albumin: 1.9 g/dL — ABNORMAL LOW (ref 3.5–5.0)
Albumin: 1.8 g/dL — ABNORMAL LOW (ref 3.5–5.0)
Alkaline Phosphatase: 88 U/L (ref 38–126)
Alkaline Phosphatase: 84 U/L (ref 38–126)
Alkaline Phosphatase: 89 U/L (ref 38–126)
Anion gap: 6 (ref 5–15)
Anion gap: 6 (ref 5–15)
Anion gap: 6 (ref 5–15)
BUN: 10 mg/dL (ref 6–20)
BUN: 10 mg/dL (ref 6–20)
BUN: 11 mg/dL (ref 6–20)
CO2: 27 mmol/L (ref 22–32)
CO2: 26 mmol/L (ref 22–32)
CO2: 25 mmol/L (ref 22–32)
Calcium: 8.4 mg/dL — ABNORMAL LOW (ref 8.9–10.3)
Calcium: 8.3 mg/dL — ABNORMAL LOW (ref 8.9–10.3)
Calcium: 7.9 mg/dL — ABNORMAL LOW (ref 8.9–10.3)
Chloride: 109 mmol/L (ref 98–111)
Chloride: 108 mmol/L (ref 98–111)
Chloride: 109 mmol/L (ref 98–111)
Creatinine, Ser: 0.53 mg/dL (ref 0.44–1.00)
Creatinine, Ser: 0.54 mg/dL (ref 0.44–1.00)
Creatinine, Ser: 0.46 mg/dL (ref 0.44–1.00)
GFR, Estimated: >60 mL/min (ref >60)
GFR, Estimated: >60 mL/min (ref >60)
GFR, Estimated: >60 mL/min (ref >60)
Glucose, Bld: 107 mg/dL — ABNORMAL HIGH (ref 70–99)
Glucose, Bld: 133 mg/dL — ABNORMAL HIGH (ref 70–99)
Glucose, Bld: 106 mg/dL — ABNORMAL HIGH (ref 70–99)
Potassium: 3.4 mmol/L — ABNORMAL LOW (ref 3.5–5.1)
Potassium: 3.9 mmol/L (ref 3.5–5.1)
Potassium: 3.7 mmol/L (ref 3.5–5.1)
Sodium: 142 mmol/L (ref 135–145)
Sodium: 140 mmol/L (ref 135–145)
Sodium: 140 mmol/L (ref 135–145)
Total Bilirubin: <0.1 mg/dL — ABNORMAL LOW (ref 0.3–1.2)
Total Bilirubin: 0.4 mg/dL (ref 0.3–1.2)
Total Bilirubin: 0.2 mg/dL — ABNORMAL LOW (ref 0.3–1.2)
Total Protein: 6 g/dL — ABNORMAL LOW (ref 6.5–8.1)
Total Protein: 6 g/dL — ABNORMAL LOW (ref 6.5–8.1)
Total Protein: 5.5 g/dL — ABNORMAL LOW (ref 6.5–8.1)

## 2022-09-07 LAB — POCT I-STAT 7, (LYTES, BLD GAS, ICA,H+H)
Acid-Base Excess: 4 mmol/L — ABNORMAL HIGH (ref 0.0–2.0)
Acid-Base Excess: 4 mmol/L — ABNORMAL HIGH (ref 0.0–2.0)
Bicarbonate: 26.9 mmol/L (ref 20.0–28.0)
Bicarbonate: 26.4 mmol/L (ref 20.0–28.0)
Calcium, Ion: 1.17 mmol/L (ref 1.15–1.40)
Calcium, Ion: 1.08 mmol/L — ABNORMAL LOW (ref 1.15–1.40)
HCT: 25 % — ABNORMAL LOW (ref 36.0–46.0)
HCT: 22 % — ABNORMAL LOW (ref 36.0–46.0)
Hemoglobin: 8.5 g/dL — ABNORMAL LOW (ref 12.0–15.0)
Hemoglobin: 7.5 g/dL — ABNORMAL LOW (ref 12.0–15.0)
O2 Saturation: 100 %
O2 Saturation: 100 %
Patient temperature: 37
Potassium: 3.8 mmol/L (ref 3.5–5.1)
Potassium: 3.6 mmol/L (ref 3.5–5.1)
Sodium: 142 mmol/L (ref 135–145)
Sodium: 141 mmol/L (ref 135–145)
TCO2: 28 mmol/L (ref 22–32)
TCO2: 27 mmol/L (ref 22–32)
pCO2 arterial: 32.1 mmHg (ref 32–48)
pCO2 arterial: 28.2 mmHg — ABNORMAL LOW (ref 32–48)
pH, Arterial: 7.531 — ABNORMAL HIGH (ref 7.35–7.45)
pH, Arterial: 7.58 — ABNORMAL HIGH (ref 7.35–7.45)
pO2, Arterial: 281 mmHg — ABNORMAL HIGH (ref 83–108)
pO2, Arterial: 189 mmHg — ABNORMAL HIGH (ref 83–108)

## 2022-09-07 LAB — CBC WITH DIFFERENTIAL/PLATELET
Abs Immature Granulocytes: 0.36 10*3/uL — ABNORMAL HIGH (ref 0.00–0.07)
Abs Immature Granulocytes: 0.4 10*3/uL — ABNORMAL HIGH (ref 0.00–0.07)
Basophils Absolute: 0.1 10*3/uL (ref 0.0–0.1)
Basophils Absolute: 0.1 10*3/uL (ref 0.0–0.1)
Basophils Relative: 1 %
Basophils Relative: 1 %
Eosinophils Absolute: 0.9 10*3/uL — ABNORMAL HIGH (ref 0.0–0.5)
Eosinophils Absolute: 0.9 10*3/uL — ABNORMAL HIGH (ref 0.0–0.5)
Eosinophils Relative: 5 %
Eosinophils Relative: 6 %
HCT: 24.9 % — ABNORMAL LOW (ref 36.0–46.0)
HCT: 24.4 % — ABNORMAL LOW (ref 36.0–46.0)
Hemoglobin: 8.1 g/dL — ABNORMAL LOW (ref 12.0–15.0)
Hemoglobin: 7.5 g/dL — ABNORMAL LOW (ref 12.0–15.0)
Immature Granulocytes: 2 %
Immature Granulocytes: 3 %
Lymphocytes Relative: 28 %
Lymphocytes Relative: 29 %
Lymphs Abs: 4.8 10*3/uL — ABNORMAL HIGH (ref 0.7–4.0)
Lymphs Abs: 4.4 10*3/uL — ABNORMAL HIGH (ref 0.7–4.0)
MCH: 26.8 pg (ref 26.0–34.0)
MCH: 26.3 pg (ref 26.0–34.0)
MCHC: 32.5 g/dL (ref 30.0–36.0)
MCHC: 30.7 g/dL (ref 30.0–36.0)
MCV: 82.5 fL (ref 80.0–100.0)
MCV: 85.6 fL (ref 80.0–100.0)
Monocytes Absolute: 1.2 10*3/uL — ABNORMAL HIGH (ref 0.1–1.0)
Monocytes Absolute: 1.2 10*3/uL — ABNORMAL HIGH (ref 0.1–1.0)
Monocytes Relative: 7 %
Monocytes Relative: 8 %
Neutro Abs: 9.8 10*3/uL — ABNORMAL HIGH (ref 1.7–7.7)
Neutro Abs: 8.4 10*3/uL — ABNORMAL HIGH (ref 1.7–7.7)
Neutrophils Relative %: 57 %
Neutrophils Relative %: 53 %
Platelets: 391 10*3/uL (ref 150–400)
Platelets: 387 10*3/uL (ref 150–400)
RBC: 3.02 MIL/uL — ABNORMAL LOW (ref 3.87–5.11)
RBC: 2.85 MIL/uL — ABNORMAL LOW (ref 3.87–5.11)
RDW: 15.5 % (ref 11.5–15.5)
RDW: 15.7 % — ABNORMAL HIGH (ref 11.5–15.5)
WBC: 17.2 10*3/uL — ABNORMAL HIGH (ref 4.0–10.5)
WBC: 15.4 10*3/uL — ABNORMAL HIGH (ref 4.0–10.5)
nRBC: 0 % (ref 0.0–0.2)
nRBC: 0 % (ref 0.0–0.2)

## 2022-09-07 LAB — MAGNESIUM
Magnesium: 1.9 mg/dL (ref 1.7–2.4)
Magnesium: 2.3 mg/dL (ref 1.7–2.4)

## 2022-09-07 LAB — GLUCOSE, CAPILLARY
Glucose-Capillary: 117 mg/dL — ABNORMAL HIGH (ref 70–99)
Glucose-Capillary: 103 mg/dL — ABNORMAL HIGH (ref 70–99)
Glucose-Capillary: 122 mg/dL — ABNORMAL HIGH (ref 70–99)
Glucose-Capillary: 126 mg/dL — ABNORMAL HIGH (ref 70–99)

## 2022-09-07 LAB — APTT
aPTT: 32 seconds (ref 24–36)
aPTT: 29 seconds (ref 24–36)

## 2022-09-07 LAB — URINALYSIS, ROUTINE W REFLEX MICROSCOPIC
Bilirubin Urine: NEGATIVE
Glucose, UA: NEGATIVE mg/dL
Hgb urine dipstick: NEGATIVE
Ketones, ur: NEGATIVE mg/dL
Nitrite: NEGATIVE
Protein, ur: NEGATIVE mg/dL
Specific Gravity, Urine: 1.009 (ref 1.005–1.030)
pH: 6 (ref 5.0–8.0)

## 2022-09-07 LAB — PHOSPHORUS
Phosphorus: 4 mg/dL (ref 2.5–4.6)
Phosphorus: 3.8 mg/dL (ref 2.5–4.6)

## 2022-09-07 LAB — PROTIME-INR
INR: 1 (ref 0.8–1.2)
INR: 1.1 (ref 0.8–1.2)
Prothrombin Time: 13.2 seconds (ref 11.4–15.2)
Prothrombin Time: 13.7 seconds (ref 11.4–15.2)

## 2022-09-07 LAB — TRIGLYCERIDES: Triglycerides: 116 mg/dL (ref <150)

## 2022-09-07 SURGERY — SURGICAL PROCUREMENT, ORGAN
Anesthesia: General

## 2022-09-07 MED ORDER — HEPARIN SODIUM 10000 UNIT/ML FOR ORGAN DONATION
100.0000 [IU]/kg | INTRAMUSCULAR | Status: AC
  Administered 2022-09-07: 7300 [IU] via INTRAVENOUS
  Filled 2022-09-07: qty 0.73

## 2022-09-07 MED ORDER — HEPARIN SODIUM 10000 UNIT/ML FOR ORGAN DONATION
100.0000 [IU]/kg | INTRAMUSCULAR | Status: DC
  Filled 2022-09-07: qty 1

## 2022-09-07 MED ORDER — FENTANYL 2500MCG IN NS 250ML (10MCG/ML) PREMIX INFUSION
50.0000 ug/h | INTRAVENOUS | Status: DC
  Administered 2022-09-07: 50 ug/h via INTRAVENOUS
  Filled 2022-09-07: qty 250

## 2022-09-07 MED ORDER — FENTANYL CITRATE PF 50 MCG/ML IJ SOSY: 50.0000 ug | PREFILLED_SYRINGE | Freq: Once | INTRAMUSCULAR | Status: DC

## 2022-09-07 MED ORDER — 0.9 % SODIUM CHLORIDE (POUR BTL) OPTIME
TOPICAL | Status: DC | PRN
  Administered 2022-09-07 (×2): 6000 mL

## 2022-09-07 MED ORDER — POTASSIUM CHLORIDE 20 MEQ PO PACK
40.0000 meq | PACK | Freq: Once | ORAL | Status: AC
  Administered 2022-09-07: 40 meq
  Filled 2022-09-07: qty 2

## 2022-09-07 MED ORDER — MAGNESIUM SULFATE 2 GM/50ML IV SOLN
2.0000 g | Freq: Once | INTRAVENOUS | Status: AC
  Administered 2022-09-07: 2 g via INTRAVENOUS
  Filled 2022-09-07: qty 50

## 2022-09-07 MED ORDER — FENTANYL BOLUS VIA INFUSION: 50.0000 ug | INTRAVENOUS | Status: DC | PRN

## 2022-09-07 MED ORDER — POLYETHYLENE GLYCOL 3350 17 G PO PACK: 17.0000 g | PACK | Freq: Every day | ORAL | Status: DC

## 2022-09-07 SURGICAL SUPPLY — 33 items
APPLIER CLIP 11 MED OPEN (CLIP) ×1
APR CLP MED 11 20 MLT OPN (CLIP) ×1
BAG COUNTER SPONGE SURGICOUNT (BAG) ×1 IMPLANT
BAG SPNG CNTER NS LX DISP (BAG) ×1
BLADE SAW STERNAL (BLADE) ×1 IMPLANT
CLIP APPLIE 11 MED OPEN (CLIP) ×1 IMPLANT
CNTNR URN SCR LID CUP LEK RST (MISCELLANEOUS) ×1 IMPLANT
CONT SPEC 4OZ STRL OR WHT (MISCELLANEOUS) ×1
COVER SURGICAL LIGHT HANDLE (MISCELLANEOUS) ×1 IMPLANT
DRAPE SLUSH MACHINE 52X66 (DRAPES) ×1 IMPLANT
DRSG TELFA 3X8 NADH STRL (GAUZE/BANDAGES/DRESSINGS) ×1 IMPLANT
ELECT REM PT RETURN 9FT ADLT (ELECTROSURGICAL) ×2
ELECTRODE REM PT RTRN 9FT ADLT (ELECTROSURGICAL) ×2 IMPLANT
GOWN STRL REUS W/ TWL LRG LVL3 (GOWN DISPOSABLE) ×4 IMPLANT
GOWN STRL REUS W/ TWL XL LVL3 (GOWN DISPOSABLE) ×2 IMPLANT
GOWN STRL REUS W/TWL LRG LVL3 (GOWN DISPOSABLE) ×4
GOWN STRL REUS W/TWL XL LVL3 (GOWN DISPOSABLE) ×2
HANDLE SUCTION POOLE (INSTRUMENTS) IMPLANT
KIT POST MORTEM ADULT 36X90 (BAG) ×1 IMPLANT
KIT TURNOVER KIT B (KITS) ×1 IMPLANT
MANIFOLD NEPTUNE II (INSTRUMENTS) ×1 IMPLANT
PACK AORTA (CUSTOM PROCEDURE TRAY) ×1 IMPLANT
PAD ARMBOARD 7.5X6 YLW CONV (MISCELLANEOUS) ×2 IMPLANT
PENCIL BUTTON HOLSTER BLD 10FT (ELECTRODE) ×1 IMPLANT
SOL PREP POV-IOD 4OZ 10% (MISCELLANEOUS) ×2 IMPLANT
STAPLER VISISTAT 35W (STAPLE) ×1 IMPLANT
SUCTION POOLE HANDLE (INSTRUMENTS) ×1
SUT ETHILON 1 LR 30 (SUTURE) ×2 IMPLANT
SUT ETHILON 2 0 PSLX (SUTURE) IMPLANT
SUT ETHILON 2 LR (SUTURE) IMPLANT
SUT SILK 2 0SH CR/8 30 (SUTURE) IMPLANT
TUBE CONNECTING 12X1/4 (SUCTIONS) ×1 IMPLANT
YANKAUER SUCT BULB TIP NO VENT (SUCTIONS) ×1 IMPLANT

## 2022-09-08 ENCOUNTER — Encounter (HOSPITAL_COMMUNITY): Payer: Self-pay

## 2022-09-09 LAB — CULTURE, BLOOD (ROUTINE X 2): Culture: NO GROWTH

## 2022-09-10 LAB — SURGICAL PATHOLOGY

## 2022-09-13 NOTE — Death Summary Note (Signed)
DEATH SUMMARY   Patient Details  Name: Kaylee Murphy MRN: [id number] DOB: [birth year]  Admission/Discharge Information   Admit Date:  09/25/2022  Date of Death: Date of Death: Oct 07, 2022  Time of Death: Time of Death: 1829-12-23  Length of Stay: 2022/12/23  Referring Physician: Pcp, No   Reason(s) for Hospitalization  Cardiac arrest  Diagnoses  Preliminary cause of death:  Secondary Diagnoses (including complications and co-morbidities):  Principal Problem:   Cardiac arrest [facility]) Active Problems:   Torsades de pointes ([place])   Acute respiratory failure with hypoxia ([place])   Seizure ([place])   Acute HFrEF (heart failure with reduced ejection fraction) ([place])   Hypotension   Anoxic brain injury ([place])   Goals of care, counseling/discussion   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 Hypokalemia Anemia Organ donor   Brief Hospital Course (including significant findings, care, treatment, and services provided and events leading to death)  Kaylee Murphy is a 62 y.o. 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  History taken from Epic and report as no family is available; apparently she had seizure-like activity and EMS was called.  Pt was awake, but altered on arrival and then had a syncopal episode with loss of pulses.  Initial rhythm was Vfib. She underwent  approximately 50 minutes of CPR with a run of Torsades and was intubated by EMS.   In the ED, ETT changed without sedation, then patient began moving and had purposeful reaching for tube.   At the time of PCCM consult, no labs or head CT available. She initially required an epinephrine gtt, this was weaned off.  EKG without STEMI and Cardiology was also consulted.   During the hospitalization she was managed with antiepileptics under neurology's management for refractory myoclonic seizures. She remained refractory and required heavy sedation to control her myoclonus. She never regained consciousness due to the severity of her anoxic injury.   She received empiric antibiotics for pneumonia, TF to support her nutrition, and had electrolytes monitored and repleted as needed. Her hemodynamics were stabilized by vasopressors. She remained on MV throughout her admission. Despite maximal aggressive care, she failed to significantly improve. Her family did not want her to suffer and elected to compassionately withdraw aggressive support. She was a DCD organ donor.    Pertinent Labs and Studies  Significant Diagnostic Studies DG Abd Portable 1V  Result Date: 09/06/2022 CLINICAL DATA:  Encounter for orogastric (OG) tube placement EXAM: PORTABLE ABDOMEN - 1 VIEW COMPARISON:  CT 09/04/2022 FINDINGS: Orogastric tube tip and side port overlie the stomach. There is a significant colonic stool burden, as seen on recent CT. Relative paucity of small bowel gas. Dense left basilar consolidation, see separately dictated chest radiograph. IMPRESSION: Orogastric tube tip and side port overlie the stomach. Electronically Signed   By: Maurine Simmering M.D.   On: 09/06/2022 09:40   DG CHEST PORT 1 VIEW  Result Date:  09/06/2022 CLINICAL DATA:  Hypoxia EXAM: PORTABLE CHEST 1 VIEW COMPARISON:  Radiograph 09/05/2022 FINDINGS: Endotracheal tube overlies the trachea proximally 5.3 cm above the carina. Esophageal temperature probe  overlies the thoracic inlet. Nasogastric tube passes below the diaphragm, tip excluded by collimation. Unchanged cardiomediastinal silhouette. Unchanged dense left basilar consolidation and slight improvement in right basilar airspace disease. No evidence of pneumothorax. Bones are unchanged. IMPRESSION: Unchanged dense left basilar consolidation and slight improvement in right basilar airspace disease. Electronically Signed   By: Maurine Simmering M.D.   On: 09/06/2022 09:35   DG CHEST PORT 1 VIEW  Result Date: 09/05/2022 CLINICAL DATA:  Pneumonia EXAM: PORTABLE CHEST 1 VIEW COMPARISON:  Radiograph 09/04/2022 FINDINGS: Endotracheal tube tip overlies the midthoracic trachea approximately 4.1 cm above the carina. Orogastric tube passes below the diaphragm, tip excluded by collimation. Unchanged enlarged cardiac silhouette. Unchanged bibasilar airspace disease. No evidence of pneumothorax. Bones are unchanged. IMPRESSION: Unchanged bibasilar airspace disease. Electronically Signed   By: Maurine Simmering M.D.   On: 09/05/2022 09:49   CT CHEST ABDOMEN PELVIS WO CONTRAST  Result Date: 09/04/2022 CLINICAL DATA:  Organ donation evaluation EXAM: CT CHEST, ABDOMEN AND PELVIS WITHOUT CONTRAST TECHNIQUE: Multidetector CT imaging of the chest, abdomen and pelvis was performed following the standard protocol without IV contrast. RADIATION DOSE REDUCTION: This exam was performed according to the departmental dose-optimization program which includes automated exposure control, adjustment of the mA and/or kV according to patient size and/or use of iterative reconstruction technique. COMPARISON:  None Available. FINDINGS: CT CHEST FINDINGS Cardiovascular: Extensive multi-vessel coronary artery calcification. Global cardiac size is at the upper limits of normal. No pericardial effusion. Central pulmonary arteries are enlarged in keeping with changes of pulmonary arterial hypertension. Mild atherosclerotic calcification within the  thoracic aorta. No aortic aneurysm. Left upper extremity PICC line tip is seen with its tip within the terminal right innominate vein. Mediastinum/Nodes: Visualized thyroid is unremarkable. No pathologic thoracic adenopathy. Endotracheal tube seen 3.5 cm above the carina. Nasogastric tube extends into the proximal body of the stomach. Esophagus unremarkable. Lungs/Pleura: Mild emphysema. Collapse and consolidation of the lower lobes bilaterally. Patchy ground-glass infiltrate within the a residual residual aerated lower lobes bilaterally may be infectious or inflammatory in nature. No pneumothorax or pleural effusion. There is an abrupt cut off of the left lower lobar pulmonary bronchus suggesting an endobronchial lesion such as a mucous plug. Musculoskeletal: No acute bone abnormality. No lytic or blastic bone lesion. CT ABDOMEN PELVIS FINDINGS Hepatobiliary: No focal liver abnormality is seen. No gallstones, gallbladder wall thickening, or biliary dilatation. Pancreas: Unremarkable Spleen: Unremarkable Adrenals/Urinary Tract: The adrenal glands are unremarkable. The kidneys are normal in size position. Numerous calcifications are noted within the renal hila bilaterally. While several of these represent vascular calcifications, 1-2 mm nonobstructing urinary calculi are suspected within the lower pole of the kidneys bilaterally. No hydronephrosis. No ureteral calculi. No perinephric fluid collections. The bladder is decompressed with a Foley catheter balloon seen within its lumen. Stomach/Bowel: Moderate colonic stool burden. There is a relatively short transition to decompressed large bowel within the splenic flexure best seen on axial image # 60/2 and an underlying stricture or annular lesion is difficult to exclude. Caliber change of proximal colonic stool burden suggests a resultant partial large bowel obstruction. Moderate stool within the rectal vault. Mild presacral edema is nonspecific and can be seen the  setting of fecal impaction or mild anasarca. The stomach, small bowel, and large bowel are otherwise unremarkable. Appendix normal. No free  intraperitoneal gas or fluid. Vascular/Lymphatic: Moderate aortoiliac atherosclerotic calcification. No aortic aneurysm. No pathologic adenopathy within the abdomen and pelvis. Reproductive: Uterus and bilateral adnexa are unremarkable. Other: No abdominal wall hernia. Mild diffuse subcutaneous edema in keeping with mild anasarca. Musculoskeletal: Osseous structures are age-appropriate. No lytic or blastic bone lesion. No acute bone abnormality. IMPRESSION: 1. Extensive multi-vessel coronary artery calcification. 2. Morphologic changes in keeping with pulmonary arterial hypertension. 3. Mild emphysema. 4. Collapse and consolidation of the lower lobes bilaterally. Patchy ground-glass infiltrate within the residual aerated lower lobes bilaterally may be infectious or inflammatory in nature. 5. Abrupt cut off of the left lower lobar pulmonary bronchus suggesting an endobronchial lesion such as a mucous plug. 6. Caliber transition within the splenic flexure of the colon suggesting underlying obstructing lesion such as a stricture, though and annular mass is not excluded on this limited examination. Correlation with endoscopy would be helpful for further evaluation. Resultant partial large bowel obstruction with moderate colonic stool burden. 7. Moderate stool within the rectal vault. Mild presacral edema is nonspecific and can be seen the setting of fecal impaction or mild anasarca. 8. Minimal bilateral nonobstructing nephrolithiasis. Aortic Atherosclerosis (ICD10-I70.0) and Emphysema (ICD10-J43.9). Electronically Signed   By: Fidela Salisbury M.D.   On: 09/04/2022 22:11   DG Chest Port 1 View  Result Date: 09/04/2022 CLINICAL DATA:  Anoxic brain damage.  Organ donation. EXAM: PORTABLE CHEST 1 VIEW COMPARISON:  Chest radiograph 09/01/2022 and earlier FINDINGS: Endotracheal tube  tip projects 4.6 cm above the carina. Enteric tube tip and side hole are below the diaphragm though the tip is not included on the image. Stable cardiomegaly. Aortic calcifications. Increased retrocardiac left lower lobe opacity. New small left pleural effusion. Diffuse hazy interstitial thickening. Subsegmental atelectasis in the right lower lobe. No pneumothorax. IMPRESSION: 1. New small left pleural effusion. 2. Increased density of the retrocardiac opacity in the left lower lobe may reflect combination of pleural effusion and associated atelectasis, though superimposed infection is not excluded. 3. Stable cardiomegaly with mild interstitial edema. Electronically Signed   By: Ileana Roup M.D.   On: 09/04/2022 15:11   DG CHEST PORT 1 VIEW  Result Date: 09/01/2022 CLINICAL DATA:  Endotracheal tube, cardiac arrest EXAM: PORTABLE CHEST 1 VIEW COMPARISON:  09/03/2022 FINDINGS: Slight retraction of endotracheal tube, tip positioned approximately 2 cm above the carina. Esophagogastric tube is positioned with tip and side port below the diaphragm. Cardiomegaly with retrocardiac atelectasis or consolidation and mild diffuse interstitial pulmonary opacity, similar to prior examination. No new airspace opacity. Osseous structures unremarkable. IMPRESSION: 1. Slight retraction of endotracheal tube, tip positioned approximately 2 cm above the carina. 2. Esophagogastric tube is positioned with tip and side port below the diaphragm. 3. Cardiomegaly with retrocardiac atelectasis or consolidation and mild diffuse interstitial pulmonary opacity, similar to prior examination. Electronically Signed   By: Delanna Ahmadi M.D.   On: 09/01/2022 08:16   MR BRAIN WO CONTRAST  Result Date: 08/28/2022 CLINICAL DATA:  Anoxic brain injury EXAM: MRI HEAD WITHOUT CONTRAST TECHNIQUE: Multiplanar, multiecho pulse sequences of the brain and surrounding structures were obtained without intravenous contrast. COMPARISON:  None Available.  FINDINGS: Brain: No acute infarct, mass effect or extra-axial collection. No acute or chronic hemorrhage. Minimal multifocal hyperintense T2-weight signal within the white matter. A partially empty sella is incidentally noted. Vascular: Major flow voids are preserved. Skull and upper cervical spine: Normal calvarium and skull base. Visualized upper cervical spine and soft tissues are normal. Sinuses/Orbits:No paranasal sinus fluid levels  or advanced mucosal thickening. No mastoid or middle ear effusion. Normal orbits. IMPRESSION: 1. No acute intracranial abnormality. 2. Minimal multifocal hyperintense T2-weight signal within the white matter, nonspecific but may be seen in the setting of chronic small vessel ischemia. Electronically Signed   By: Ulyses Jarred M.D.   On: 08/28/2022 00:17   ECHOCARDIOGRAM COMPLETE  Result Date: 08/27/2022    ECHOCARDIOGRAM REPORT   Patient Name:   EMMANUELLA MIRANTE Date of Exam: 08/27/2022 Medical Rec #:  [id number]       Height:       65.0 in Accession #:    [id number]      Weight:       141.5 lb Date of Birth:  1961-04-18       BSA:          1.708 m Patient Age:    89 years        BP:           125/76 mmHg Patient Gender: F               HR:           61 bpm. Exam Location:  Inpatient Procedure: 2D Echo, Color Doppler and Cardiac Doppler Indications:    cardiac arrest  History:        Patient has prior history of Echocardiogram examinations, most                 recent 02/20/2022. Risk Factors:Diabetes, Hypertension,                 Dyslipidemia and Current Smoker.  Sonographer:    Johny Chess RDCS Referring Phys: [id number] Francesca Jewett  Sonographer Comments: Echo performed with patient supine and on artificial respirator. IMPRESSIONS  1. Left ventricular ejection fraction, by estimation, is 30 to 35%. The left ventricle has moderately decreased function. The left ventricle demonstrates global hypokinesis. There is mild left ventricular hypertrophy. Left ventricular  diastolic parameters are indeterminate.  2. Right ventricular systolic function is normal. The right ventricular size is normal. Tricuspid regurgitation signal is inadequate for assessing PA pressure.  3. The mitral valve is normal in structure. No evidence of mitral valve regurgitation. No evidence of mitral stenosis.  4. The aortic valve is tricuspid. Aortic valve regurgitation is not visualized. Aortic valve sclerosis/calcification is present, without any evidence of aortic stenosis. FINDINGS  Left Ventricle: Left ventricular ejection fraction, by estimation, is 30 to 35%. The left ventricle has moderately decreased function. The left ventricle demonstrates global hypokinesis. The left ventricular internal cavity size was normal in size. There is mild left ventricular hypertrophy. Left ventricular diastolic parameters are indeterminate. Right Ventricle: The right ventricular size is normal. No increase in right ventricular wall thickness. Right ventricular systolic function is normal. Tricuspid regurgitation signal is inadequate for assessing PA pressure. Left Atrium: Left atrial size was normal in size. Right Atrium: Right atrial size was normal in size. Pericardium: Trivial pericardial effusion is present. Mitral Valve: The mitral valve is normal in structure. No evidence of mitral valve regurgitation. No evidence of mitral valve stenosis. Tricuspid Valve: The tricuspid valve is normal in structure. Tricuspid valve regurgitation is trivial. Aortic Valve: The aortic valve is tricuspid. Aortic valve regurgitation is not visualized. Aortic valve sclerosis/calcification is present, without any evidence of aortic stenosis. Pulmonic Valve: The pulmonic valve was not well visualized. Pulmonic valve regurgitation is trivial. Aorta: The aortic root and ascending aorta are structurally normal, with no evidence of dilitation. IAS/Shunts:  The interatrial septum was not well visualized.  LEFT VENTRICLE PLAX 2D LVIDd:          4.70 cm     Diastology LVIDs:         4.00 cm     LV e' medial:    4.35 cm/s LV PW:         1.10 cm     LV E/e' medial:  14.6 LV IVS:        1.10 cm     LV e' lateral:   5.33 cm/s LVOT diam:     2.20 cm     LV E/e' lateral: 11.9 LV SV:         51 LV SV Index:   30 LVOT Area:     3.80 cm  LV Volumes (MOD) LV vol d, MOD A4C: 98.5 ml LV vol s, MOD A4C: 72.6 ml LV SV MOD A4C:     98.5 ml RIGHT VENTRICLE             IVC RV Basal diam:  2.40 cm     IVC diam: 1.70 cm RV S prime:     12.60 cm/s TAPSE (M-mode): 1.2 cm LEFT ATRIUM             Index        RIGHT ATRIUM           Index LA diam:        3.20 cm 1.87 cm/m   RA Area:     15.20 cm LA Vol (A2C):   46.2 ml 27.05 ml/m  RA Volume:   36.60 ml  21.43 ml/m LA Vol (A4C):   54.9 ml 32.15 ml/m LA Biplane Vol: 54.5 ml 31.91 ml/m  AORTIC VALVE LVOT Vmax:   67.00 cm/s LVOT Vmean:  40.600 cm/s LVOT VTI:    0.134 m  AORTA Ao Root diam: 3.00 cm Ao Asc diam:  3.10 cm MITRAL VALVE MV Area (PHT): 2.71 cm    SHUNTS MV Decel Time: 280 msec    Systemic VTI:  0.13 m MV E velocity: 63.60 cm/s  Systemic Diam: 2.20 cm MV A velocity: 56.80 cm/s MV E/A ratio:  1.12 Oswaldo Milian MD Electronically signed by Oswaldo Milian MD Signature Date/Time: 08/27/2022/10:56:17 AM    Final    EEG adult  Result Date: 08/27/2022 Derek Jack, MD     08/27/2022 10:02 AM Routine EEG Report Aleane Wesenberg is a 62 y.o. female with a history of cardiac arrest who is undergoing an EEG to evaluate for seizures. Report: This EEG was acquired with electrodes placed according to the International 10-20 electrode system (including Fp1, Fp2, F3, F4, C3, C4, P3, P4, O1, O2, T3, T4, T5, T6, A1, A2, Fz, Cz, Pz). The following electrodes were missing or displaced: none. The background consisted of a burst suppression pattern as follows: 1-2 polyspike and wave discharges alternating with 3-5 seconds of diffuse suppression. The discharges were timelocked to full body myoclonic jerks reviewed by  video. This activity was not clearly reactive to stimulation. There were no other seizure types noted in addition to the myoclonic seizures described above. Photic stimulation and hyperventilation were not performed. Impression and clinical correlation: This EEG was obtained while comatose and sedated on versed and fentanyl and is abnormal due to: - Burst suppression pattern - Myoclonic status epilepticus Both of these findings are associated with severe global cerebral dysfunction. Recommend continuous EEG. Su Monks, MD [facility] [phone] If 7pm- 7am, please  page neurology on call as listed in [facility].   [place] EKG SITE RITE  Result Date: 08/27/2022 If Site Rite image not attached, placement could not be confirmed due to current cardiac rhythm.  DG Abd Portable 1V  Result Date: 09/05/2022 CLINICAL DATA:  Orogastric tube placement. EXAM: PORTABLE ABDOMEN - 1 VIEW COMPARISON:  None Available. FINDINGS: Tip and side port of the enteric tube below the diaphragm in the stomach. Moderate stool in the included colon. Nonobstructive upper abdominal bowel gas pattern. IMPRESSION: Tip and side port of the enteric tube below the diaphragm in the stomach. Electronically Signed   By: Keith Rake M.D.   On: 08/25/2022 21:01   CT HEAD WO CONTRAST (5MM)  Result Date: 09/02/2022 CLINICAL DATA:  Seizure EXAM: CT HEAD WITHOUT CONTRAST TECHNIQUE: Contiguous axial images were obtained from the base of the skull through the vertex without intravenous contrast. RADIATION DOSE REDUCTION: This exam was performed according to the departmental dose-optimization program which includes automated exposure control, adjustment of the mA and/or kV according to patient size and/or use of iterative reconstruction technique. COMPARISON:  02/19/2022 FINDINGS: Brain: No acute infarct or hemorrhage. Lateral ventricles and midline structures are unremarkable. No acute extra-axial fluid collections. No mass effect.  Vascular: No hyperdense vessel or unexpected calcification. Skull: Normal. Negative for fracture or focal lesion. Sinuses/Orbits: No acute finding. Other: None. IMPRESSION: 1. No acute intracranial process. Electronically Signed   By: Randa Ngo M.D.   On: 09/04/2022 20:27   DG Chest Portable 1 View  Result Date: 09/02/2022 CLINICAL DATA:  Intubated, status post CPR EXAM: PORTABLE CHEST 1 VIEW COMPARISON:  11/29/2017 FINDINGS: Single frontal view of the chest demonstrates endotracheal tube overlying tracheal air column, tip approximately 1.5 cm above carina. External defibrillator pads overlie the chest. The cardiac silhouette is enlarged. There is diffuse interstitial and ground-glass opacities, favor edema. No effusion or pneumothorax. No displaced fractures. IMPRESSION: 1. Diffuse interstitial and ground-glass opacities, favor edema. 2. No complication after intubation. Electronically Signed   By: Randa Ngo M.D.   On: 08/16/2022 19:11    Microbiology Recent Results (from the past 240 hour(s))  SARS Coronavirus 2 by RT PCR (hospital order, performed in [facility] hospital lab) *cepheid single result test* Anterior Nasal Swab     Status: None   Collection Time: 09/04/22  3:00 PM   Specimen: Anterior Nasal Swab  Result Value Ref Range Status   SARS Coronavirus 2 by RT PCR NEGATIVE NEGATIVE Final    Comment: (NOTE) SARS-CoV-2 target nucleic acids are NOT DETECTED.  The SARS-CoV-2 RNA is generally detectable in upper and lower respiratory specimens during the acute phase of infection. The lowest concentration of SARS-CoV-2 viral copies this assay can detect is 250 copies / mL. A negative result does not preclude SARS-CoV-2 infection and should not be used as the sole basis for treatment or other patient management decisions.  A negative result may occur with improper specimen collection / handling, submission of specimen other than nasopharyngeal swab, presence of viral mutation(s)  within the areas targeted by this assay, and inadequate number of viral copies (<250 copies / mL). A negative result must be combined with clinical observations, patient history, and epidemiological information.  Fact Sheet for Patients:   https://www.patel.info/  Fact Sheet for Healthcare Providers: https://hall.com/  This test is not yet approved or  cleared by the [place] FDA and has been authorized for detection and/or diagnosis of SARS-CoV-2 by FDA under an Emergency Use Authorization (EUA).  This EUA will remain in effect (meaning this test can be used) for the duration of the COVID-19 declaration under Section 564(b)(1) of the Act, 21 U.S.C. section 360bbb-3(b)(1), unless the authorization is terminated or revoked sooner.  Performed at [facility], [place] [address], [place], [place] [address]   Culture, blood (Routine X 2) w Reflex to ID Panel     Status: None (Preliminary result)   Collection Time: 09/04/22  3:39 PM   Specimen: BLOOD LEFT HAND  Result Value Ref Range Status   Specimen Description BLOOD LEFT HAND  Final   Special Requests   Final    BOTTLES DRAWN AEROBIC AND ANAEROBIC Blood Culture results may not be optimal due to an inadequate volume of blood received in culture bottles   Culture   Final    NO GROWTH 3 DAYS Performed at [facility], [place] [address], [place], [place] [address]    Report Status PENDING  Incomplete  Remove and replace urinary cath (placed > 5 days) then obtain urine culture from new indwelling urinary catheter.     Status: Abnormal   Collection Time: 09/04/22  5:06 PM   Specimen: Urine, Catheterized  Result Value Ref Range Status   Specimen Description URINE, CATHETERIZED  Final   Special Requests   Final    Normal Performed at [facility], [address] [place], [place] [address]    Culture >=100,000 COLONIES/mL ESCHERICHIA COLI (A)  Final   Report Status 09/06/2022  FINAL  Final   Organism ID, Bacteria ESCHERICHIA COLI (A)  Final      Susceptibility   Escherichia coli - MIC*    AMPICILLIN >=32 RESISTANT Resistant     CEFAZOLIN <=4 SENSITIVE Sensitive     CEFEPIME <=0.12 SENSITIVE Sensitive     CEFTRIAXONE <=0.25 SENSITIVE Sensitive     CIPROFLOXACIN <=0.25 SENSITIVE Sensitive     GENTAMICIN <=1 SENSITIVE Sensitive     IMIPENEM <=0.25 SENSITIVE Sensitive     NITROFURANTOIN <=16 SENSITIVE Sensitive     TRIMETH/SULFA >=320 RESISTANT Resistant     AMPICILLIN/SULBACTAM 4 SENSITIVE Sensitive     PIP/TAZO <=4 SENSITIVE Sensitive     * >=100,000 COLONIES/mL ESCHERICHIA COLI  SARS Coronavirus 2 by RT PCR (hospital order, performed in [facility] hospital lab) *cepheid single result test* Anterior Nasal Swab     Status: None   Collection Time: 09/06/22  5:43 PM   Specimen: Anterior Nasal Swab  Result Value Ref Range Status   SARS Coronavirus 2 by RT PCR NEGATIVE NEGATIVE Final    Comment: (NOTE) SARS-CoV-2 target nucleic acids are NOT DETECTED.  The SARS-CoV-2 RNA is generally detectable in upper and lower respiratory specimens during the acute phase of infection. The lowest concentration of SARS-CoV-2 viral copies this assay can detect is 250 copies / mL. A negative result does not preclude SARS-CoV-2 infection and should not be used as the sole basis for treatment or other patient management decisions.  A negative result may occur with improper specimen collection / handling, submission of specimen other than nasopharyngeal swab, presence of viral mutation(s) within the areas targeted by this assay, and inadequate number of viral copies (<250 copies / mL). A negative result must be combined with clinical observations, patient history, and epidemiological information.  Fact Sheet for Patients:   https://www.patel.info/  Fact Sheet for Healthcare Providers: https://hall.com/  This test is not yet approved  or  cleared by the [place] and has been authorized  for detection and/or diagnosis of SARS-CoV-2 by FDA under an Emergency Use Authorization (EUA).  This EUA will remain in effect (meaning this test can be used) for the duration of the COVID-19 declaration under Section 564(b)(1) of the Act, 21 U.S.C. section 360bbb-3(b)(1), unless the authorization is terminated or revoked sooner.  Performed at [facility], [place] [address] [place], [place] [address]     Lab Basic Metabolic Panel: Recent Labs  Lab 09/05/22 0800 09/05/22 0810 09/05/22 2351 09/06/22 0002 09/06/22 0739 09/06/22 9476 09/06/22 1604 09/06/22 2259 09/06/22 2314 2022-09-21 0733 09/21/2022 0735 09-21-2022 1528 2022-09-21 1533  NA 139   < >  --    < > 140   < > 145 142 144 140 142 141 140  K 3.7   < >  --    < > 3.9   < > 3.8 3.4* 3.4* 3.9 3.8 3.6 3.7  CL 107   < >  --   --  107  --  109 109  --  108  --   --  109  CO2 25   < >  --   --  25  --  26 27  --  26  --   --  25  GLUCOSE 188*   < >  --   --  230*  --  86 107*  --  133*  --   --  106*  BUN 17   < >  --   --  14  --  14 10  --  10  --   --  11  CREATININE 0.56   < >  --   --  0.55  --  0.42* 0.53  --  0.54  --   --  0.46  CALCIUM 8.4*   < >  --   --  8.4*  --  8.3* 8.4*  --  8.3*  --   --  7.9*  MG 2.0  --  2.0  --  1.9  --   --  1.9  --  2.3  --   --   --   PHOS 4.4  --  3.7  --  4.0  --   --  4.0  --  3.8  --   --   --    < > = values in this interval not displayed.   Liver Function Tests: Recent Labs  Lab 09/06/22 0739 09/06/22 1604 09/06/22 2259 09-21-22 0733 09-21-2022 1533  AST 48* 42* 43* 45* 43*  ALT 34 '31 29 29 23  '$ ALKPHOS 101 100 88 84 89  BILITOT 0.3 0.3 <0.1* 0.4 0.2*  PROT 6.1* 6.0* 6.0* 6.0* 5.5*  ALBUMIN 1.9* 1.9* 1.9* 1.9* 1.8*   No results for input(s): "LIPASE", "AMYLASE" in the last 168 hours. No results for input(s): "AMMONIA" in the last 168 hours. CBC: Recent Labs  Lab 09/06/22 0739 09/06/22 0838 09/06/22 1604  09/06/22 2259 09/06/22 2314 09-21-22 0733 09-21-2022 0735 09/21/2022 1528 2022-09-21 1533  WBC 19.1*  --  19.4* 18.5*  --  17.2*  --   --  15.4*  NEUTROABS 12.0*  --  11.5* 11.2*  --  9.8*  --   --  8.4*  HGB 8.8*   < > 8.2* 8.2* 8.5* 8.1* 8.5* 7.5* 7.5*  HCT 27.1*   < > 25.9* 26.3* 25.0* 24.9* 25.0* 22.0* 24.4*  MCV 83.1  --  83.5 84.8  --  82.5  --   --  85.6  PLT 380  --  394 407*  --  391  --   --  387   < > = values in this interval not displayed.   Cardiac Enzymes: No results for input(s): "CKTOTAL", "CKMB", "CKMBINDEX", "TROPONINI" in the last 168 hours. Sepsis Labs: Recent Labs  Lab 09/06/22 1604 09/06/22 2259 2022-09-12 0733 09/12/22 1533  WBC 19.4* 18.5* 17.2* 15.4*    Procedures/Operations  Intubation Arterial line placement   Julian Hy 09/12/2022, 10:05 PM

## 2022-09-13 NOTE — Anesthesia Preprocedure Evaluation (Deleted)
Anesthesia Evaluation    Reviewed: Patient's Chart, lab work & pertinent test results, Unable to perform ROS - Chart review only  Airway        Dental   Pulmonary Current Smoker          Cardiovascular hypertension,   Echo 08/27/2022  1. Left ventricular ejection fraction, by estimation, is 30 to 35%. The  left ventricle has moderately decreased function. The left ventricle  demonstrates global hypokinesis. There is mild left ventricular  hypertrophy. Left ventricular diastolic  parameters are indeterminate.   2. Right ventricular systolic function is normal. The right ventricular  size is normal. Tricuspid regurgitation signal is inadequate for assessing  PA pressure.   3. The mitral valve is normal in structure. No evidence of mitral valve  regurgitation. No evidence of mitral stenosis.   4. The aortic valve is tricuspid. Aortic valve regurgitation is not  visualized. Aortic valve sclerosis/calcification is present, without any  evidence of aortic stenosis.      Neuro/Psych Seizures -,  CVA    GI/Hepatic   Endo/Other  diabetes    Renal/GU      Musculoskeletal   Abdominal   Peds  Hematology  (+) Blood dyscrasia, anemia   Anesthesia Other Findings   Reproductive/Obstetrics                             Anesthesia Physical Anesthesia Plan  ASA: 6  Anesthesia Plan: General   Post-op Pain Management:    Induction:   PONV Risk Score and Plan:   Airway Management Planned: Oral ETT  Additional Equipment:   Intra-op Plan:   Post-operative Plan:   Informed Consent:   Plan Discussed with:   Anesthesia Plan Comments:        Anesthesia Quick Evaluation

## 2022-09-13 NOTE — OR Nursing (Signed)
Patient's stuffed lamb will be sent with the patient to the morgue.

## 2022-09-13 NOTE — Progress Notes (Signed)
NAME:  Kaylee Murphy, MRN:  [id number], DOB:  Jan 15, 1961, LOS: 12 ADMISSION DATE:  08/14/2022, CONSULTATION DATE:  2022-09-23 REFERRING MD:  Tegeler, CHIEF COMPLAINT:  cardiac arrest   History of Present Illness:  Kaylee Murphy is a 62 y.o. 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  History taken from Epic and report as no family is available; apparently she had seizure-like activity and EMS was called.  Pt was awake, but altered on arrival and then had a syncopal episode with loss of pulses.  Initial rhythm was Vfib. She underwent approximately 50 minutes of CPR with a run of Torsades and was intubated by EMS.   In the ED, ETT changed without sedation, then patient began moving and had purposeful reaching for tube.   At the time of PCCM consult, no labs or head CT available. She initially required an epinephrine gtt, this was weaned off.  EKG without STEMI and Cardiology was also consulted.  Pertinent  Medical History   has a past medical history of Anemia, Anxiety, Chronic back pain, Chronic shoulder pain (10 years), Depression, Diabetes mellitus, Hyperlipidemia, and Hypertension.   Significant Hospital Events: Including procedures, antibiotic start and stop dates in addition to other pertinent events   12/14 witnessed out of hospital vfib arrest, 50 mins CPR 12/17 cont epileptogenicity on EEG.  12/18 weaned off sedation 12/19 night had another sz, put back on versed prop. DNR after family mtg, with plan for comfort in 2d if not significantly improved   12/21 off cEEG, off NE, ongoing family discussions   Interim History / Subjective:  [name].  Some family at bedside. Questions answered. Cuff pressures within goal parameters. VSS.   Objective   Blood pressure (!) 103/59, pulse (!) 56, temperature 98.4 F  (36.9 C), resp. rate (!) 22, height '5\' 5"'$  (1.651 m), weight 73.3 kg, last menstrual period 02/13/2014, SpO2 100 %.    Vent Mode: PRVC FiO2 (%):  [100 %] 100 % Set Rate:  [22 bmp] 22 bmp Vt Set:  [440 mL] 440 mL PEEP:  [10 cmH20] 10 cmH20 Plateau Pressure:  [22 cmH20-25 cmH20] 22 cmH20   Intake/Output Summary (Last 24 hours) at 09-23-2022 1443 Last data filed at Sep 23, 2022 1300 Gross per 24 hour  Intake 3857.87 ml  Output 2750 ml  Net 1107.87 ml    Filed Weights   09/05/22 0300 09/06/22 0457 09/23/2022 0309  Weight: 69.8 kg 73 kg 73.3 kg   General: Critically ill appearing female in NAD on vent HEENT: [place]/AT, no JVD Neuro: Coma. No response. Examined on heavy sedation CV: RRR, no MRG PULM: Clear bilateral breath sounds. Synchronous with the vent GI: Soft, non-distended Extremities: No acute deformity. No significant edema.   Labs reviewed.   Resolved Hospital Problem list     Assessment & Plan:   OO[facility] Vfib arrest Acute hypoxic respiratory failure requiring MV Myoclonic seizures Anoxic brain injury  NSTEMI Ectopy, prolonged QTc  Acute HFrEF Fever, suspect central Leukocytosis, suspect reactive in setting of sz  DM2 Inadequate PO intake Elevated transaminases Elevated triglycerides on propofol  Goals of care DNR status Severe hypertriglyceridemia due to propofol> improved (suspect this was due to drawing upstream from the line. Hypokalemia Anemia -with seizure recurrence during sedation holiday 12/18-19 night, prognosis for meaningful recovery is poor  Kaylee Murphy is a 62 year old female with complex medical history including CVA, HFrEF, and DM. She [facility]15 with ventricular tachycardia cardiac arrest. Downtime estimated to be as much as 50 minutes. The patient unfortunately suffered anoxic brain injury complicated by myoclonic seizures we have not been able to control without high dose sedative infusions. After extensive goals of care discussions  the patient's family has decided to provide comfort care. After discussion with the [facility] organ donation service, the family has decided to proceed with evaluation of organ donation.   P -[name]--we will continue to care for her as they prepare for the operating room this evening for harvest. -Vent at 100% to maintain oxygen.  PEEP 10 -Drop rate to 18 d/t worsening respiratory alkalosis -Lab monitoring per [facility] -Still needs propofol and Versed infusions due to ongoing myoclonus -Continue insulin with goal blood glucose 140-180   Best Practice (right click and "Reselect all SmartList Selections" daily)   Diet/type: TF on hold DVT prophylaxis:  Lovenox once daily GI prophylaxis: PPI Lines: Central line- PICC Foley:  Yes, and it is still needed Code Status:  full code Last date of multidisciplinary goals of care discussion [see previous IPAL discussions 12/22  Georgann Housekeeper, AGACNP-BC [facility] for personal pager PCCM on call pager [phone] until 7pm. Please call Elink 7p-7a. [phone]  09/11/22 4:10 PM

## 2022-09-13 NOTE — Progress Notes (Signed)
1806:  Pt extubated in OR 11. 1826-Pt confirmed asystole on monitor.   1831-No apical pulse ausculted by this RN and Elmon Kirschner RN.

## 2022-09-13 DEATH — deceased
